# Patient Record
Sex: Female | Born: 1970 | Race: White | Hispanic: No | Marital: Single | Smoking: Current every day smoker
Health system: Southern US, Community
[De-identification: ages and names within clinical notes are randomized; demographics above are authoritative.]

## PROBLEM LIST (undated history)

## (undated) DIAGNOSIS — D739 Disease of spleen, unspecified: Secondary | ICD-10-CM

## (undated) DIAGNOSIS — F419 Anxiety disorder, unspecified: Secondary | ICD-10-CM

## (undated) DIAGNOSIS — R519 Headache, unspecified: Secondary | ICD-10-CM

## (undated) DIAGNOSIS — Z8719 Personal history of other diseases of the digestive system: Secondary | ICD-10-CM

## (undated) DIAGNOSIS — I10 Essential (primary) hypertension: Secondary | ICD-10-CM

## (undated) DIAGNOSIS — G629 Polyneuropathy, unspecified: Secondary | ICD-10-CM

## (undated) DIAGNOSIS — E119 Type 2 diabetes mellitus without complications: Secondary | ICD-10-CM

## (undated) DIAGNOSIS — K529 Noninfective gastroenteritis and colitis, unspecified: Secondary | ICD-10-CM

## (undated) DIAGNOSIS — G5603 Carpal tunnel syndrome, bilateral upper limbs: Secondary | ICD-10-CM

## (undated) DIAGNOSIS — Z8489 Family history of other specified conditions: Secondary | ICD-10-CM

## (undated) DIAGNOSIS — N6091 Unspecified benign mammary dysplasia of right breast: Secondary | ICD-10-CM

## (undated) DIAGNOSIS — E785 Hyperlipidemia, unspecified: Secondary | ICD-10-CM

## (undated) DIAGNOSIS — K611 Rectal abscess: Secondary | ICD-10-CM

## (undated) HISTORY — DX: Hyperlipidemia, unspecified: E78.5

## (undated) HISTORY — PX: INCISION AND DRAINAGE ABSCESS ANAL: SUR669

## (undated) HISTORY — DX: Type 2 diabetes mellitus without complications: E11.9

## (undated) HISTORY — DX: Essential (primary) hypertension: I10

## (undated) HISTORY — PX: WISDOM TOOTH EXTRACTION: SHX21

---

## 2009-02-18 DIAGNOSIS — Z8719 Personal history of other diseases of the digestive system: Secondary | ICD-10-CM

## 2009-02-18 HISTORY — DX: Personal history of other diseases of the digestive system: Z87.19

## 2009-11-10 ENCOUNTER — Ambulatory Visit: Payer: Self-pay | Admitting: Vascular Surgery

## 2010-07-31 ENCOUNTER — Ambulatory Visit
Admission: RE | Admit: 2010-07-31 | Discharge: 2010-07-31 | Disposition: A | Discharge status: Home / Self Care | Payer: BC Managed Care – PPO | Source: Ambulatory Visit | Attending: Family Medicine | Admitting: Family Medicine

## 2010-07-31 ENCOUNTER — Other Ambulatory Visit: Payer: Self-pay | Admitting: Family Medicine

## 2010-07-31 DIAGNOSIS — K921 Melena: Secondary | ICD-10-CM

## 2010-07-31 MED ORDER — IOHEXOL 300 MG/ML  SOLN
125.0000 mL | Freq: Once | INTRAMUSCULAR | Status: AC | PRN
  Administered 2010-07-31: 125 mL via INTRAVENOUS

## 2011-02-07 LAB — HM MAMMOGRAPHY

## 2012-06-17 ENCOUNTER — Encounter: Payer: Self-pay | Admitting: Family Medicine

## 2012-06-17 DIAGNOSIS — K529 Noninfective gastroenteritis and colitis, unspecified: Secondary | ICD-10-CM | POA: Insufficient documentation

## 2012-06-17 DIAGNOSIS — E669 Obesity, unspecified: Secondary | ICD-10-CM | POA: Insufficient documentation

## 2012-06-18 ENCOUNTER — Ambulatory Visit (INDEPENDENT_AMBULATORY_CARE_PROVIDER_SITE_OTHER): Payer: BC Managed Care – PPO | Admitting: Physician Assistant

## 2012-06-18 ENCOUNTER — Encounter: Payer: Self-pay | Admitting: Physician Assistant

## 2012-06-18 VITALS — BP 146/94 | HR 76 | Temp 97.8°F | Resp 18 | Ht 65.0 in | Wt 246.0 lb

## 2012-06-18 DIAGNOSIS — R209 Unspecified disturbances of skin sensation: Secondary | ICD-10-CM

## 2012-06-18 DIAGNOSIS — R202 Paresthesia of skin: Secondary | ICD-10-CM

## 2012-06-18 DIAGNOSIS — G5601 Carpal tunnel syndrome, right upper limb: Secondary | ICD-10-CM

## 2012-06-18 DIAGNOSIS — G5600 Carpal tunnel syndrome, unspecified upper limb: Secondary | ICD-10-CM

## 2012-06-18 DIAGNOSIS — M542 Cervicalgia: Secondary | ICD-10-CM

## 2012-06-18 DIAGNOSIS — B372 Candidiasis of skin and nail: Secondary | ICD-10-CM

## 2012-06-18 LAB — COMPLETE METABOLIC PANEL WITH GFR
ALT: 18 U/L (ref 0–35)
AST: 15 U/L (ref 0–37)
Alkaline Phosphatase: 78 U/L (ref 39–117)
BUN: 9 mg/dL (ref 6–23)
Creat: 0.94 mg/dL (ref 0.50–1.10)
Total Bilirubin: 0.4 mg/dL (ref 0.3–1.2)

## 2012-06-18 LAB — HEMOGLOBIN A1C: Mean Plasma Glucose: 111 mg/dL (ref <117)

## 2012-06-18 LAB — VITAMIN B12: Vitamin B-12: 369 pg/mL (ref 211–911)

## 2012-06-18 MED ORDER — METAXALONE 800 MG PO TABS: 800.0000 mg | ORAL_TABLET | Freq: Four times a day (QID) | ORAL | Status: DC

## 2012-06-18 MED ORDER — FLUCONAZOLE 150 MG PO TABS: 150.0000 mg | ORAL_TABLET | Freq: Once | ORAL | Status: DC

## 2012-06-19 MED ORDER — NYSTATIN 100000 UNIT/GM EX CREA: TOPICAL_CREAM | Freq: Two times a day (BID) | CUTANEOUS | Status: DC

## 2012-06-20 NOTE — Progress Notes (Signed)
Patient ID: Alexandra Christensen MRN: [id number], DOB: [birth year], 42 y.o. Date of Encounter: @DATE @  Chief Complaint:  Chief Complaint  Patient presents with  . c/o shoulder and neck pain  . Rash    under breasts    HPI: 42 y.o. year old female  presents with:  1- Rash under both breasts.   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 High stress-lots of changes, etc. Working lots more.   3-Wonders if she should get breast reduction. Wears 42 DD bra. Has indentions in tops of shoulders. Constant neck and back pain. Cannot jog secondary to pain in breasts with that movement.  4- Weight gain secondary to stress.  5- Numb/tingling in fingers of right hand. Wakes her at night. Feels it when driving, gripping stering wheel. Early morning, has difficulty gripping hair brush or coffee cup.   6-Burning, tingling in toes-Family h/o diabetes. Wants to check for diabetes. Not fasting    History reviewed. No pertinent past medical history.   Home Meds: See attached medication section for current medication list. Any medications entered into computer today will not appear on this note's list. The medications listed below were entered prior to today. No current outpatient prescriptions on file prior to visit.   No current facility-administered medications on file prior to visit.    Allergies: No Known Allergies  History   Social History  . Marital Status: Single    Spouse Name: N/A    Number of Children: N/A  . Years of Education: N/A   Occupational History  . Not on file.   Social History Main Topics  . Smoking status: Current Some Day Smoker  . Smokeless tobacco: Never Used  . Alcohol Use: Yes  . Drug Use: No  . Sexually Active: Not on file   Other Topics Concern  . Not on file   Social History Narrative  . No narrative on file    Family History  Problem Relation Age of Onset  . Diabetes Mother   . Heart disease  Mother   . Kidney disease Father   . Heart disease Father   . Cancer Neg Hx      Review of Systems:  See HPI for pertinent ROS. All other ROS negative.    Physical Exam: Blood pressure 146/94, pulse 76, temperature 97.8 F (36.6 C), temperature source Oral, resp. rate 18, height 5\' 5"  (1.651 m), weight 246 lb (111.585 kg), last menstrual period 06/13/2012., Body mass index is 40.94 kg/(m^2). General: Obese WF.   Appears in no acute distress. Neck: Supple. No thyromegaly. No lymphadenopathy. Tender with palpation of left neck at trapezius. When turns head to right, causes pain, tightness in left neck. Lungs: Clear bilaterally to auscultation without wheezes, rales, or rhonchi. Breathing is unlabored. Heart: RRR with S1 S2. No murmurs, rubs, or gallops. Musculoskeletal:  Strength and tone normal for age.  Extremities/Skin: Bethann Humble der both breasts: diffuse erythema, moist. Breasts are very large. She is wearing support bra but breasts still come down covering about 3 inches of her abdomen. Talens: Negative. Phalens: causes increased tingling in fingers of right hand. Neuro: Alert and oriented X 3. Moves all extremities spontaneously. Gait is normal. CNII-XII grossly in tact. Psych:  Responds to questions appropriately with a normal affect.     ASSESSMENT AND PLAN:  42 y.o. year old female with  1. Skin yeast infection - fluconazole (DIFLUCAN) 150 MG tablet; Take  1 tablet (150 mg total) by mouth once.  Dispense: 1 tablet; Refill: 0 Nystatin cream. Discussed cause. Needs to dry completely fter shower and avoid perspiration to stay t area. Keep area dry.  2. Neck pain Do Rom stretches of neck and shoulders periodically throughout the day. Adjust posture while at computer. Heat.  - metaxalone (SKELAXIN) 800 MG tablet; Take 1 tablet (800 mg total) by mouth 4 (four) times daily.  Dispense: 60 tablet; Refill: 1  3. Paresthesias - Hemoglobin A1c - Vitamin B12 - COMPLETE METABOLIC PANEL WITH  GFR  4. Carpal tunnel syndrome, right Wear splint at night and as much as possible. Nsaids prn.   5. Large Breasts: Told her to contact her insurance company regarding this. I do encourage her to proceed with this. She would benefit from breast reduction surgery.    [address] [place], [place], [facility] 06/20/2012 7:57 AM

## 2012-11-03 ENCOUNTER — Encounter: Payer: Self-pay | Admitting: Family Medicine

## 2013-06-14 ENCOUNTER — Encounter: Payer: Self-pay | Admitting: Family Medicine

## 2013-06-14 ENCOUNTER — Ambulatory Visit (INDEPENDENT_AMBULATORY_CARE_PROVIDER_SITE_OTHER): Payer: BC Managed Care – PPO | Admitting: Family Medicine

## 2013-06-14 VITALS — BP 130/76 | HR 82 | Temp 99.0°F | Resp 16 | Ht 65.0 in | Wt 238.0 lb

## 2013-06-14 DIAGNOSIS — K529 Noninfective gastroenteritis and colitis, unspecified: Secondary | ICD-10-CM

## 2013-06-14 DIAGNOSIS — R109 Unspecified abdominal pain: Secondary | ICD-10-CM

## 2013-06-14 DIAGNOSIS — K5289 Other specified noninfective gastroenteritis and colitis: Secondary | ICD-10-CM

## 2013-06-14 LAB — URINALYSIS, ROUTINE W REFLEX MICROSCOPIC
BILIRUBIN URINE: NEGATIVE
Glucose, UA: NEGATIVE mg/dL
Hgb urine dipstick: NEGATIVE
KETONES UR: NEGATIVE mg/dL
Leukocytes, UA: NEGATIVE
Nitrite: NEGATIVE
PH: 6 (ref 5.0–8.0)
Protein, ur: NEGATIVE mg/dL
Urobilinogen, UA: 0.2 mg/dL (ref 0.0–1.0)

## 2013-06-14 MED ORDER — HYDROCODONE-ACETAMINOPHEN 5-325 MG PO TABS: 1.0000 | ORAL_TABLET | Freq: Four times a day (QID) | ORAL | Status: DC | PRN

## 2013-06-14 NOTE — Progress Notes (Signed)
Patient ID: Alexandra Christensen, female   DOB: [birth year], 43 y.o.   MRN: [id number]   Subjective:    Patient ID: Alexandra Christensen, female    DOB: [birth year], 43 y.o.   MRN: [id number]  Patient presents for Abdominal pain  patient here with abdominal pain for the past week. She does have a history of inflammatory bowel disease/colitis which she last had a flareup of 2012. She's not had any change in her stools but has had left-sided abdominal pain associated with nausea and fever. She's not had any URI symptoms no sinusitis symptoms. Denies any dysuria. She has tried some over-the-counter pain relievers with no improvement.    Review Of Systems:  GEN- denies fatigue, fever, weight loss,weakness, recent illness HEENT- denies eye drainage, change in vision, nasal discharge, CVS- denies chest pain, palpitations RESP- denies SOB, cough, wheeze ABD-+ N/ denies V, change in stools, +abd pain GU- denies dysuria, hematuria, dribbling, incontinence MSK- denies joint pain, muscle aches, injury Neuro- denies headache, dizziness, syncope, seizure activity       Objective:    BP 130/76  Pulse 82  Temp(Src) 99 F (37.2 C) (Oral)  Resp 16  Ht 5\' 5"  (1.651 m)  Wt 238 lb (107.956 kg)  BMI 39.61 kg/m2  LMP 06/03/2013 GEN- NAD, alert and oriented x3 HEENT- PERRL, EOMI, non injected sclera, pink conjunctiva, MMM, oropharynx clear Neck- Supple, no LAD CVS- RRR, no murmur RESP-CTAB ABD-NABS,soft,TTP LUQ/upper LLQ, no rebound, no gaurding, no mass, no CVA tenderness EXT- No edema Pulses- Radial 2+        Assessment & Plan:      Problem List Items Addressed This Visit   Abdominal pain, unspecified site - Primary   Relevant Orders      CBC with Differential      Comprehensive metabolic panel      Urinalysis, Routine w reflex microscopic (Completed)      CT Abdomen Pelvis W Contrast    Other Visit Diagnoses   IBD (inflammatory bowel disease)        Relevant Orders       CT Abdomen Pelvis  W Contrast       Note: This dictation was prepared with Dragon dictation along with smaller phrase technology. Any transcriptional errors that result from this process are unintentional.

## 2013-06-14 NOTE — Assessment & Plan Note (Signed)
Her abdominal pain and fever very concerning and she has a history of colitis and inflammatory bowel disease. She's not had any other signs or sources of infection. I did check a urinalysis which was negative CBC metabolic panel to be done and CT scan of abdomen and pelvis. Have given her prescription of hydrocodone to use for pain.

## 2013-06-14 NOTE — Patient Instructions (Signed)
Take pain medication as needed We will call with results F/U as needed

## 2013-06-14 NOTE — Assessment & Plan Note (Signed)
History of IBD, no recent diarrhea or blood stools

## 2013-06-15 ENCOUNTER — Other Ambulatory Visit: Payer: Self-pay | Admitting: Family Medicine

## 2013-06-15 ENCOUNTER — Ambulatory Visit
Admission: RE | Admit: 2013-06-15 | Discharge: 2013-06-15 | Disposition: A | Discharge status: Home / Self Care | Payer: BC Managed Care – PPO | Source: Ambulatory Visit | Attending: Family Medicine | Admitting: Family Medicine

## 2013-06-15 ENCOUNTER — Telehealth: Payer: Self-pay | Admitting: *Deleted

## 2013-06-15 ENCOUNTER — Other Ambulatory Visit: Payer: BC Managed Care – PPO

## 2013-06-15 DIAGNOSIS — K589 Irritable bowel syndrome without diarrhea: Secondary | ICD-10-CM

## 2013-06-15 DIAGNOSIS — R109 Unspecified abdominal pain: Secondary | ICD-10-CM

## 2013-06-15 DIAGNOSIS — D735 Infarction of spleen: Secondary | ICD-10-CM

## 2013-06-15 DIAGNOSIS — K529 Noninfective gastroenteritis and colitis, unspecified: Secondary | ICD-10-CM

## 2013-06-15 LAB — COMPREHENSIVE METABOLIC PANEL
ALBUMIN: 3.7 g/dL (ref 3.5–5.2)
ALK PHOS: 81 U/L (ref 39–117)
ALT: 9 U/L (ref 0–35)
AST: 9 U/L (ref 0–37)
BUN: 8 mg/dL (ref 6–23)
CO2: 25 mEq/L (ref 19–32)
Calcium: 9.5 mg/dL (ref 8.4–10.5)
Chloride: 103 mEq/L (ref 96–112)
Creat: 0.95 mg/dL (ref 0.50–1.10)
Glucose, Bld: 121 mg/dL — ABNORMAL HIGH (ref 70–99)
POTASSIUM: 4 meq/L (ref 3.5–5.3)
Sodium: 139 mEq/L (ref 135–145)
Total Bilirubin: 0.4 mg/dL (ref 0.2–1.2)
Total Protein: 6.5 g/dL (ref 6.0–8.3)

## 2013-06-15 LAB — CBC WITH DIFFERENTIAL/PLATELET
BASOS ABS: 0 10*3/uL (ref 0.0–0.1)
BASOS PCT: 0 % (ref 0–1)
Eosinophils Absolute: 0.3 10*3/uL (ref 0.0–0.7)
Eosinophils Relative: 2 % (ref 0–5)
HEMATOCRIT: 39.7 % (ref 36.0–46.0)
Hemoglobin: 13.8 g/dL (ref 12.0–15.0)
Lymphocytes Relative: 18 % (ref 12–46)
Lymphs Abs: 2.5 10*3/uL (ref 0.7–4.0)
MCH: 30.1 pg (ref 26.0–34.0)
MCHC: 34.8 g/dL (ref 30.0–36.0)
MCV: 86.5 fL (ref 78.0–100.0)
MONO ABS: 0.7 10*3/uL (ref 0.1–1.0)
Monocytes Relative: 5 % (ref 3–12)
NEUTROS ABS: 10.3 10*3/uL — AB (ref 1.7–7.7)
NEUTROS PCT: 75 % (ref 43–77)
PLATELETS: 375 10*3/uL (ref 150–400)
RBC: 4.59 MIL/uL (ref 3.87–5.11)
RDW: 14.5 % (ref 11.5–15.5)
WBC: 13.7 10*3/uL — ABNORMAL HIGH (ref 4.0–10.5)

## 2013-06-15 MED ORDER — IOHEXOL 300 MG/ML  SOLN
125.0000 mL | Freq: Once | INTRAMUSCULAR | Status: AC | PRN
  Administered 2013-06-15: 125 mL via INTRAVENOUS

## 2013-06-15 NOTE — Telephone Encounter (Signed)
[facility] [place] [facility]. [phone])  Dr. 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 Dr. Rennis GoldenHilty is scheduled to do on 4/29 at 10:00 am.  Pt notified and given instructions. [facility] with date and time of procedure.

## 2013-06-16 ENCOUNTER — Encounter (HOSPITAL_COMMUNITY)
Admission: RE | Disposition: A | Discharge status: Home / Self Care | Payer: Self-pay | Source: Ambulatory Visit | Attending: Internal Medicine

## 2013-06-16 ENCOUNTER — Other Ambulatory Visit: Payer: Self-pay | Admitting: *Deleted

## 2013-06-16 ENCOUNTER — Encounter (HOSPITAL_COMMUNITY): Payer: Self-pay

## 2013-06-16 ENCOUNTER — Ambulatory Visit (HOSPITAL_COMMUNITY)
Admission: RE | Admit: 2013-06-16 | Discharge: 2013-06-16 | Disposition: A | Discharge status: Home / Self Care | Payer: BC Managed Care – PPO | Source: Ambulatory Visit | Attending: Internal Medicine | Admitting: Internal Medicine

## 2013-06-16 DIAGNOSIS — D7389 Other diseases of spleen: Secondary | ICD-10-CM | POA: Insufficient documentation

## 2013-06-16 DIAGNOSIS — Z01818 Encounter for other preprocedural examination: Secondary | ICD-10-CM

## 2013-06-16 DIAGNOSIS — D735 Infarction of spleen: Secondary | ICD-10-CM | POA: Diagnosis present

## 2013-06-16 DIAGNOSIS — I059 Rheumatic mitral valve disease, unspecified: Secondary | ICD-10-CM

## 2013-06-16 HISTORY — PX: TEE WITHOUT CARDIOVERSION: SHX5443

## 2013-06-16 LAB — PATHOLOGIST SMEAR REVIEW

## 2013-06-16 LAB — PROTIME-INR
INR: 1.02 (ref <1.50)
PROTHROMBIN TIME: 13.3 s (ref 11.6–15.2)

## 2013-06-16 SURGERY — ECHOCARDIOGRAM, TRANSESOPHAGEAL
Anesthesia: Moderate Sedation

## 2013-06-16 MED ORDER — FENTANYL CITRATE 0.05 MG/ML IJ SOLN
INTRAMUSCULAR | Status: DC | PRN
  Administered 2013-06-16 (×2): 25 ug via INTRAVENOUS

## 2013-06-16 MED ORDER — LIDOCAINE VISCOUS 2 % MT SOLN
OROMUCOSAL | Status: AC
  Filled 2013-06-16: qty 15

## 2013-06-16 MED ORDER — SODIUM CHLORIDE 0.9 % IV SOLN: INTRAVENOUS | Status: DC

## 2013-06-16 MED ORDER — LIDOCAINE VISCOUS 2 % MT SOLN
OROMUCOSAL | Status: DC | PRN
  Administered 2013-06-16: 10 mL via OROMUCOSAL

## 2013-06-16 MED ORDER — MIDAZOLAM HCL 10 MG/2ML IJ SOLN
INTRAMUSCULAR | Status: DC | PRN
  Administered 2013-06-16: 1 mg via INTRAVENOUS
  Administered 2013-06-16: 2 mg via INTRAVENOUS
  Administered 2013-06-16 (×2): 1 mg via INTRAVENOUS

## 2013-06-16 MED ORDER — MIDAZOLAM HCL 5 MG/ML IJ SOLN
INTRAMUSCULAR | Status: AC
  Filled 2013-06-16: qty 2

## 2013-06-16 MED ORDER — FENTANYL CITRATE 0.05 MG/ML IJ SOLN
INTRAMUSCULAR | Status: AC
  Filled 2013-06-16: qty 2

## 2013-06-16 NOTE — Discharge Instructions (Signed)
Transesophageal Echocardiography °Transesophageal echocardiography (TEE) is a special type of test that produces images of the heart by using sound waves (echocardiogram). This type of echocardiography can obtain better images of the heart than standard echocardiography. TEE is done by passing a flexible tube down the esophagus. The heart is located in front of the esophagus. Because the heart and esophagus are close to one another, your health care provider can take very clear, detailed pictures of the heart via ultrasound waves. °TEE may be done: °· If your health care provider needs more information based on standard echocardiography findings. °· If you had a stroke. This might have happened because a clot formed in your heart. TEE can visualize different areas of the heart and check for clots. °· To check valve anatomy and function. °· To check for infection on the inside of your heart (endocarditis). °· To evaluate the dividing wall (septum) of the heart and presence of a hole that did not close after birth (patent foramen ovale or atrial septal defect). °· To help diagnose a tear in the wall of the aorta (aortic dissection). °· During cardiac valve surgery. This allows the surgeon to assess the valve repair before closing the chest. °· During a variety of other cardiac procedures to guide positioning of catheters. °· Sometimes before a cardioversion, which is a shock to convert heart rhythm back to normal. °LET YOUR HEALTH CARE PROVIDER KNOW ABOUT:  °· Any allergies you have. °· All medicines you are taking, including vitamins, herbs, eye drops, creams, and over-the-counter medicines. °· Previous problems you or members of your family have had with the use of anesthetics. °· Any blood disorders you have. °· Previous surgeries you have had. °· Medical conditions you have. °· Swallowing difficulties. °· An esophageal obstruction. °RISKS AND COMPLICATIONS  °Generally, TEE is a safe procedure. However, as with any  procedure, complications can occur. Possible complications include an esophageal tear (rupture). °BEFORE THE PROCEDURE  °· Do not eat or drink for 6 hours before the procedure or as directed by your health care provider. °· Arrange for someone to drive you home after the procedure. Do not drive yourself home. During the procedure, you will be given medicines that can continue to make you feel drowsy and can impair your reflexes. °· An IV access tube will be started in the arm. °PROCEDURE  °· A medicine to help you relax (sedative) will be given through the IV access tube. °· A medicine that numbs the area (local anesthetic) may be sprayed in the back of the throat. °· Your blood pressure, heart rate, and breathing (vital signs) will be monitored during the procedure. °· The TEE probe is a long, flexible tube. The tip of the probe is placed into the back of the mouth, and you will be asked to swallow. This helps to pass the tip of the probe into the esophagus. Once the tip of the probe is in the correct area, your health care provider can take pictures of the heart. °· TEE is usually not a painful procedure. You may feel the probe press against the back of the throat. The probe does not enter the trachea and does not affect your breathing. °· Your time spent at the hospital is usually less than 2 hours. °AFTER THE PROCEDURE  °· You will be in bed, resting, until you have fully returned to consciousness. °· When you first awaken, your throat may feel slightly sore and will probably still feel numb. This will   improve slowly over time.  You will not be allowed to eat or drink until it is clear that the numbness has improved.  Once you have been able to drink, urinate, and sit on the edge of the bed without feeling sick to your stomach (nausea) or dizzy, you may be cleared to go home.  You should have a friend or family member with you for the next 24 hours after your procedure. Document Released: 04/27/2002  Document Revised: 11/25/2012 Document Reviewed: 08/06/2012 [facility] Patient Information 2014 [place], [place]. Conscious Sedation, Adult, Care After Refer to this sheet in the next few weeks. These instructions provide you with information on caring for yourself after your procedure. Your health care provider may also give you more specific instructions. Your treatment has been planned according to current medical practices, but problems sometimes occur. Call your health care provider if you have any problems or questions after your procedure. WHAT TO EXPECT AFTER THE PROCEDURE  After your procedure:  You may feel sleepy, clumsy, and have poor balance for several hours.  Vomiting may occur if you eat too soon after the procedure. HOME CARE INSTRUCTIONS  Do not participate in any activities where you could become injured for at least 24 hours. Do not:  Drive.  Swim.  Ride a bicycle.  Operate heavy machinery.  Cook.  Use power tools.  Climb ladders.  Work from a high place.  Do not make important decisions or sign legal documents until you are improved.  If you vomit, drink water, juice, or soup when you can drink without vomiting. Make sure you have little or no nausea before eating solid foods.  Only take over-the-counter or prescription medicines for pain, discomfort, or fever as directed by your health care provider.  Make sure you and your family fully understand everything about the medicines given to you, including what side effects may occur.  You should not drink alcohol, take sleeping pills, or take medicines that cause drowsiness for at least 24 hours.  If you smoke, do not smoke without supervision.  If you are feeling better, you may resume normal activities 24 hours after you were sedated.  Keep all appointments with your health care provider. SEEK MEDICAL CARE IF:  Your skin is pale or bluish in color.  You continue to feel nauseous or vomit.  Your pain  is getting worse and is not helped by medicine.  You have bleeding or swelling.  You are still sleepy or feeling clumsy after 24 hours. SEEK IMMEDIATE MEDICAL CARE IF:  You develop a rash.  You have difficulty breathing.  You develop any type of allergic problem.  You have a fever. MAKE SURE YOU:  Understand these instructions.  Will watch your condition.  Will get help right away if you are not doing well or get worse. Document Released: 11/25/2012 Document Reviewed: 09/11/2012 [facility] Patient Information 2014 [place], [place].

## 2013-06-16 NOTE — H&P (Signed)
     INTERVAL PROCEDURE H&P  History and Physical Interval Note:  06/16/2013 10:04 AM  Alexandra Christensen has presented today for their planned procedure. The various methods of treatment have been discussed with the patient and family. After consideration of risks, benefits and other options for treatment, the patient has consented to the procedure.  The patients' outpatient history has been reviewed, patient examined, and no change in status from most recent office note within the past 30 days. I have reviewed the patients' chart and labs and will proceed as planned. Questions were answered to the patient's satisfaction.   Alexandra NoseKenneth C. Taiten Brawn, MD, [facility] Attending Cardiologist [facility]  Alexandra NoseKenneth C. Alexandra Christensen 06/16/2013, 10:04 AM

## 2013-06-16 NOTE — Progress Notes (Signed)
  Echocardiogram Echocardiogram Transesophageal has been performed.  [name] 06/16/2013, 11:01 AM

## 2013-06-16 NOTE — CV Procedure (Signed)
    TRANSESOPHAGEAL ECHOCARDIOGRAM (TEE) NOTE  INDICATIONS: splenic infarct, evaluate for source of embolism  PROCEDURE:   Informed consent was obtained prior to the procedure. The risks, benefits and alternatives for the procedure were discussed and the patient comprehended these risks.  Risks include, but are not limited to, cough, sore throat, vomiting, nausea, somnolence, esophageal and stomach trauma or perforation, bleeding, low blood pressure, aspiration, pneumonia, infection, trauma to the teeth and death.    After a procedural time-out, the patient was given 6 mg versed and 50 mcg fentanyl for moderate sedation.  The oropharynx was anesthetized 10 cc of topical 1% viscous lidocaine and 1 cetacaine spray.  The transesophageal probe was inserted in the esophagus and stomach without difficulty and multiple views were obtained.  The patient was kept under observation until the patient left the procedure room.  The patient left the procedure room in stable condition.   Agitated microbubble saline contrast was administered.  COMPLICATIONS:    There were no immediate complications.  Findings:  1. LEFT VENTRICLE: The left ventricular wall thickness is normal.  The left ventricular cavity is normal in size. Wall motion is normal.  LVEF is 55-60%.  2. RIGHT VENTRICLE:  The right ventricle is normal in structure and function without any thrombus or masses.    3. LEFT ATRIUM:  The left atrium is normal in size without any thrombus or masses.  There is not spontaneous echo contrast ("smoke") in the left atrium consistent with a low flow state.  4. LEFT ATRIAL APPENDAGE:  The left atrial appendage is free of any thrombus or masses. The appendage has single lobes. Pulse doppler indicates moderate flow in the appendage.  5. ATRIAL SEPTUM:  The atrial septum appears intact and is free of thrombus and/or masses.  There is no evidence for interatrial shunting by color doppler and saline  microbubble.  6. RIGHT ATRIUM:  The right atrium is normal in size and function without any thrombus or masses.  7. MITRAL VALVE:  The mitral valve is normal in structure and function with Mild regurgitation.  There were no vegetations or stenosis.  8. AORTIC VALVE:  The aortic valve is normal in structure and function with no regurgitation.  There were no vegetations or stenosis  9. TRICUSPID VALVE:  The tricuspid valve is normal in structure and function with trivial regurgitation.  There were no vegetations or stenosis  10.  PULMONIC VALVE:  The pulmonic valve is normal in structure and function with no regurgitation.  There were no vegetations or stenosis.   11. AORTIC ARCH, ASCENDING AND DESCENDING AORTA:  There was no atherosclerosis of the ascending aorta, aortic arch, or proximal descending aorta.  12.  PULMONARY VEINS: No anomalous pulmonary vein connections were noted.  IMPRESSION:   1.  Normal TEE - no cardiac source of embolus identified.  RECOMMENDATIONS:    1.  Further work-up of splenic infarcts per her PCP.  Time Spent Directly with the Patient:  60 minutes   [name] Liona Wengert, MD, [facility]ACC Attending Cardiologist [facility]  06/16/2013, 10:50 AM

## 2013-06-17 ENCOUNTER — Other Ambulatory Visit: Payer: Self-pay | Admitting: *Deleted

## 2013-06-17 ENCOUNTER — Encounter (HOSPITAL_COMMUNITY): Payer: Self-pay | Admitting: Internal Medicine

## 2013-06-17 ENCOUNTER — Telehealth: Payer: Self-pay | Admitting: *Deleted

## 2013-06-17 DIAGNOSIS — D735 Infarction of spleen: Secondary | ICD-10-CM

## 2013-06-17 DIAGNOSIS — R509 Fever, unspecified: Secondary | ICD-10-CM

## 2013-06-17 LAB — PROTEIN ELECTROPHORESIS, SERUM
ALPHA-2-GLOBULIN: 17.4 % — AB (ref 7.1–11.8)
Albumin ELP: 50 % — ABNORMAL LOW (ref 55.8–66.1)
Alpha-1-Globulin: 6.9 % — ABNORMAL HIGH (ref 2.9–4.9)
Beta 2: 6.3 % (ref 3.2–6.5)
Beta Globulin: 6.1 % (ref 4.7–7.2)
Gamma Globulin: 13.3 % (ref 11.1–18.8)
Total Protein, Serum Electrophoresis: 6.9 g/dL (ref 6.0–8.3)

## 2013-06-17 NOTE — Telephone Encounter (Signed)
Received call from [facility].   Reports that Peer to Peer review of services required for TEE.   Requested to have MD call back with review before case closed on 06/18/2013 @5pm .   Contact number: [phone]

## 2013-06-17 NOTE — Telephone Encounter (Signed)
Call placed to patient and patient made aware.   Orders placed.  

## 2013-06-17 NOTE — Telephone Encounter (Signed)
I have done this from home, Alexandra LeeSabrina has the authorization number  Please call pt, her TEE looked good, no sign of infection around her heart, her blood work is still pending, but I Left off a blood test--- Have her come in and draw Blood Culture x2   Dx- splenic infarct, fever

## 2013-06-18 ENCOUNTER — Other Ambulatory Visit: Payer: BC Managed Care – PPO

## 2013-06-22 ENCOUNTER — Other Ambulatory Visit: Payer: Self-pay | Admitting: Family Medicine

## 2013-06-22 DIAGNOSIS — D735 Infarction of spleen: Secondary | ICD-10-CM

## 2013-06-22 NOTE — Progress Notes (Signed)
Patient ID: Alexandra Christensen, female   DOB: 1970/11/28, 43 y.o.   MRN: [id number] error

## 2013-06-23 ENCOUNTER — Other Ambulatory Visit: Payer: Self-pay | Admitting: *Deleted

## 2013-06-23 ENCOUNTER — Telehealth: Payer: Self-pay | Admitting: Family Medicine

## 2013-06-23 MED ORDER — ALPRAZOLAM 0.5 MG PO TABS: 0.5000 mg | ORAL_TABLET | Freq: Every evening | ORAL | Status: DC | PRN

## 2013-06-23 NOTE — Telephone Encounter (Signed)
Discussed results with pt.  She has 1 fever 99.5 F last week, no LUQ pain now Feels much better Request xanax which she has been on to help her sleep  Referral to hematology to see if she needs anti-agulation She was out of town therefore blood cultures not collected, since she did have another fever will collect when she brings in UPEP sample

## 2013-06-23 NOTE — Telephone Encounter (Signed)
Per orders from MD, xanax prescription sent to pharmacy.

## 2013-06-23 NOTE — Telephone Encounter (Signed)
Please verify Xanax dosage.   [facility] contacted and states that patient has not had medication filled with them.

## 2013-06-25 ENCOUNTER — Ambulatory Visit: Payer: Self-pay | Admitting: Internal Medicine

## 2013-06-30 LAB — CBC CANCER CENTER
BANDS NEUTROPHIL: 1 %
Basophil: 1 %
Eosinophil: 5 %
HCT: 42.8 % (ref 35.0–47.0)
HGB: 14.1 g/dL (ref 12.0–16.0)
Lymphocytes: 20 %
MCH: 29.5 pg (ref 26.0–34.0)
MCHC: 33 g/dL (ref 32.0–36.0)
MCV: 89 fL (ref 80–100)
MONOS PCT: 2 %
PLATELETS: 284 x10 3/mm (ref 150–440)
RBC: 4.79 10*6/uL (ref 3.80–5.20)
RDW: 13.9 % (ref 11.5–14.5)
Segmented Neutrophils: 67 %
VARIANT LYMPHOCYTE - H4-RLYMPH: 4 %
WBC: 11.3 x10 3/mm — AB (ref 3.6–11.0)

## 2013-06-30 LAB — LACTATE DEHYDROGENASE: LDH: 151 U/L (ref 81–246)

## 2013-07-01 LAB — CEA: CEA: 3.1 ng/mL (ref 0.0–4.7)

## 2013-07-01 LAB — CA 125: CA 125: 10.1 U/mL (ref 0.0–34.0)

## 2013-07-01 LAB — CANCER ANTIGEN 19-9: CA 19-9: 4 U/mL (ref 0–35)

## 2013-07-01 LAB — KAPPA/LAMBDA FREE LIGHT CHAINS (ARMC)

## 2013-07-13 ENCOUNTER — Encounter: Payer: Self-pay | Admitting: Family Medicine

## 2013-07-19 ENCOUNTER — Ambulatory Visit: Payer: Self-pay | Admitting: Internal Medicine

## 2013-07-23 ENCOUNTER — Telehealth: Payer: Self-pay | Admitting: Family Medicine

## 2013-07-23 MED ORDER — APIXABAN 2.5 MG PO TABS: 2.5000 mg | ORAL_TABLET | Freq: Two times a day (BID) | ORAL | Status: DC

## 2013-07-23 NOTE — Telephone Encounter (Signed)
Patient returned call and made aware.

## 2013-07-23 NOTE — Telephone Encounter (Signed)
Call pt, I reviewed hematology notes Like we discussed I am going to put her on a low dose blood thinner, called Eliquis, she does not need any lab monitoring with this. Risk include easy bruising and bleeding especially if she injurs her self  I have a co-pay card she can activate to bring cost down to $10/month We will treat for 6 months She will also need repeat CT scan in October to see if splenic infarcts have improved or if anything changes  We have samples of the eliquis to get her started, she can take 1/2 tablet twice a day

## 2013-07-23 NOTE — Telephone Encounter (Signed)
LMTRC

## 2013-10-20 ENCOUNTER — Telehealth: Payer: Self-pay | Admitting: Family Medicine

## 2013-10-20 NOTE — Telephone Encounter (Signed)
[phone]  Pt is suppose to have another CT done in October and she is wanting to know if she needs to schedule that or do we?  IF she needs to schedule this can you please give her the number and who she needs to schedule with.

## 2013-10-22 NOTE — Telephone Encounter (Signed)
LMTRC

## 2013-10-26 NOTE — Telephone Encounter (Signed)
LMTRC

## 2013-10-29 NOTE — Telephone Encounter (Signed)
Pt called back but left message to return call, returned call but no answer left message to return call

## 2013-12-09 ENCOUNTER — Ambulatory Visit: Payer: BC Managed Care – PPO | Admitting: Family Medicine

## 2014-01-06 ENCOUNTER — Telehealth: Payer: Self-pay | Admitting: *Deleted

## 2014-01-06 ENCOUNTER — Other Ambulatory Visit: Payer: Self-pay | Admitting: Family Medicine

## 2014-01-06 DIAGNOSIS — Z09 Encounter for follow-up examination after completed treatment for conditions other than malignant neoplasm: Secondary | ICD-10-CM

## 2014-01-06 DIAGNOSIS — Z8719 Personal history of other diseases of the digestive system: Secondary | ICD-10-CM

## 2014-01-06 NOTE — Telephone Encounter (Signed)
Pt called stating she is needing a follow up CT scan abd/pelvis, states she is due for it and would like for it to be done soon. I placed order for CT scan in referral workqueue

## 2014-01-06 NOTE — Telephone Encounter (Signed)
[facility] needing [facility] to peer review to get authorization to have follow up CT scan abd/pelvis done. Please call the number [phone] and follow the prompts.

## 2014-01-06 NOTE — Telephone Encounter (Signed)
Her last office visit here was with Dr. Jeanice Limurham on 06/14/2013. At that visit, Dr. Jeanice Limurham ordered a CT abdomen pelvis. I reviewed the findings of that CT abdomen pelvis.  Tell patient that she needs to have a follow-up office visit with Dr. Jeanice Limurham. CT scan cna not be ordered until she has a follow-up appointment with Dr. Jeanice Limurham.

## 2014-01-07 NOTE — Telephone Encounter (Signed)
Schedule appt for pt, needs OV first before CT can be ordered

## 2014-01-10 NOTE — Telephone Encounter (Signed)
Tried calling pt no answer, pt voice mal not set up

## 2014-01-10 NOTE — Telephone Encounter (Signed)
Pt coming in for follow up in order to have CT scan done

## 2014-01-11 ENCOUNTER — Ambulatory Visit: Payer: BC Managed Care – PPO

## 2014-01-12 ENCOUNTER — Encounter: Payer: Self-pay | Admitting: Family Medicine

## 2014-01-12 ENCOUNTER — Ambulatory Visit (INDEPENDENT_AMBULATORY_CARE_PROVIDER_SITE_OTHER): Payer: BC Managed Care – PPO | Admitting: Family Medicine

## 2014-01-12 VITALS — BP 128/64 | HR 76 | Temp 98.3°F | Resp 14 | Ht 65.0 in | Wt 257.0 lb

## 2014-01-12 DIAGNOSIS — F5102 Adjustment insomnia: Secondary | ICD-10-CM

## 2014-01-12 DIAGNOSIS — R51 Headache: Secondary | ICD-10-CM

## 2014-01-12 DIAGNOSIS — R519 Headache, unspecified: Secondary | ICD-10-CM

## 2014-01-12 DIAGNOSIS — Z23 Encounter for immunization: Secondary | ICD-10-CM

## 2014-01-12 DIAGNOSIS — D735 Infarction of spleen: Secondary | ICD-10-CM

## 2014-01-12 DIAGNOSIS — E669 Obesity, unspecified: Secondary | ICD-10-CM

## 2014-01-12 MED ORDER — NYSTATIN 100000 UNIT/GM EX CREA: TOPICAL_CREAM | Freq: Two times a day (BID) | CUTANEOUS | Status: DC

## 2014-01-12 MED ORDER — ALPRAZOLAM 1 MG PO TABS: 1.0000 mg | ORAL_TABLET | Freq: Every evening | ORAL | Status: DC | PRN

## 2014-01-12 MED ORDER — IBUPROFEN 800 MG PO TABS: 800.0000 mg | ORAL_TABLET | Freq: Three times a day (TID) | ORAL | Status: DC | PRN

## 2014-01-12 NOTE — Assessment & Plan Note (Signed)
Stress and lack of sleep, more tension related, per above for sleep, ibuprofen 600mg 

## 2014-01-12 NOTE — Patient Instructions (Addendum)
Xanax increased to 1mg  at bedtime Flu shot CT for spleen Fasting for labs next week Take ibuprofen 800mg  F/U 3 months

## 2014-01-12 NOTE — Assessment & Plan Note (Signed)
Discussed weight gain, check TSH and glucose

## 2014-01-12 NOTE — Assessment & Plan Note (Signed)
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

## 2014-01-12 NOTE — Assessment & Plan Note (Signed)
Xanax increased to 1 mg

## 2014-01-12 NOTE — Progress Notes (Signed)
Patient ID: Alexandra Christensen, female   DOB: [birth year], 43 y.o.   MRN: [id number]   Subjective:    Patient ID: Alexandra Stainsandace L Christensen, female    DOB: [birth year], 43 y.o.   MRN: [id number]  Patient presents for F/U  patient here for follow-up. She is due for repeat CT scan of her spleen as she had a splenic infarct of unknown origin. She was seen by hematology with negative workup. She's not had any left upper quadrant pain.  She does complain of fatigue headaches daily she is not relaxing very well she has gained 20 pounds in the past 7 months. She is taking her Xanax 0.5 mg which helps some but wears off she does not sleep very well which contributes to her headaches. She typically takes Tylenol or ibuprofen. She is not exercising like she previously did 4-5 times a week and she has not been watching her diet.    Review Of Systems:  GEN- +fatigue, fever, weight loss,weakness, recent illness HEENT- denies eye drainage, change in vision, nasal discharge, CVS- denies chest pain, palpitations RESP- denies SOB, cough, wheeze ABD- denies N/V, change in stools, abd pain GU- denies dysuria, hematuria, dribbling, incontinence MSK- denies joint pain, muscle aches, injury Neuro- +headache, dizziness, syncope, seizure activity       Objective:    BP 128/64 mmHg  Pulse 76  Temp(Src) 98.3 F (36.8 C) (Oral)  Resp 14  Ht 5\' 5"  (1.651 m)  Wt 257 lb (116.574 kg)  BMI 42.77 kg/m2 GEN- NAD, alert and oriented x3, weight gain  HEENT- PERRL, EOMI, non injected sclera, pink conjunctiva, MMM, oropharynx clear Neck- Supple, no thyromegaly CVS- RRR, no murmur RESP-CTAB ABD-NABS,soft,NT,ND EXT- No edema Psych- normal affect and mood Pulses- Radial 2+        Assessment & Plan:      Problem List Items Addressed This Visit    Splenic infarct - Primary   Obesity    Other Visit Diagnoses    Need for prophylactic vaccination and inoculation against influenza        Relevant Orders       Flu  Vaccine QUAD 36+ mos IM (Completed)       Note: This dictation was prepared with Dragon dictation along with smaller phrase technology. Any transcriptional errors that result from this process are unintentional.

## 2014-01-17 ENCOUNTER — Other Ambulatory Visit: Payer: BC Managed Care – PPO

## 2014-01-17 ENCOUNTER — Other Ambulatory Visit: Payer: Self-pay | Admitting: Family Medicine

## 2014-01-17 LAB — LIPID PANEL
CHOL/HDL RATIO: 5 ratio
CHOLESTEROL: 195 mg/dL (ref 0–200)
HDL: 39 mg/dL — AB (ref >39)
LDL Cholesterol: 104 mg/dL — ABNORMAL HIGH (ref 0–99)
Triglycerides: 262 mg/dL — ABNORMAL HIGH (ref <150)
VLDL: 52 mg/dL — ABNORMAL HIGH (ref 0–40)

## 2014-01-17 LAB — COMPREHENSIVE METABOLIC PANEL
ALBUMIN: 3.7 g/dL (ref 3.5–5.2)
ALT: 15 U/L (ref 0–35)
AST: 12 U/L (ref 0–37)
Alkaline Phosphatase: 71 U/L (ref 39–117)
BUN: 13 mg/dL (ref 6–23)
CALCIUM: 9.3 mg/dL (ref 8.4–10.5)
CO2: 25 meq/L (ref 19–32)
CREATININE: 0.95 mg/dL (ref 0.50–1.10)
Chloride: 104 mEq/L (ref 96–112)
GLUCOSE: 112 mg/dL — AB (ref 70–99)
Potassium: 4.4 mEq/L (ref 3.5–5.3)
Sodium: 140 mEq/L (ref 135–145)
Total Bilirubin: 0.3 mg/dL (ref 0.2–1.2)
Total Protein: 6.3 g/dL (ref 6.0–8.3)

## 2014-01-17 LAB — CBC WITH DIFFERENTIAL/PLATELET
Basophils Absolute: 0 10*3/uL (ref 0.0–0.1)
Basophils Relative: 0 % (ref 0–1)
EOS ABS: 0.5 10*3/uL (ref 0.0–0.7)
Eosinophils Relative: 4 % (ref 0–5)
HEMATOCRIT: 38.8 % (ref 36.0–46.0)
HEMOGLOBIN: 13.5 g/dL (ref 12.0–15.0)
LYMPHS PCT: 21 % (ref 12–46)
Lymphs Abs: 2.4 10*3/uL (ref 0.7–4.0)
MCH: 30.4 pg (ref 26.0–34.0)
MCHC: 34.8 g/dL (ref 30.0–36.0)
MCV: 87.4 fL (ref 78.0–100.0)
MONO ABS: 0.6 10*3/uL (ref 0.1–1.0)
MONOS PCT: 5 % (ref 3–12)
MPV: 10.5 fL (ref 9.4–12.4)
Neutro Abs: 8.1 10*3/uL — ABNORMAL HIGH (ref 1.7–7.7)
Neutrophils Relative %: 70 % (ref 43–77)
PLATELETS: 332 10*3/uL (ref 150–400)
RBC: 4.44 MIL/uL (ref 3.87–5.11)
RDW: 13.9 % (ref 11.5–15.5)
WBC: 11.5 10*3/uL — AB (ref 4.0–10.5)

## 2014-01-17 LAB — TSH: TSH: 2.745 u[IU]/mL (ref 0.350–4.500)

## 2014-01-18 LAB — HEMOGLOBIN A1C
Hgb A1c MFr Bld: 6.2 % — ABNORMAL HIGH (ref <5.7)
Mean Plasma Glucose: 131 mg/dL — ABNORMAL HIGH (ref <117)

## 2014-01-19 ENCOUNTER — Telehealth: Payer: Self-pay | Admitting: Family Medicine

## 2014-01-19 DIAGNOSIS — D735 Infarction of spleen: Secondary | ICD-10-CM

## 2014-01-19 NOTE — Telephone Encounter (Signed)
[facility] called CT order needs to be changed to CT abdomen only with contrast.  That is what insurance is approving.  Approval number [id number] good thru 02/04/14.  Order changed and [facility] aware

## 2014-01-21 ENCOUNTER — Other Ambulatory Visit: Payer: BC Managed Care – PPO

## 2014-01-21 ENCOUNTER — Ambulatory Visit
Admission: RE | Admit: 2014-01-21 | Discharge: 2014-01-21 | Disposition: A | Discharge status: Home / Self Care | Payer: BC Managed Care – PPO | Source: Ambulatory Visit | Attending: Family Medicine | Admitting: Family Medicine

## 2014-01-21 DIAGNOSIS — D735 Infarction of spleen: Secondary | ICD-10-CM

## 2014-01-21 MED ORDER — IOHEXOL 300 MG/ML  SOLN
100.0000 mL | Freq: Once | INTRAMUSCULAR | Status: AC | PRN
  Administered 2014-01-21: 100 mL via INTRAVENOUS

## 2014-01-25 ENCOUNTER — Telehealth: Payer: Self-pay | Admitting: Family Medicine

## 2014-01-25 NOTE — Telephone Encounter (Signed)
Please see Imaging notes.

## 2014-01-25 NOTE — Telephone Encounter (Signed)
Patient is calling to get results of her ct scan done  Please call her at [phone]

## 2014-02-09 ENCOUNTER — Ambulatory Visit: Payer: BC Managed Care – PPO | Admitting: Family Medicine

## 2014-04-15 ENCOUNTER — Encounter: Payer: Self-pay | Admitting: Family Medicine

## 2014-04-15 ENCOUNTER — Ambulatory Visit (INDEPENDENT_AMBULATORY_CARE_PROVIDER_SITE_OTHER): Payer: BC Managed Care – PPO | Admitting: Family Medicine

## 2014-04-15 VITALS — BP 132/80 | HR 72 | Temp 98.1°F | Resp 14 | Ht 65.0 in | Wt 261.0 lb

## 2014-04-15 DIAGNOSIS — E781 Pure hyperglyceridemia: Secondary | ICD-10-CM | POA: Insufficient documentation

## 2014-04-15 DIAGNOSIS — E669 Obesity, unspecified: Secondary | ICD-10-CM

## 2014-04-15 DIAGNOSIS — R7302 Impaired glucose tolerance (oral): Secondary | ICD-10-CM

## 2014-04-15 DIAGNOSIS — F5102 Adjustment insomnia: Secondary | ICD-10-CM

## 2014-04-15 DIAGNOSIS — E1149 Type 2 diabetes mellitus with other diabetic neurological complication: Secondary | ICD-10-CM | POA: Insufficient documentation

## 2014-04-15 NOTE — Progress Notes (Signed)
Patient ID: Alexandra StainsCandace L Christensen, female   DOB: 11-22-1970, 44 y.o.   MRN: [id number]   Subjective:    Patient ID: Alexandra Stainsandace L Christensen, female    DOB: 11-22-1970, 44 y.o.   MRN: [id number]  Patient presents for 3 month F/U  patient here to follow-up medications. She was here for fasting labs but she is one week early. Her last set of lab showed hypertriglyceridemia as well as borderline diabetes. Unfortunately she has not lost any significant weight. She states that she is now dedicated to changes her diet as she does not want to go on medication she also has a family history of diabetes. She's not been exercising on a regular basis to place starting at home workout routine.  Regarding her insomnia she does use her Xanax at bedtime typically 2-3 times a week    Review Of Systems:  GEN- denies fatigue, fever, weight loss,weakness, recent illness HEENT- denies eye drainage, change in vision, nasal discharge, CVS- denies chest pain, palpitations RESP- denies SOB, cough, wheeze ABD- denies N/V, change in stools, abd pain GU- denies dysuria, hematuria, dribbling, incontinence MSK- denies joint pain, muscle aches, injury Neuro- denies headache, dizziness, syncope, seizure activity       Objective:    BP 132/80 mmHg  Pulse 72  Temp(Src) 98.1 F (36.7 C) (Oral)  Resp 14  Ht 5\' 5"  (1.651 m)  Wt 261 lb (118.389 kg)  BMI 43.43 kg/m2  LMP 03/10/2014 (Approximate) GEN- NAD, alert and oriented x3 HEENT- PERRL, EOMI, non injected sclera, pink conjunctiva, MMM, oropharynx clear CVS- RRR, no murmur RESP-CTAB ABD-NABS,soft,NT,ND, No HSM EXT- No edema Pulses- Radial  2+        Assessment & Plan:      Problem List Items Addressed This Visit      Unprioritized   Obesity   Insomnia due to stress   Hypertriglyceridemia - Primary   Glucose intolerance (impaired glucose tolerance)      Note: This dictation was prepared with Dragon dictation along with smaller phrase technology. Any  transcriptional errors that result from this process are unintentional.

## 2014-04-15 NOTE — Assessment & Plan Note (Signed)
Weight is up concerned. She is very high risk for coronary artery disease diabetes mellitus hypertension. She was to change her diet we discussed 1800-calorie diet she was provided with handouts for this as well as for low-cholesterol eating. She will follow-up in 3 months time for her weight. She will have fasting labs done in 1 week

## 2014-04-15 NOTE — Patient Instructions (Addendum)
Return for fasting labs in  1 week F/U 3 months Calorie Counting for Weight Loss Calories are energy you get from the things you eat and drink. Your body uses this energy to keep you going throughout the day. The number of calories you eat affects your weight. When you eat more calories than your body needs, your body stores the extra calories as fat. When you eat fewer calories than your body needs, your body burns fat to get the energy it needs. Calorie counting means keeping track of how many calories you eat and drink each day. If you make sure to eat fewer calories than your body needs, you should lose weight. In order for calorie counting to work, you will need to eat the number of calories that are right for you in a day to lose a healthy amount of weight per week. A healthy amount of weight to lose per week is usually 1-2 lb (0.5-0.9 kg). A dietitian can determine how many calories you need in a day and give you suggestions on how to reach your calorie goal.  WHAT IS MY MY PLAN? My goal is to have __________ calories per day.  If I have this many calories per day, I should lose around __________ pounds per week. WHAT DO I NEED TO KNOW ABOUT CALORIE COUNTING? In order to meet your daily calorie goal, you will need to:  Find out how many calories are in each food you would like to eat. Try to do this before you eat.  Decide how much of the food you can eat.  Write down what you ate and how many calories it had. Doing this is called keeping a food log. WHERE DO I FIND CALORIE INFORMATION? The number of calories in a food can be found on a Nutrition Facts label. Note that all the information on a label is based on a specific serving of the food. If a food does not have a Nutrition Facts label, try to look up the calories online or ask your dietitian for help. HOW DO I DECIDE HOW MUCH TO EAT? To decide how much of the food you can eat, you will need to consider both the number of calories in  one serving and the size of one serving. This information can be found on the Nutrition Facts label. If a food does not have a Nutrition Facts label, look up the information online or ask your dietitian for help. Remember that calories are listed per serving. If you choose to have more than one serving of a food, you will have to multiply the calories per serving by the amount of servings you plan to eat. For example, the label on a package of bread might say that a serving size is 1 slice and that there are 90 calories in a serving. If you eat 1 slice, you will have eaten 90 calories. If you eat 2 slices, you will have eaten 180 calories. HOW DO I KEEP A FOOD LOG? After each meal, record the following information in your food log:  What you ate.  How much of it you ate.  How many calories it had.  Then, add up your calories. Keep your food log near you, such as in a small notebook in your pocket. Another option is to use a mobile app or website. Some programs will calculate calories for you and show you how many calories you have left each time you add an item to the log. WHAT  ARE SOME CALORIE COUNTING TIPS?  Use your calories on foods and drinks that will fill you up and not leave you hungry. Some examples of this include foods like nuts and nut butters, vegetables, lean proteins, and high-fiber foods (more than 5 g fiber per serving).  Eat nutritious foods and avoid empty calories. Empty calories are calories you get from foods or beverages that do not have many nutrients, such as candy and soda. It is better to have a nutritious high-calorie food (such as an avocado) than a food with few nutrients (such as a bag of chips).  Know how many calories are in the foods you eat most often. This way, you do not have to look up how many calories they have each time you eat them.  Look out for foods that may seem like low-calorie foods but are really high-calorie foods, such as baked goods, soda, and  fat-free candy.  Pay attention to calories in drinks. Drinks such as sodas, specialty coffee drinks, alcohol, and juices have a lot of calories yet do not fill you up. Choose low-calorie drinks like water and diet drinks.  Focus your calorie counting efforts on higher calorie items. Logging the calories in a garden salad that contains only vegetables is less important than calculating the calories in a milk shake.  Find a way of tracking calories that works for you. Get creative. Most people who are successful find ways to keep track of how much they eat in a day, even if they do not count every calorie. WHAT ARE SOME PORTION CONTROL TIPS?  Know how many calories are in a serving. This will help you know how many servings of a certain food you can have.  Use a measuring cup to measure serving sizes. This is helpful when you start out. With time, you will be able to estimate serving sizes for some foods.  Take some time to put servings of different foods on your favorite plates, bowls, and cups so you know what a serving looks like.  Try not to eat straight from a bag or box. Doing this can lead to overeating. Put the amount you would like to eat in a cup or on a plate to make sure you are eating the right portion.  Use smaller plates, glasses, and bowls to prevent overeating. This is a quick and easy way to practice portion control. If your plate is smaller, less food can fit on it.  Try not to multitask while eating, such as watching TV or using your computer. If it is time to eat, sit down at a table and enjoy your food. Doing this will help you to start recognizing when you are full. It will also make you more aware of what and how much you are eating. HOW CAN I CALORIE COUNT WHEN EATING OUT?  Ask for smaller portion sizes or child-sized portions.  Consider sharing an entree and sides instead of getting your own entree.  If you get your own entree, eat only half. Ask for a box at the  beginning of your meal and put the rest of your entree in it so you are not tempted to eat it.  Look for the calories on the menu. If calories are listed, choose the lower calorie options.  Choose dishes that include vegetables, fruits, whole grains, low-fat dairy products, and lean protein. Focusing on smart food choices from each of the 5 food groups can help you stay on track at restaurants.  Choose items that are boiled, broiled, grilled, or steamed.  Choose water, milk, unsweetened iced tea, or other drinks without added sugars. If you want an alcoholic beverage, choose a lower calorie option. For example, a regular margarita can have up to 700 calories and a glass of wine has around 150.  Stay away from items that are buttered, battered, fried, or served with cream sauce. Items labeled "crispy" are usually fried, unless stated otherwise.  Ask for dressings, sauces, and syrups on the side. These are usually very high in calories, so do not eat much of them.  Watch out for salads. Many people think salads are a healthy option, but this is often not the case. Many salads come with bacon, fried chicken, lots of cheese, fried chips, and dressing. All of these items have a lot of calories. If you want a salad, choose a garden salad and ask for grilled meats or steak. Ask for the dressing on the side, or ask for olive oil and vinegar or lemon to use as dressing.  Estimate how many servings of a food you are given. For example, a serving of cooked rice is  cup or about the size of half a tennis ball or one cupcake wrapper. Knowing serving sizes will help you be aware of how much food you are eating at restaurants. The list below tells you how big or small some common portion sizes are based on everyday objects.  1 oz--4 stacked dice.  3 oz--1 deck of cards.  1 tsp--1 dice.  1 Tbsp-- a Ping-Pong ball.  2 Tbsp--1 Ping-Pong ball.   cup--1 tennis ball or 1 cupcake wrapper.  1 cup--1  baseball. Document Released: 02/04/2005 Document Revised: 06/21/2013 Document Reviewed: 12/10/2012 [facility] Patient Information 2015 [place], [place]. This information is not intended to replace advice given to you by your health care provider. Make sure you discuss any questions you have with your health care provider.

## 2014-04-15 NOTE — Assessment & Plan Note (Signed)
Per above, discussed diet, labs in 1 week

## 2014-04-22 ENCOUNTER — Other Ambulatory Visit: Payer: BC Managed Care – PPO

## 2014-04-22 DIAGNOSIS — E781 Pure hyperglyceridemia: Secondary | ICD-10-CM

## 2014-04-22 DIAGNOSIS — R7302 Impaired glucose tolerance (oral): Secondary | ICD-10-CM

## 2014-04-22 LAB — CBC WITH DIFFERENTIAL/PLATELET
Basophils Absolute: 0 10*3/uL (ref 0.0–0.1)
Basophils Relative: 0 % (ref 0–1)
EOS ABS: 0.2 10*3/uL (ref 0.0–0.7)
Eosinophils Relative: 3 % (ref 0–5)
HCT: 44 % (ref 36.0–46.0)
Hemoglobin: 14.9 g/dL (ref 12.0–15.0)
Lymphocytes Relative: 30 % (ref 12–46)
Lymphs Abs: 2.3 10*3/uL (ref 0.7–4.0)
MCH: 30.5 pg (ref 26.0–34.0)
MCHC: 33.9 g/dL (ref 30.0–36.0)
MCV: 90 fL (ref 78.0–100.0)
MPV: 11.4 fL (ref 8.6–12.4)
Monocytes Absolute: 0.4 10*3/uL (ref 0.1–1.0)
Monocytes Relative: 5 % (ref 3–12)
NEUTROS ABS: 4.7 10*3/uL (ref 1.7–7.7)
Neutrophils Relative %: 62 % (ref 43–77)
Platelets: 316 10*3/uL (ref 150–400)
RBC: 4.89 MIL/uL (ref 3.87–5.11)
RDW: 14.2 % (ref 11.5–15.5)
WBC: 7.6 10*3/uL (ref 4.0–10.5)

## 2014-04-22 LAB — COMPLETE METABOLIC PANEL WITH GFR
ALBUMIN: 3.7 g/dL (ref 3.5–5.2)
ALK PHOS: 73 U/L (ref 39–117)
ALT: 19 U/L (ref 0–35)
AST: 19 U/L (ref 0–37)
BUN: 10 mg/dL (ref 6–23)
CO2: 23 meq/L (ref 19–32)
Calcium: 9 mg/dL (ref 8.4–10.5)
Chloride: 107 mEq/L (ref 96–112)
Creat: 0.97 mg/dL (ref 0.50–1.10)
GFR, Est African American: 82 mL/min
GFR, Est Non African American: 71 mL/min
GLUCOSE: 123 mg/dL — AB (ref 70–99)
POTASSIUM: 4.7 meq/L (ref 3.5–5.3)
SODIUM: 140 meq/L (ref 135–145)
TOTAL PROTEIN: 6.3 g/dL (ref 6.0–8.3)
Total Bilirubin: 0.4 mg/dL (ref 0.2–1.2)

## 2014-04-22 LAB — LIPID PANEL
CHOL/HDL RATIO: 4.6 ratio
Cholesterol: 179 mg/dL (ref 0–200)
HDL: 39 mg/dL — AB (ref >=46)
LDL CALC: 104 mg/dL — AB (ref 0–99)
Triglycerides: 181 mg/dL — ABNORMAL HIGH (ref <150)
VLDL: 36 mg/dL (ref 0–40)

## 2014-04-23 LAB — HEMOGLOBIN A1C
Hgb A1c MFr Bld: 5.9 % — ABNORMAL HIGH (ref <5.7)
Mean Plasma Glucose: 123 mg/dL — ABNORMAL HIGH (ref <117)

## 2014-04-25 ENCOUNTER — Encounter: Payer: Self-pay | Admitting: *Deleted

## 2014-04-28 ENCOUNTER — Telehealth: Payer: Self-pay | Admitting: Family Medicine

## 2014-04-28 NOTE — Telephone Encounter (Signed)
Patient returned call and made aware.

## 2014-04-28 NOTE — Telephone Encounter (Signed)
Patient calling about lab results  [phone]

## 2014-07-15 ENCOUNTER — Encounter: Payer: Self-pay | Admitting: Family Medicine

## 2014-07-15 ENCOUNTER — Ambulatory Visit (INDEPENDENT_AMBULATORY_CARE_PROVIDER_SITE_OTHER): Payer: BC Managed Care – PPO | Admitting: Family Medicine

## 2014-07-15 VITALS — BP 126/74 | HR 78 | Temp 98.5°F | Resp 16 | Ht 65.0 in | Wt 252.0 lb

## 2014-07-15 DIAGNOSIS — E669 Obesity, unspecified: Secondary | ICD-10-CM | POA: Diagnosis not present

## 2014-07-15 DIAGNOSIS — R7302 Impaired glucose tolerance (oral): Secondary | ICD-10-CM

## 2014-07-15 NOTE — Progress Notes (Signed)
Patient ID: Alexandra StainsCandace L Christensen, female   DOB: Apr 13, 1970, 44 y.o.   MRN: [id number]   Subjective:    Patient ID: Alexandra Christensen, female    DOB: Apr 13, 1970, 44 y.o.   MRN: [id number]  Patient presents for 3 month F/U  Pt here to f/u weight. She is down 10 lbs since our last visit 3 months, she has hypertriglyceridemia and glucose intolerance. No concerns today, feeling well. She has cut out soda, trying to cut out fried foods, still snacks a lot, will have peantuts, crackers, peanut butter, pimento cheese between meals. She is walking 3 days a weeks. [place] [employer] She and husband are trying to loose weight together She is taking DIpalex- Standard Process supplement about 60% of the time    Review Of Systems:  GEN- denies fatigue, fever, weight loss,weakness, recent illness HEENT- denies eye drainage, change in vision, nasal discharge, CVS- denies chest pain, palpitations RESP- denies SOB, cough, wheeze ABD- denies N/V, change in stools, abd pain GU- denies dysuria, hematuria, dribbling, incontinence MSK- denies joint pain, muscle aches, injury Neuro- denies headache, dizziness, syncope, seizure activity       Objective:    BP 126/74 mmHg  Pulse 78  Temp(Src) 98.5 F (36.9 C) (Oral)  Resp 16  Ht 5\' 5"  (1.651 m)  Wt 252 lb (114.306 kg)  BMI 41.93 kg/m2  LMP 07/03/2014 (Approximate) GEN- NAD, alert and oriented x3        Assessment & Plan:      Problem List Items Addressed This Visit    Obesity - Primary   Glucose intolerance (impaired glucose tolerance)    Continue to work on diet and weight loss Will recheck labs in 3 months Cholesterol had improved at last check Discussed low calorie diet- approx 1600 calories, Myfitness PAL APP downloaded during visit Increase exercise to 5 days a week Goal 15lbs off by next visit         Note: This dictation was prepared with Dragon dictation along with smaller phrase technology. Any transcriptional errors that result  from this process are unintentional.

## 2014-07-15 NOTE — Assessment & Plan Note (Addendum)
Discussed weight loss supplements as a last resort I also reviewed the supplement, okay for her to take

## 2014-07-15 NOTE — Patient Instructions (Signed)
Get work up to 5 days  MyfitnessPAL --  Track your calories  Cut out bread 9 unless whole wheat or multivimtain , Ritz crackers,  Eat apples/celery with peanut or almond butter Try Cashews and Almons or healthy nut mix- no salt added  F/U 3 months

## 2014-07-15 NOTE — Assessment & Plan Note (Signed)
Continue to work on diet and weight loss Will recheck labs in 3 months Cholesterol had improved at last check Discussed low calorie diet- approx 1600 calories, Myfitness PAL APP downloaded during visit Increase exercise to 5 days a week Goal 15lbs off by next visit

## 2014-08-30 ENCOUNTER — Telehealth: Payer: Self-pay | Admitting: Family Medicine

## 2014-08-30 NOTE — Telephone Encounter (Signed)
Left knee bothering her for several weeks. Has been treating with ibuprofen and ice.  Not getting better.  Made appt for Thursday

## 2014-08-31 ENCOUNTER — Telehealth: Payer: Self-pay | Admitting: Family Medicine

## 2014-08-31 ENCOUNTER — Other Ambulatory Visit: Payer: Self-pay | Admitting: Family Medicine

## 2014-08-31 MED ORDER — IBUPROFEN 800 MG PO TABS: 800.0000 mg | ORAL_TABLET | Freq: Three times a day (TID) | ORAL | Status: DC | PRN

## 2014-08-31 NOTE — Telephone Encounter (Signed)
Medication called/sent to requested pharmacy  

## 2014-08-31 NOTE — Telephone Encounter (Signed)
Refill changed to pt pharmacy of choice

## 2014-08-31 NOTE — Telephone Encounter (Signed)
[facility] [address] [place]  Patient calling for refill on her 800mg  ibuprofin  She is changing to pharmacy above  [phone]

## 2014-09-01 ENCOUNTER — Ambulatory Visit (INDEPENDENT_AMBULATORY_CARE_PROVIDER_SITE_OTHER): Payer: BC Managed Care – PPO | Admitting: Physician Assistant

## 2014-09-01 ENCOUNTER — Encounter: Payer: Self-pay | Admitting: Physician Assistant

## 2014-09-01 VITALS — BP 132/78 | HR 78 | Temp 97.9°F | Resp 14 | Ht 65.0 in | Wt 257.0 lb

## 2014-09-01 DIAGNOSIS — M25562 Pain in left knee: Secondary | ICD-10-CM

## 2014-09-01 MED ORDER — TRAMADOL HCL 50 MG PO TABS: ORAL_TABLET | ORAL | Status: DC

## 2014-09-01 NOTE — Progress Notes (Signed)
Patient ID: Alexandra Christensen MRN: [id number], DOB: 24-Jan-1971, 44 y.o. Date of Encounter: 09/01/2014, 10:02 AM    Chief Complaint:  Chief Complaint  Patient presents with  . L Knee Pain    x3 weeks- states that she has increased pain that shoots down leg and into shin     HPI: 44 y.o. year old white female presents with above complaint.  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 Says that she is able to work from home. Works at desk all day doing computer work. However was going to the gym and walking on treadmill Monday through Thursday. However has not been able to do this for the past 3 weeks because of this knee pain. Says it was on a weekend that she developed pain in her left knee--- Says it was not while she was exercising or doing any specific activity. Says that she has had no trauma or injury to the knee. Has done no unusual activity--- sits at a desk, walks on treadmill, says that she does mowe her yard with a push mower but no other type of unusual activity.  Says that her left knee has started feeling like it is about to give way so she has been scared to walk on the treadmill anymore. Feels like it could give way any time.  Also she has a throbbing achy pain in the knee at all times for the past 3 weeks--says the only time she does not feel the throbbing pain is when she has had ice on it and it is numb. Says that since July 2 it has been affecting her sleep and she has not been getting good sleep because of this achy discomfort in her knee. She has been taking 2 or 3 ibuprofen per day. Has tried applying heat has tried ice and also elevation.  Has no prior history of problems with this knee or surgery to this knee. Has seen no particular orthopedic.     Home Meds:   Outpatient Prescriptions Prior to Visit  Medication Sig Dispense Refill  . ALPRAZolam (XANAX) 1 MG tablet Take 1 tablet (1 mg total) by mouth at bedtime as needed for anxiety.  30 tablet 3  . ibuprofen (ADVIL,MOTRIN) 800 MG tablet Take 1 tablet (800 mg total) by mouth every 8 (eight) hours as needed. 30 tablet 2  . nystatin cream (MYCOSTATIN) Apply topically 2 (two) times daily. Apply to effected areas twice daily. 30 g 3   No facility-administered medications prior to visit.    Allergies: No Known Allergies    Review of Systems: See HPI for pertinent ROS. All other ROS negative.    Physical Exam: Blood pressure 132/78, pulse 78, temperature 97.9 F (36.6 C), temperature source Oral, resp. rate 14, height  (1.651 m), weight 257 lb (116.574 kg)., Body mass index is 42.77 kg/(m^2). General:  Obese WF. Appears in no acute distress. Neck: Supple. No thyromegaly. No lymphadenopathy. Lungs: Clear bilaterally to auscultation without wheezes, rales, or rhonchi. Breathing is unlabored. Heart: Regular rhythm. No murmurs, rubs, or gallops. Msk:  Strength and tone normal for 44. Left Knee: Very minimal swelling medial aspect of knee. Remainder of inspection is normal. No ecchymosis or erythema warmth etc. There is pain with palpation of the anterior medial aspect of the knee along the knee joint line. This is the area of the most significant pain with palpation. Positive Grind Test. No laxity with valgus or  varus maneuvers. Negative anterior drawer. Extremities/Skin: Warm and dry.  Neuro: Alert and oriented X 3. Moves all extremities spontaneously. Gait is normal. CNII-XII grossly in tact. Psych:  Responds to questions appropriately with a normal affect.     ASSESSMENT AND PLAN:  44 y.o. year old female with  1. Left knee pain Given her history suspect meniscal tear. Will refer to orthopedics for further evaluation. She is agreeable with this approach. In the interim will try tramadol for pain relief. She is to continue to stay off of the leg as much as possible and apply ice. - Ambulatory referral to Orthopedic Surgery - traMADol (ULTRAM) 50 MG tablet; Take 1  - 2 every 8 hours as needed for pain  Dispense: 60 tablet; Refill: 0   Signed, [address] Beth [place], [place], [facility] 09/01/2014 10:02 AM

## 2014-10-17 ENCOUNTER — Ambulatory Visit: Payer: BC Managed Care – PPO | Admitting: Family Medicine

## 2014-11-01 ENCOUNTER — Encounter: Payer: Self-pay | Admitting: Family Medicine

## 2014-11-01 ENCOUNTER — Ambulatory Visit (INDEPENDENT_AMBULATORY_CARE_PROVIDER_SITE_OTHER): Payer: BC Managed Care – PPO | Admitting: Family Medicine

## 2014-11-01 VITALS — BP 134/70 | HR 68 | Temp 98.6°F | Resp 16 | Ht 65.0 in | Wt 253.0 lb

## 2014-11-01 DIAGNOSIS — Z23 Encounter for immunization: Secondary | ICD-10-CM

## 2014-11-01 DIAGNOSIS — F17200 Nicotine dependence, unspecified, uncomplicated: Secondary | ICD-10-CM

## 2014-11-01 DIAGNOSIS — F5102 Adjustment insomnia: Secondary | ICD-10-CM

## 2014-11-01 DIAGNOSIS — Z720 Tobacco use: Secondary | ICD-10-CM | POA: Diagnosis not present

## 2014-11-01 DIAGNOSIS — E669 Obesity, unspecified: Secondary | ICD-10-CM | POA: Diagnosis not present

## 2014-11-01 DIAGNOSIS — E781 Pure hyperglyceridemia: Secondary | ICD-10-CM | POA: Diagnosis not present

## 2014-11-01 DIAGNOSIS — R7302 Impaired glucose tolerance (oral): Secondary | ICD-10-CM

## 2014-11-01 LAB — LIPID PANEL
Cholesterol: 193 mg/dL (ref 125–200)
HDL: 41 mg/dL — ABNORMAL LOW (ref >=46)
LDL Cholesterol: 110 mg/dL (ref <130)
TRIGLYCERIDES: 210 mg/dL — AB (ref <150)
Total CHOL/HDL Ratio: 4.7 Ratio (ref <=5.0)
VLDL: 42 mg/dL — ABNORMAL HIGH (ref <30)

## 2014-11-01 LAB — CBC WITH DIFFERENTIAL/PLATELET
BASOS ABS: 0 10*3/uL (ref 0.0–0.1)
Basophils Relative: 0 % (ref 0–1)
Eosinophils Absolute: 0.3 10*3/uL (ref 0.0–0.7)
Eosinophils Relative: 3 % (ref 0–5)
HEMATOCRIT: 43.7 % (ref 36.0–46.0)
HEMOGLOBIN: 14.9 g/dL (ref 12.0–15.0)
LYMPHS PCT: 26 % (ref 12–46)
Lymphs Abs: 2.7 10*3/uL (ref 0.7–4.0)
MCH: 31 pg (ref 26.0–34.0)
MCHC: 34.1 g/dL (ref 30.0–36.0)
MCV: 90.9 fL (ref 78.0–100.0)
MPV: 11.4 fL (ref 8.6–12.4)
Monocytes Absolute: 0.4 10*3/uL (ref 0.1–1.0)
Monocytes Relative: 4 % (ref 3–12)
NEUTROS ABS: 7 10*3/uL (ref 1.7–7.7)
Neutrophils Relative %: 67 % (ref 43–77)
Platelets: 335 10*3/uL (ref 150–400)
RBC: 4.81 MIL/uL (ref 3.87–5.11)
RDW: 14.1 % (ref 11.5–15.5)
WBC: 10.4 10*3/uL (ref 4.0–10.5)

## 2014-11-01 LAB — COMPREHENSIVE METABOLIC PANEL
ALBUMIN: 3.9 g/dL (ref 3.6–5.1)
ALK PHOS: 84 U/L (ref 33–115)
ALT: 18 U/L (ref 6–29)
AST: 14 U/L (ref 10–35)
BILIRUBIN TOTAL: 0.5 mg/dL (ref 0.2–1.2)
BUN: 12 mg/dL (ref 7–25)
CALCIUM: 9 mg/dL (ref 8.6–10.2)
CO2: 22 mmol/L (ref 20–31)
Chloride: 103 mmol/L (ref 98–110)
Creat: 0.81 mg/dL (ref 0.50–1.10)
GLUCOSE: 105 mg/dL — AB (ref 70–99)
Potassium: 4.4 mmol/L (ref 3.5–5.3)
Sodium: 139 mmol/L (ref 135–146)
Total Protein: 6.5 g/dL (ref 6.1–8.1)

## 2014-11-01 LAB — HEMOGLOBIN A1C
HEMOGLOBIN A1C: 6 % — AB (ref <5.7)
Mean Plasma Glucose: 126 mg/dL — ABNORMAL HIGH (ref <117)

## 2014-11-01 MED ORDER — ALPRAZOLAM 1 MG PO TABS: 1.0000 mg | ORAL_TABLET | Freq: Every evening | ORAL | Status: DC | PRN

## 2014-11-01 MED ORDER — BUPROPION HCL ER (SMOKING DET) 150 MG PO TB12: 150.0000 mg | ORAL_TABLET | Freq: Two times a day (BID) | ORAL | Status: DC

## 2014-11-01 MED ORDER — IBUPROFEN 800 MG PO TABS: 800.0000 mg | ORAL_TABLET | Freq: Three times a day (TID) | ORAL | Status: DC | PRN

## 2014-11-01 NOTE — Assessment & Plan Note (Signed)
Continue xanax 

## 2014-11-01 NOTE — Assessment & Plan Note (Signed)
Her risk factors for heart disease and stroke are her obesity and her smoking. Continue to encourage low-carb diet healthcare eating. He is also getting back to exercising.

## 2014-11-01 NOTE — Assessment & Plan Note (Signed)
Recheck A1c along with fasting labs.

## 2014-11-01 NOTE — Patient Instructions (Signed)
Flu shot given Try the wellbutrin for smoking- start with 1 in the morning 3 days then twice a day We will call with lab results Pneumonia vaccine given F/U 4 months

## 2014-11-01 NOTE — Assessment & Plan Note (Signed)
Trial of wellbutrin, could not afford Chantix

## 2014-11-01 NOTE — Progress Notes (Signed)
Patient ID: Alexandra Christensen, female   DOB: [birth year], 44 y.o.   MRN: [id number]   Subjective:    Patient ID: Alexandra Christensen, female    DOB: [birth year], 44 y.o.   MRN: [id number]  Patient presents for 3 month F/U  here to follow-up chronic medical problems. She has no particular concerns today. She actually gained about 5 pounds after her last visit and is down drop down for of those pounds. She states that she had some stressors recently where she was overeating. Her sister who is 88 and very healthy had a recent stroke and they cannot find a cause of this. She is now looking at her own health it is serious about getting things on track including her smoking in her weight. Medications were reviewed.  she will like to try something to help her quit smoking.   Review Of Systems:  GEN- denies fatigue, fever, weight loss,weakness, recent illness HEENT- denies eye drainage, change in vision, nasal discharge, CVS- denies chest pain, palpitations RESP- denies SOB, cough, wheeze ABD- denies N/V, change in stools, abd pain GU- denies dysuria, hematuria, dribbling, incontinence MSK- denies joint pain, muscle aches, injury Neuro- denies headache, dizziness, syncope, seizure activity       Objective:    BP 134/70 mmHg  Pulse 68  Temp(Src) 98.6 F (37 C) (Oral)  Resp 16  Ht  (1.651 m)  Wt 253 lb (114.76 kg)  BMI 42.10 kg/m2  LMP 10/16/2014 (Approximate) GEN- NAD, alert and oriented x3, obese  HEENT- PERRL, EOMI, non injected sclera, pink conjunctiva, MMM, oropharynx clear Neck- Supple, no thyromegaly CVS- RRR, no murmur RESP-CTAB Psych- normal affect and mood EXT- No edema Pulses- Radial, DP- 2+        Assessment & Plan:      Problem List Items Addressed This Visit    Obesity   Hypertriglyceridemia   Relevant Orders   Lipid panel   Glucose intolerance (impaired glucose tolerance) - Primary   Relevant Orders   CBC with Differential/Platelet   Comprehensive  metabolic panel   Hemoglobin A1c    Other Visit Diagnoses    Need for prophylactic vaccination and inoculation against influenza        Relevant Orders    Flu Vaccine QUAD 36+ mos PF IM (Fluarix & Fluzone Quad PF) (Completed)    Need for prophylactic vaccination against Streptococcus pneumoniae (pneumococcus)        Relevant Orders    Pneumococcal polysaccharide vaccine 23-valent greater than or equal to 2yo subcutaneous/IM (Completed)       Note: This dictation was prepared with Dragon dictation along with smaller phrase technology. Any transcriptional errors that result from this process are unintentional.

## 2014-11-03 ENCOUNTER — Telehealth: Payer: Self-pay | Admitting: *Deleted

## 2014-11-03 ENCOUNTER — Other Ambulatory Visit: Payer: Self-pay | Admitting: *Deleted

## 2014-11-03 MED ORDER — SIMVASTATIN 20 MG PO TABS: 20.0000 mg | ORAL_TABLET | Freq: Every day | ORAL | Status: DC

## 2014-11-03 NOTE — Telephone Encounter (Signed)
Received call from patient.   States that she had discussed weight loss medication with MD during visit.   States that she would like to try medication to assist with diet.   MD please advise.

## 2014-11-04 NOTE — Telephone Encounter (Signed)
She can try phentermine but she cant start at the same time as the wellbutrin for her smoking. If she started wellbutrin she needs to wait at least 1 month due to side effects.

## 2014-11-04 NOTE — Telephone Encounter (Signed)
Call placed to patient and patient made aware.   Reminder set to call in Phentermine in (1) month.

## 2015-03-06 ENCOUNTER — Ambulatory Visit: Payer: Self-pay | Admitting: Family Medicine

## 2015-03-15 ENCOUNTER — Ambulatory Visit (INDEPENDENT_AMBULATORY_CARE_PROVIDER_SITE_OTHER): Payer: BC Managed Care – PPO | Admitting: Family Medicine

## 2015-03-15 ENCOUNTER — Encounter: Payer: Self-pay | Admitting: Family Medicine

## 2015-03-15 VITALS — BP 124/78 | HR 82 | Temp 98.0°F | Resp 14 | Ht 63.0 in | Wt 260.0 lb

## 2015-03-15 DIAGNOSIS — F17200 Nicotine dependence, unspecified, uncomplicated: Secondary | ICD-10-CM

## 2015-03-15 DIAGNOSIS — M25562 Pain in left knee: Secondary | ICD-10-CM | POA: Diagnosis not present

## 2015-03-15 DIAGNOSIS — R7302 Impaired glucose tolerance (oral): Secondary | ICD-10-CM | POA: Diagnosis not present

## 2015-03-15 DIAGNOSIS — E669 Obesity, unspecified: Secondary | ICD-10-CM | POA: Diagnosis not present

## 2015-03-15 DIAGNOSIS — G629 Polyneuropathy, unspecified: Secondary | ICD-10-CM | POA: Diagnosis not present

## 2015-03-15 DIAGNOSIS — E781 Pure hyperglyceridemia: Secondary | ICD-10-CM

## 2015-03-15 DIAGNOSIS — Z720 Tobacco use: Secondary | ICD-10-CM

## 2015-03-15 LAB — COMPREHENSIVE METABOLIC PANEL
ALT: 19 U/L (ref 6–29)
AST: 16 U/L (ref 10–35)
Albumin: 3.7 g/dL (ref 3.6–5.1)
Alkaline Phosphatase: 79 U/L (ref 33–115)
BILIRUBIN TOTAL: 0.4 mg/dL (ref 0.2–1.2)
BUN: 12 mg/dL (ref 7–25)
CALCIUM: 8.9 mg/dL (ref 8.6–10.2)
CO2: 24 mmol/L (ref 20–31)
CREATININE: 0.74 mg/dL (ref 0.50–1.10)
Chloride: 104 mmol/L (ref 98–110)
GLUCOSE: 118 mg/dL — AB (ref 70–99)
Potassium: 4.2 mmol/L (ref 3.5–5.3)
SODIUM: 136 mmol/L (ref 135–146)
Total Protein: 6.6 g/dL (ref 6.1–8.1)

## 2015-03-15 LAB — CBC WITH DIFFERENTIAL/PLATELET
BASOS PCT: 1 % (ref 0–1)
Basophils Absolute: 0.1 10*3/uL (ref 0.0–0.1)
EOS ABS: 0.4 10*3/uL (ref 0.0–0.7)
EOS PCT: 4 % (ref 0–5)
HCT: 43.3 % (ref 36.0–46.0)
Hemoglobin: 14.6 g/dL (ref 12.0–15.0)
Lymphocytes Relative: 28 % (ref 12–46)
Lymphs Abs: 2.8 10*3/uL (ref 0.7–4.0)
MCH: 30.7 pg (ref 26.0–34.0)
MCHC: 33.7 g/dL (ref 30.0–36.0)
MCV: 91 fL (ref 78.0–100.0)
MPV: 11.1 fL (ref 8.6–12.4)
Monocytes Absolute: 0.5 10*3/uL (ref 0.1–1.0)
Monocytes Relative: 5 % (ref 3–12)
Neutro Abs: 6.3 10*3/uL (ref 1.7–7.7)
Neutrophils Relative %: 62 % (ref 43–77)
Platelets: 336 10*3/uL (ref 150–400)
RBC: 4.76 MIL/uL (ref 3.87–5.11)
RDW: 14.4 % (ref 11.5–15.5)
WBC: 10.1 10*3/uL (ref 4.0–10.5)

## 2015-03-15 LAB — LIPID PANEL
Cholesterol: 198 mg/dL (ref 125–200)
HDL: 36 mg/dL — AB (ref >=46)
LDL CALC: 90 mg/dL (ref <130)
Total CHOL/HDL Ratio: 5.5 Ratio — ABNORMAL HIGH (ref <=5.0)
Triglycerides: 358 mg/dL — ABNORMAL HIGH (ref <150)
VLDL: 72 mg/dL — ABNORMAL HIGH (ref <30)

## 2015-03-15 LAB — HEMOGLOBIN A1C
HEMOGLOBIN A1C: 6.4 % — AB (ref <5.7)
Mean Plasma Glucose: 137 mg/dL — ABNORMAL HIGH (ref <117)

## 2015-03-15 MED ORDER — BUPROPION HCL ER (SMOKING DET) 150 MG PO TB12: 150.0000 mg | ORAL_TABLET | Freq: Two times a day (BID) | ORAL | Status: DC

## 2015-03-15 MED ORDER — TRAMADOL HCL 50 MG PO TABS: ORAL_TABLET | ORAL | Status: DC

## 2015-03-15 MED ORDER — PHENTERMINE HCL 37.5 MG PO TABS: 37.5000 mg | ORAL_TABLET | Freq: Every day | ORAL | Status: DC

## 2015-03-15 MED ORDER — IBUPROFEN 800 MG PO TABS: 800.0000 mg | ORAL_TABLET | Freq: Three times a day (TID) | ORAL | Status: DC | PRN

## 2015-03-15 MED ORDER — ALPRAZOLAM 1 MG PO TABS: 1.0000 mg | ORAL_TABLET | Freq: Every evening | ORAL | Status: DC | PRN

## 2015-03-15 NOTE — Patient Instructions (Signed)
Restart the wellbutrin Work on diet and exercise We will call with lab results F/U 3 Months

## 2015-03-15 NOTE — Assessment & Plan Note (Signed)
Is been on simvastatin for the past 2 months. Recheck labs

## 2015-03-15 NOTE — Assessment & Plan Note (Signed)
Restart Wellbutrin advise her to take for the full 12 weeks to keep her tobacco free and to help with the mood changes during this period of coming off of the tobacco.

## 2015-03-15 NOTE — Progress Notes (Signed)
Patient ID: Alexandra Christensen, female   DOB: Jun 03, 1970, 45 y.o.   MRN: [id number]   Subjective:    Patient ID: Alexandra Christensen, female    DOB: [birth year], 45 y.o.   MRN: [id number]  Patient presents for 6 month F/U  patient here for follow-up she's history of glucose intolerance morbid obesity hypertriglyceridemia. Her weight is up from her last visit now at 260 pounds. Last set of labs showed an A1c is 6% and her triglycerides were elevated. She is currently on simvastatin which we started approximate 4 months ago. Also states that she gets numbness and tingling in her feet and that her legs ache. She's had vascular studies done in the past which were normal. She is more concerned that she has developed diabetes. She's taking vitamin B 12 and also uses tramadol because of knee pain which also helps her legs and feet.  He was given Wellbutrin to help with tobacco cessation she took it for about 4 weeks states that she quit smoking but felt funny on the medication some day so she stopped and then became more moody and started smoking and eating more. She would like to try the medication again. She will also like something to help with her weight loss.    Review Of Systems:  GEN- denies fatigue, fever, weight loss,weakness, recent illness HEENT- denies eye drainage, change in vision, nasal discharge, CVS- denies chest pain, palpitations RESP- denies SOB, cough, wheeze ABD- denies N/V, change in stools, abd pain GU- denies dysuria, hematuria, dribbling, incontinence MSK- + joint pain, muscle aches, injury Neuro- denies headache, dizziness, syncope, seizure activity       Objective:    BP 124/78 mmHg  Pulse 82  Temp(Src) 98 F (36.7 C) (Oral)  Resp 14  Ht  (1.6 m)  Wt 260 lb (117.935 kg)  BMI 46.07 kg/m2  LMP 03/12/2015 (Approximate) GEN- NAD, alert and oriented x3 HEENT- PERRL, EOMI, non injected sclera, pink conjunctiva, MMM, oropharynx clear Neck- Supple, no  thyromegaly CVS- RRR, no murmur RESP-CTAB ABD-NABS,soft,NT,ND EXT- No edema Pulses- Radial, DP- 2+ Neuro- decresaed monofilament, callus bilat        Assessment & Plan:      Problem List Items Addressed This Visit    Smoker    Restart Wellbutrin advise her to take for the full 12 weeks to keep her tobacco free and to help with the mood changes during this period of coming off of the tobacco.      Obesity    And to try phentermine. We will start with a half a tablet for week and then increase to one full tablet. She will not start this until I see the results of her blood work.      Relevant Medications   phentermine (ADIPEX-P) 37.5 MG tablet   Neuropathy (HCC)   Hypertriglyceridemia - Primary    Is been on simvastatin for the past 2 months. Recheck labs      Relevant Orders   Lipid panel   Glucose intolerance (impaired glucose tolerance)    As her increasing weight I am concerned that she has developed diabetes. She also has some neuropathic signs. Recheck labs today. Continue with the B complex      Relevant Orders   CBC with Differential/Platelet   Comprehensive metabolic panel   Hemoglobin A1c    Other Visit Diagnoses    Left knee pain        Relevant Medications    traMADol (ULTRAM) 50  MG tablet       Note: This dictation was prepared with Dragon dictation along with smaller phrase technology. Any transcriptional errors that result from this process are unintentional.

## 2015-03-15 NOTE — Assessment & Plan Note (Signed)
As her increasing weight I am concerned that she has developed diabetes. She also has some neuropathic signs. Recheck labs today. Continue with the B complex

## 2015-03-15 NOTE — Assessment & Plan Note (Signed)
And to try phentermine. We will start with a half a tablet for week and then increase to one full tablet. She will not start this until I see the results of her blood work.

## 2015-03-20 ENCOUNTER — Other Ambulatory Visit: Payer: Self-pay | Admitting: *Deleted

## 2015-03-20 MED ORDER — SIMVASTATIN 40 MG PO TABS: 40.0000 mg | ORAL_TABLET | Freq: Every day | ORAL | Status: DC

## 2015-03-24 ENCOUNTER — Other Ambulatory Visit: Payer: Self-pay | Admitting: *Deleted

## 2015-03-24 MED ORDER — BUTENAFINE HCL 1 % EX CREA: 1.0000 "application " | TOPICAL_CREAM | Freq: Two times a day (BID) | CUTANEOUS | Status: DC

## 2015-03-24 NOTE — Telephone Encounter (Signed)
Received fax requesting refill on Mentax.   Refill appropriate and filled per protocol.

## 2015-05-29 ENCOUNTER — Telehealth: Payer: Self-pay | Admitting: *Deleted

## 2015-05-29 NOTE — Telephone Encounter (Signed)
Received request from pharmacy for PA on Phentermine.   PA submitted.   Dx: E66.9- Obesity.   BMI: 46.2.

## 2015-05-30 ENCOUNTER — Encounter: Payer: Self-pay | Admitting: *Deleted

## 2015-05-30 NOTE — Telephone Encounter (Signed)
This encounter was created in error - please disregard.

## 2015-05-30 NOTE — Telephone Encounter (Signed)
Received PA determination.  ° °PA approved.  ° °Pharmacy made aware.  °

## 2015-06-21 ENCOUNTER — Ambulatory Visit: Payer: BC Managed Care – PPO | Admitting: Family Medicine

## 2015-07-28 LAB — HM PAP SMEAR

## 2015-07-28 LAB — HM MAMMOGRAPHY

## 2015-08-14 ENCOUNTER — Encounter: Payer: Self-pay | Admitting: Family Medicine

## 2015-08-14 ENCOUNTER — Ambulatory Visit (INDEPENDENT_AMBULATORY_CARE_PROVIDER_SITE_OTHER): Payer: BC Managed Care – PPO | Admitting: Family Medicine

## 2015-08-14 VITALS — BP 132/74 | HR 78 | Temp 98.7°F | Resp 14 | Ht 63.0 in | Wt 258.0 lb

## 2015-08-14 DIAGNOSIS — G629 Polyneuropathy, unspecified: Secondary | ICD-10-CM | POA: Diagnosis not present

## 2015-08-14 DIAGNOSIS — E669 Obesity, unspecified: Secondary | ICD-10-CM

## 2015-08-14 DIAGNOSIS — R0789 Other chest pain: Secondary | ICD-10-CM | POA: Diagnosis not present

## 2015-08-14 DIAGNOSIS — R7302 Impaired glucose tolerance (oral): Secondary | ICD-10-CM | POA: Diagnosis not present

## 2015-08-14 DIAGNOSIS — E781 Pure hyperglyceridemia: Secondary | ICD-10-CM

## 2015-08-14 LAB — CBC WITH DIFFERENTIAL/PLATELET
Basophils Absolute: 0 cells/uL (ref 0–200)
Basophils Relative: 0 %
EOS PCT: 3 %
Eosinophils Absolute: 300 cells/uL (ref 15–500)
HCT: 44.7 % (ref 35.0–45.0)
HEMOGLOBIN: 15.1 g/dL — AB (ref 12.0–15.0)
LYMPHS ABS: 2900 {cells}/uL (ref 850–3900)
LYMPHS PCT: 29 %
MCH: 29.9 pg (ref 27.0–33.0)
MCHC: 33.8 g/dL (ref 32.0–36.0)
MCV: 88.5 fL (ref 80.0–100.0)
MONOS PCT: 4 %
MPV: 11.7 fL (ref 7.5–12.5)
Monocytes Absolute: 400 cells/uL (ref 200–950)
NEUTROS PCT: 64 %
Neutro Abs: 6400 cells/uL (ref 1500–7800)
PLATELETS: 347 10*3/uL (ref 140–400)
RBC: 5.05 MIL/uL (ref 3.80–5.10)
RDW: 13.7 % (ref 11.0–15.0)
WBC: 10 10*3/uL (ref 3.8–10.8)

## 2015-08-14 LAB — COMPREHENSIVE METABOLIC PANEL
ALT: 18 U/L (ref 6–29)
AST: 14 U/L (ref 10–35)
Albumin: 4 g/dL (ref 3.6–5.1)
Alkaline Phosphatase: 93 U/L (ref 33–115)
BILIRUBIN TOTAL: 0.3 mg/dL (ref 0.2–1.2)
BUN: 10 mg/dL (ref 7–25)
CHLORIDE: 103 mmol/L (ref 98–110)
CO2: 26 mmol/L (ref 20–31)
CREATININE: 0.88 mg/dL (ref 0.50–1.10)
Calcium: 9.5 mg/dL (ref 8.6–10.2)
GLUCOSE: 130 mg/dL — AB (ref 70–99)
Potassium: 4.4 mmol/L (ref 3.5–5.3)
SODIUM: 138 mmol/L (ref 135–146)
Total Protein: 6.8 g/dL (ref 6.1–8.1)

## 2015-08-14 LAB — LIPID PANEL
CHOL/HDL RATIO: 4.8 ratio (ref <=5.0)
CHOLESTEROL: 214 mg/dL — AB (ref 125–200)
HDL: 45 mg/dL — AB (ref >=46)
LDL Cholesterol: 114 mg/dL (ref <130)
TRIGLYCERIDES: 277 mg/dL — AB (ref <150)
VLDL: 55 mg/dL — AB (ref <30)

## 2015-08-14 LAB — HEMOGLOBIN A1C
Hgb A1c MFr Bld: 6.5 % — ABNORMAL HIGH (ref <5.7)
MEAN PLASMA GLUCOSE: 140 mg/dL

## 2015-08-14 LAB — TSH: TSH: 3.07 mIU/L

## 2015-08-14 LAB — VITAMIN B12: Vitamin B-12: 372 pg/mL (ref 200–1100)

## 2015-08-14 MED ORDER — GABAPENTIN 100 MG PO CAPS: ORAL_CAPSULE | ORAL | Status: DC

## 2015-08-14 NOTE — Progress Notes (Signed)
Patient ID: Alexandra StainsCandace L Christensen, female   DOB: 05-Nov-1970, 45 y.o.   MRN: [id number]    Subjective:    Patient ID: Alexandra Stainsandace L Christensen, female    DOB: 05-Nov-1970, 45 y.o.   MRN: [id number]  Patient presents for Foot Pain Pt here with with burning tingling to her extremeitesShe states it is getting worse in her feet. They seemed him all the time other times very overly sensitive. It is occurring at nighttime as well as during the day.  She had same symptoms at last visit my diagnosis was neuropathy, she is also glucose intolerant in setting in morbid obesity. Given B complex to take this did not help. Last visit A1C 6.4% amd TG were very high at 358 so zocor was increased  She was prescribed phentermine for obesity-  Weight down only 2lbs since Jan she did not see much benefit from the phentermine and she was not following her diet so she stopped the medication. She also had a couple episodes where she just could not sleep because the medication.  About 2 weeks ago she was actually the car practically getting some adjustments that she had tenderness in all 4 muscles in her neck a lot of stress going home with her husband recently having orthopedic surgery. She did have a couple days where she had some tingling numbness into her left arm as well as a nausea feeling. She did not have any chest pain or shortness of breath so did not go to the emergency room. She's not had any episodes since then.  Review Of Systems:  GEN- denies fatigue, fever, weight loss,weakness, recent illness HEENT- denies eye drainage, change in vision, nasal discharge, CVS- denies chest pain, palpitations RESP- denies SOB, cough, wheeze ABD- denies N/V, change in stools, abd pain GU- denies dysuria, hematuria, dribbling, incontinence MSK-+joint pain, muscle aches, injury Neuro- denies headache, dizziness, syncope, seizure activity       Objective:    BP 132/74 mmHg  Pulse 78  Temp(Src) 98.7 F (37.1 C) (Oral)  Resp 14   Ht 5\' 3"  (1.6 m)  Wt 258 lb (117.028 kg)  BMI 45.71 kg/m2 GEN- NAD, alert and oriented x3 HEENT- PERRL, EOMI, non injected sclera, pink conjunctiva, MMM, oropharynx clear Neck- Supple, no thyromegaly CVS- RRR, no murmur RESP-CTAB ABD-NABS,soft,NT,ND EXT- No edema Neuro- decreased monofilament bilat LE, mild callus in feet, normal propioception  Pulses- Radial, DP- 2+  EKG- NSR, poor r wave in lateral leads      Assessment & Plan:      Problem List Items Addressed This Visit    Obesity   Relevant Orders   TSH   Neuropathy (HCC) - Primary    Will start gabapentin at bedtime start with 100 mg and titrate up to 300 mg      Relevant Orders   TSH   Vitamin B12   Hypertriglyceridemia   Relevant Orders   CBC with Differential/Platelet   Lipid panel   Glucose intolerance (impaired glucose tolerance)    Recheck A1C Plan to start Metformin to help with glucose intolerance and weight  concern she has devloped peripheral neuropathy as well which may be associated with diabetes. Stress proper dietary changes. She strength he mostly sweet tea coffee and soda she is also eating a lot of junk food.      Relevant Orders   CBC with Differential/Platelet   Comprehensive metabolic panel   Hemoglobin A1c    Other Visit Diagnoses    Atypical chest pain  atypical CP, though female with obesity. EKG a little abnormal, but no further episodes, if she gets chest pain or similar symptoms to go to ER. Work on risk fa    Relevant Orders    EKG 12-Lead (Completed)       Note: This dictation was prepared with Nurse, children'sDragon dictation along with smaller Lobbyistphrase technology. Any transcriptional errors that result from this process are unintentional.

## 2015-08-14 NOTE — Patient Instructions (Addendum)
No more than 2 cups of tea a day  No Soda  2 cups of coffee No Fried foods  We will call with lab results  Start with 1 capsule at bedtime, may increase every week, until you get to 3 capsules  F/U 2 months

## 2015-08-14 NOTE — Assessment & Plan Note (Signed)
Recheck A1C Plan to start Metformin to help with glucose intolerance and weight  concern she has devloped peripheral neuropathy as well which may be associated with diabetes. Stress proper dietary changes. She strength he mostly sweet tea coffee and soda she is also eating a lot of junk food.

## 2015-08-14 NOTE — Assessment & Plan Note (Signed)
Will start gabapentin at bedtime start with 100 mg and titrate up to 300 mg

## 2015-08-16 ENCOUNTER — Other Ambulatory Visit: Payer: Self-pay | Admitting: *Deleted

## 2015-08-16 DIAGNOSIS — R7302 Impaired glucose tolerance (oral): Secondary | ICD-10-CM

## 2015-08-16 MED ORDER — METFORMIN HCL 500 MG PO TABS: 500.0000 mg | ORAL_TABLET | Freq: Two times a day (BID) | ORAL | Status: DC

## 2015-08-16 MED ORDER — LANCETS MISC: Status: AC

## 2015-08-16 MED ORDER — BLOOD GLUCOSE TEST VI STRP
ORAL_STRIP | Status: DC
Start: 2015-08-16 — End: 2016-04-19

## 2015-08-16 MED ORDER — BLOOD GLUCOSE SYSTEM PAK KIT: PACK | Status: AC

## 2015-08-16 MED ORDER — LANCET DEVICES MISC: Status: AC

## 2015-09-14 ENCOUNTER — Encounter: Payer: Self-pay | Admitting: Family Medicine

## 2015-09-14 ENCOUNTER — Ambulatory Visit (INDEPENDENT_AMBULATORY_CARE_PROVIDER_SITE_OTHER): Payer: BC Managed Care – PPO | Admitting: Family Medicine

## 2015-09-14 ENCOUNTER — Ambulatory Visit (HOSPITAL_COMMUNITY)
Admission: RE | Admit: 2015-09-14 | Discharge: 2015-09-14 | Disposition: A | Discharge status: Home / Self Care | Payer: BC Managed Care – PPO | Source: Ambulatory Visit | Attending: Family Medicine | Admitting: Family Medicine

## 2015-09-14 VITALS — BP 160/100 | HR 86 | Temp 98.1°F | Resp 18 | Wt 257.0 lb

## 2015-09-14 DIAGNOSIS — M5412 Radiculopathy, cervical region: Secondary | ICD-10-CM | POA: Insufficient documentation

## 2015-09-14 MED ORDER — PREDNISONE 20 MG PO TABS: ORAL_TABLET | ORAL | 0 refills | Status: DC

## 2015-09-14 NOTE — Progress Notes (Signed)
Subjective:    Patient ID: Alexandra Christensen, female    DOB: 05-03-1970, 45 y.o.   MRN: [id number]  HPI  Patient presents with 5 weeks of pain radiating from the right side of her neck into her right shoulder and down into her right hand. She is also having numbness in her right thumb. Today on exam she has a negative Tinel sign. She has a negative Phalen sign. Empty can sign is negative. Hawkins test is negative. She has full internal and external rotation of the shoulder. She has full abduction. However she does have pain reproducible with Spurling's Test. Past medical history is significant for glucose intolerance. Current Outpatient Prescriptions on File Prior to Visit  Medication Sig Dispense Refill  . ALPRAZolam (XANAX) 1 MG tablet Take 1 tablet (1 mg total) by mouth at bedtime as needed for anxiety. 30 tablet 3  . Blood Glucose Monitoring Suppl (BLOOD GLUCOSE SYSTEM PAK) KIT Dispense based on patient and insurance preference. Use to monitor FSBS 1x daily. Dx: E11.9 1 each 1  . Butenafine HCl 1 % cream Apply 1 application topically 2 (two) times daily. 24 g 0  . gabapentin (NEURONTIN) 100 MG capsule Take 1-3 capsules at bedtime 90 capsule 3  . Glucose Blood (BLOOD GLUCOSE TEST STRIPS) STRP Dispense based on patient and insurance preference. Use to monitor FSBS 1x daily. Dx: E11.9 100 each 1  . ibuprofen (ADVIL,MOTRIN) 800 MG tablet Take 1 tablet (800 mg total) by mouth every 8 (eight) hours as needed. 30 tablet 2  . Lancet Devices MISC Dispense based on patient and insurance preference. Use to monitor FSBS 1x daily. Dx: E11.9 1 each 1  . Lancets MISC Dispense based on patient and insurance preference. Use to monitor FSBS 1x daily. Dx: E11.9 100 each 1  . metFORMIN (GLUCOPHAGE) 500 MG tablet Take 1 tablet (500 mg total) by mouth 2 (two) times daily with a meal. 60 tablet 3  . simvastatin (ZOCOR) 40 MG tablet Take 1 tablet (40 mg total) by mouth at bedtime. 30 tablet 3  . traMADol (ULTRAM)  50 MG tablet Take 1 - 2 every 8 hours as needed for pain 60 tablet 0   No current facility-administered medications on file prior to visit.    No Known Allergies Social History   Social History  . Marital status: Single    Spouse name: N/A  . Number of children: N/A  . Years of education: N/A   Occupational History  . Not on file.   Social History Main Topics  . Smoking status: Current Some Day Smoker  . Smokeless tobacco: Never Used  . Alcohol use 0.6 - 1.2 oz/week    1 - 2 Glasses of wine per week     Comment: occasionally  . Drug use: No  . Sexual activity: Yes   Other Topics Concern  . Not on file   Social History Narrative  . No narrative on file     Review of Systems  All other systems reviewed and are negative.      Objective:   Physical Exam  Cardiovascular: Normal rate, regular rhythm and normal heart sounds.   Pulmonary/Chest: Effort normal and breath sounds normal. No respiratory distress. She has no wheezes. She has no rales.  Musculoskeletal:       Right shoulder: She exhibits normal range of motion, no tenderness, no spasm, normal pulse and normal strength.  Vitals reviewed.         Assessment &  Plan:  Cervical radiculopathy, acute - Plan: DG Cervical Spine Complete, predniSONE (DELTASONE) 20 MG tablet  Based on the patient's history and her symptoms as well as her exam, I suspect that she has cervical radiculopathy. I would like the patient to get an x-ray of her cervical spine to evaluate further. Meanwhile start the patient on a prednisone taper pack due to the radicular pain she is having. I expect her sugars be elevated over the next 6-7 days and I cautioned the patient against this. Recheck in one week if no better or sooner if worse

## 2015-09-18 ENCOUNTER — Telehealth: Payer: Self-pay | Admitting: Family Medicine

## 2015-09-18 DIAGNOSIS — M542 Cervicalgia: Secondary | ICD-10-CM

## 2015-09-18 NOTE — Telephone Encounter (Signed)
Order MRI c-spine.

## 2015-09-18 NOTE — Telephone Encounter (Signed)
[phone] Patient calling to say she is still having the same issues as last week even taking the steroids , please call her and advise what she should do

## 2015-09-20 NOTE — Telephone Encounter (Signed)
MRI ordered

## 2015-09-24 ENCOUNTER — Other Ambulatory Visit: Payer: Self-pay | Admitting: Family Medicine

## 2015-09-24 DIAGNOSIS — M25562 Pain in left knee: Secondary | ICD-10-CM

## 2015-09-25 ENCOUNTER — Encounter: Payer: Self-pay | Admitting: Family Medicine

## 2015-09-25 ENCOUNTER — Ambulatory Visit (INDEPENDENT_AMBULATORY_CARE_PROVIDER_SITE_OTHER): Payer: BC Managed Care – PPO | Admitting: Family Medicine

## 2015-09-25 VITALS — BP 138/78 | HR 82 | Temp 98.4°F | Resp 16 | Ht 63.0 in | Wt 251.0 lb

## 2015-09-25 DIAGNOSIS — G629 Polyneuropathy, unspecified: Secondary | ICD-10-CM | POA: Diagnosis not present

## 2015-09-25 DIAGNOSIS — E669 Obesity, unspecified: Secondary | ICD-10-CM | POA: Diagnosis not present

## 2015-09-25 DIAGNOSIS — R7302 Impaired glucose tolerance (oral): Secondary | ICD-10-CM | POA: Diagnosis not present

## 2015-09-25 MED ORDER — GABAPENTIN 300 MG PO CAPS: ORAL_CAPSULE | ORAL | 3 refills | Status: DC

## 2015-09-25 MED ORDER — HYDROCODONE-ACETAMINOPHEN 5-325 MG PO TABS: 1.0000 | ORAL_TABLET | Freq: Four times a day (QID) | ORAL | 0 refills | Status: DC | PRN

## 2015-09-25 NOTE — Assessment & Plan Note (Signed)
Continue to work on weight loss 

## 2015-09-25 NOTE — Patient Instructions (Addendum)
Continue gabapentin  Continue metformin Use norco as prescribed F/U 3 months

## 2015-09-25 NOTE — Assessment & Plan Note (Signed)
Continue  Metformin blood sugars look good, weight also improved

## 2015-09-25 NOTE — Assessment & Plan Note (Signed)
Continue with gabapentin  at bedtime Which will also give some relief to her neck  I also gave her Norco for the cervical radiculopathy has MRI on Wed  D/C reorder for Ultram

## 2015-09-25 NOTE — Progress Notes (Signed)
   Subjective:    Patient ID: Alexandra Christensen, female    DOB: 06/06/1970, 45 y.o.   MRN: [id number]  Patient presents for 6 week F/U (is not fasting) Patient here for interim follow-up on her port on diabetes mellitus. We started her metformin to help with weight loss and keep her blood sugars down her blood sugars range on average 109-120's she did have some elevated blood sugars while she was on prednisone secondary to cervical radiculopathy which she has an MRI scheduled for this week. Her weight is down 7 pounds since her last visit. Her neuropathy did improve with gabapentin 300 mg at bedtime. Now she has had a more difficulty sleeping because of significant neck and shoulder pain. She did request tramadol and had taken some the rest of her old refill but this did not help her neck pain. She's also been using ibuprofen and Tylenol    Review Of Systems:  GEN- denies fatigue, fever, weight loss,weakness, recent illness HEENT- denies eye drainage, change in vision, nasal discharge, CVS- denies chest pain, palpitations RESP- denies SOB, cough, wheeze ABD- denies N/V, change in stools, abd pain GU- denies dysuria, hematuria, dribbling, incontinence MSK- +joint pain, muscle aches, injury Neuro- denies headache, dizziness, syncope, seizure activity       Objective:    BP 138/78 (BP Location: Right Arm, Patient Position: Sitting, Cuff Size: Large)   Pulse 82   Temp 98.4 F (36.9 C) (Oral)   Resp 16   Ht 5\' 3"  (1.6 m)   Wt 251 lb (113.9 kg)   BMI 44.46 kg/m  GEN- NAD, alert and oriented x3 HEENT- PERRL, EOMI, non injected sclera, pink conjunctiva, MMM, oropharynx clear CVS- RRR, no murmur RESP-CTAB Pulses- Radial 2+        Assessment & Plan:      Problem List Items Addressed This Visit    Obesity - Primary    Continue to work on weight loss      Neuropathy (HCC)    Continue with gabapentin 300mg  at bedtime Which will also give some relief to her neck  I also gave  her Norco for the cervical radiculopathy has MRI on Wed  D/C reorder for Ultram      Glucose intolerance (impaired glucose tolerance)    Continue  Metformin blood sugars look good, weight also improved        Other Visit Diagnoses   None.     Note: This dictation was prepared with Dragon dictation along with smaller phrase technology. Any transcriptional errors that result from this process are unintentional.

## 2015-09-27 ENCOUNTER — Ambulatory Visit
Admission: RE | Admit: 2015-09-27 | Discharge: 2015-09-27 | Disposition: A | Discharge status: Home / Self Care | Payer: BC Managed Care – PPO | Source: Ambulatory Visit | Attending: Family Medicine | Admitting: Family Medicine

## 2015-09-27 DIAGNOSIS — M542 Cervicalgia: Secondary | ICD-10-CM

## 2015-09-29 ENCOUNTER — Telehealth: Payer: Self-pay | Admitting: Family Medicine

## 2015-09-29 DIAGNOSIS — M542 Cervicalgia: Secondary | ICD-10-CM

## 2015-09-29 DIAGNOSIS — R937 Abnormal findings on diagnostic imaging of other parts of musculoskeletal system: Secondary | ICD-10-CM

## 2015-09-29 NOTE — Telephone Encounter (Signed)
Pt made aware of MRI results and provider recommendations.  Referral to Dr Ethelene Halamos placed.

## 2015-09-29 NOTE — Telephone Encounter (Signed)
-----   Message from Donita BrooksWarren T Pickard, MD sent at 09/28/2015  6:49 AM EDT ----- 1. 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 Either level could be symptomatic. The potential for right-sided nerve root encroachment seems greatest at C5-6. Consult Dr. Ethelene Halamos for possible epidural steroid injection.

## 2015-10-12 ENCOUNTER — Other Ambulatory Visit: Payer: Self-pay | Admitting: Family Medicine

## 2015-10-12 NOTE — Telephone Encounter (Signed)
Ok to refill??  Last office visit 09/25/2015.  Last refill 03/15/2015, #1 refills.

## 2015-10-13 NOTE — Telephone Encounter (Signed)
ok 

## 2015-10-13 NOTE — Telephone Encounter (Signed)
Medication called to pharmacy. 

## 2015-10-31 ENCOUNTER — Ambulatory Visit: Payer: BC Managed Care – PPO | Admitting: Family Medicine

## 2015-11-09 ENCOUNTER — Other Ambulatory Visit: Payer: Self-pay | Admitting: Family Medicine

## 2015-12-20 ENCOUNTER — Other Ambulatory Visit: Payer: Self-pay | Admitting: Family Medicine

## 2015-12-26 ENCOUNTER — Ambulatory Visit: Payer: BC Managed Care – PPO | Admitting: Family Medicine

## 2015-12-26 ENCOUNTER — Encounter: Payer: Self-pay | Admitting: Family Medicine

## 2015-12-26 ENCOUNTER — Ambulatory Visit (INDEPENDENT_AMBULATORY_CARE_PROVIDER_SITE_OTHER): Payer: BC Managed Care – PPO | Admitting: Family Medicine

## 2015-12-26 VITALS — BP 140/88 | HR 65 | Temp 97.8°F | Resp 18 | Wt 252.0 lb

## 2015-12-26 DIAGNOSIS — I10 Essential (primary) hypertension: Secondary | ICD-10-CM

## 2015-12-26 DIAGNOSIS — IMO0001 Reserved for inherently not codable concepts without codable children: Secondary | ICD-10-CM

## 2015-12-26 DIAGNOSIS — E781 Pure hyperglyceridemia: Secondary | ICD-10-CM | POA: Diagnosis not present

## 2015-12-26 DIAGNOSIS — R7302 Impaired glucose tolerance (oral): Secondary | ICD-10-CM

## 2015-12-26 DIAGNOSIS — Z23 Encounter for immunization: Secondary | ICD-10-CM | POA: Diagnosis not present

## 2015-12-26 DIAGNOSIS — G629 Polyneuropathy, unspecified: Secondary | ICD-10-CM | POA: Diagnosis not present

## 2015-12-26 DIAGNOSIS — F17200 Nicotine dependence, unspecified, uncomplicated: Secondary | ICD-10-CM | POA: Diagnosis not present

## 2015-12-26 DIAGNOSIS — Z6841 Body Mass Index (BMI) 40.0 and over, adult: Secondary | ICD-10-CM | POA: Diagnosis not present

## 2015-12-26 DIAGNOSIS — E6609 Other obesity due to excess calories: Secondary | ICD-10-CM | POA: Diagnosis not present

## 2015-12-26 LAB — COMPREHENSIVE METABOLIC PANEL
ALBUMIN: 4 g/dL (ref 3.6–5.1)
ALK PHOS: 73 U/L (ref 33–115)
ALT: 14 U/L (ref 6–29)
AST: 13 U/L (ref 10–35)
BILIRUBIN TOTAL: 0.4 mg/dL (ref 0.2–1.2)
BUN: 10 mg/dL (ref 7–25)
CALCIUM: 9.3 mg/dL (ref 8.6–10.2)
CO2: 23 mmol/L (ref 20–31)
Chloride: 104 mmol/L (ref 98–110)
Creat: 0.76 mg/dL (ref 0.50–1.10)
Glucose, Bld: 107 mg/dL — ABNORMAL HIGH (ref 70–99)
POTASSIUM: 4.3 mmol/L (ref 3.5–5.3)
Sodium: 138 mmol/L (ref 135–146)
TOTAL PROTEIN: 6.5 g/dL (ref 6.1–8.1)

## 2015-12-26 LAB — CBC WITH DIFFERENTIAL/PLATELET
BASOS PCT: 1 %
Basophils Absolute: 108 cells/uL (ref 0–200)
EOS ABS: 324 {cells}/uL (ref 15–500)
Eosinophils Relative: 3 %
HEMATOCRIT: 43.7 % (ref 35.0–45.0)
HEMOGLOBIN: 14.6 g/dL (ref 12.0–15.0)
LYMPHS ABS: 2916 {cells}/uL (ref 850–3900)
LYMPHS PCT: 27 %
MCH: 30 pg (ref 27.0–33.0)
MCHC: 33.4 g/dL (ref 32.0–36.0)
MCV: 89.9 fL (ref 80.0–100.0)
MONO ABS: 540 {cells}/uL (ref 200–950)
MPV: 11.6 fL (ref 7.5–12.5)
Monocytes Relative: 5 %
Neutro Abs: 6912 cells/uL (ref 1500–7800)
Neutrophils Relative %: 64 %
Platelets: 324 10*3/uL (ref 140–400)
RBC: 4.86 MIL/uL (ref 3.80–5.10)
RDW: 14.1 % (ref 11.0–15.0)
WBC: 10.8 10*3/uL (ref 3.8–10.8)

## 2015-12-26 LAB — LIPID PANEL
CHOL/HDL RATIO: 3.8 ratio (ref <5.0)
CHOLESTEROL: 153 mg/dL (ref <200)
HDL: 40 mg/dL — AB (ref >50)
LDL Cholesterol: 59 mg/dL
TRIGLYCERIDES: 270 mg/dL — AB (ref <150)
VLDL: 54 mg/dL — ABNORMAL HIGH (ref <30)

## 2015-12-26 MED ORDER — LISINOPRIL 5 MG PO TABS: 5.0000 mg | ORAL_TABLET | Freq: Every day | ORAL | 3 refills | Status: DC

## 2015-12-26 MED ORDER — GABAPENTIN 300 MG PO CAPS: ORAL_CAPSULE | ORAL | 6 refills | Status: DC

## 2015-12-26 NOTE — Patient Instructions (Addendum)
F/U 3 months  Lisinopril 5mg  once a day  Gabapentin increased to twice a day Check your blood pressure at home  Call me blood pressure readings after 2 weeks GIVE WORK NOTE

## 2015-12-26 NOTE — Progress Notes (Signed)
Subjective:    Patient ID: Alexandra Christensen, female    DOB: 04/11/1970, 45 y.o.   MRN: [id number]  Patient presents for Diabetes and Hyperlipidemia  Pt here to f/u borderline DM - last A1C in June 6.5%, started on Metformin 500mg  BID, also on Lipitor 20mg  due to hyperlipidemia , LDL 114, TG 277 ( which was improved from 358)   Obesity- previous weight 251lbs , today 252lbs weight unchanged She admits that she is still overeating she has been cooking a lot for different functions therefore tasting all of her food. She's actually been drinking soda and she has been to the beach 3 times over the past couple months and does not like to [employer] [place]. She is not exercising. She is taking her medicine as prescribed.  She does continue to get tingling and numbness in her feet the gabapentin works well but she has symptoms on the day therefore has been taking her gabapentin earlier was not sure she could take it more than once a day to help with symptoms.    Flu shot done  Review Of Systems:  GEN- denies fatigue, fever, weight loss,weakness, recent illness HEENT- denies eye drainage, change in vision, nasal discharge, CVS- denies chest pain, palpitations RESP- denies SOB, cough, wheeze ABD- denies N/V, change in stools, abd pain GU- denies dysuria, hematuria, dribbling, incontinence MSK- denies joint pain, muscle aches, injury Neuro- denies headache, dizziness, syncope, seizure activity       Objective:    BP 140/88   Pulse 65   Temp 97.8 F (36.6 C) (Oral)   Resp 18   Wt 252 lb (114.3 kg)   LMP 12/25/2015 (Exact Date)   SpO2 98%   BMI 44.64 kg/m  GEN- NAD, alert and oriented x3,obese  HEENT- PERRL, EOMI, non injected sclera, pink conjunctiva, MMM, oropharynx clear CVS- RRR, no murmur RESP-CTAB EXT- No edema Pulses- Radial, DP- 2+        Assessment & Plan:      Problem List Items Addressed This Visit    Smoker   Obesity   Neuropathy (HCC)    Increase gabapentin to  300 mg twice a day      Hypertriglyceridemia    She is currently on cholesterol medication and we'll recheck labs today dietary changes need to be made      Relevant Medications   lisinopril (PRINIVIL,ZESTRIL) 5 MG tablet   Other Relevant Orders   Lipid panel   Glucose intolerance (impaired glucose tolerance) - Primary    Recheck A1c her last one was at 6.5% she is currently on metformin which I was trying to start Nordette to help with some weight loss and change in diet but this has not been the case.      Relevant Orders   CBC with Differential/Platelet   Comprehensive metabolic panel   Hemoglobin A1c   Essential hypertension    She's had multiple blood pressure checks which were elevated. At this point with her obesity and her glucose intolerance cholesterol think when he to treat the blood pressure will start on lisinopril 5 mg once a day She's going to check her blood pressure at home and send me some readings over the next 2 weeks      Relevant Medications   lisinopril (PRINIVIL,ZESTRIL) 5 MG tablet    Other Visit Diagnoses    Flu vaccine need       Relevant Orders   Flu Vaccine QUAD 36+ mos IM (Completed)  Note: This dictation was prepared with Dragon dictation along with smaller phrase technology. Any transcriptional errors that result from this process are unintentional.

## 2015-12-26 NOTE — Assessment & Plan Note (Signed)
Increase gabapentin to 300 mg twice a day.

## 2015-12-26 NOTE — Assessment & Plan Note (Signed)
She is currently on cholesterol medication and we'll recheck labs today dietary changes need to be made

## 2015-12-26 NOTE — Assessment & Plan Note (Signed)
She's had multiple blood pressure checks which were elevated. At this point with her obesity and her glucose intolerance cholesterol think when he to treat the blood pressure will start on lisinopril 5 mg once a day She's going to check her blood pressure at home and send me some readings over the next 2 weeks

## 2015-12-26 NOTE — Assessment & Plan Note (Signed)
Recheck A1c her last one was at 6.5% she is currently on metformin which I was trying to start Nordette to help with some weight loss and change in diet but this has not been the case.

## 2015-12-27 LAB — HEMOGLOBIN A1C
HEMOGLOBIN A1C: 6 % — AB (ref <5.7)
Mean Plasma Glucose: 126 mg/dL

## 2015-12-29 ENCOUNTER — Encounter: Payer: Self-pay | Admitting: *Deleted

## 2016-01-04 ENCOUNTER — Ambulatory Visit: Payer: Self-pay | Admitting: Physician Assistant

## 2016-01-15 ENCOUNTER — Telehealth: Payer: Self-pay | Admitting: *Deleted

## 2016-01-15 NOTE — Telephone Encounter (Signed)
Received call from patient.   Reports that she stopped taking Lisinopril d/t dry hacky cough. Reports that she has been off Liisnopril x1 week. States that once she stopped Lisinopril, cough resolved.   Requested new prescription for BP. Patient has not been monitoring BP.   Advised to check BP 2x daily and call back with readings on Wed. Advised to call immediately if BP >150/90.

## 2016-01-18 NOTE — Telephone Encounter (Signed)
Call placed to patient.   States that she has been checking her BP, but she feels the machine is not correct. (averaging 140/90).  Advised to have pharmacist check BP for the next few days and call with readings.

## 2016-01-25 NOTE — Telephone Encounter (Signed)
Call placed to patient.   Reports that BP readings range between 130-140 systolic and 80-90 diastolic.   MD to be made aware.

## 2016-01-26 MED ORDER — LOSARTAN POTASSIUM 25 MG PO TABS: 25.0000 mg | ORAL_TABLET | Freq: Every day | ORAL | 3 refills | Status: DC

## 2016-01-26 NOTE — Telephone Encounter (Signed)
Prescription sent to pharmacy.   Call placed to patient and patient made aware per VM. 

## 2016-01-26 NOTE — Telephone Encounter (Signed)
Instead give Losartan 25mg  once a day

## 2016-03-19 ENCOUNTER — Other Ambulatory Visit: Payer: Self-pay | Admitting: Family Medicine

## 2016-03-29 ENCOUNTER — Ambulatory Visit: Payer: BC Managed Care – PPO | Admitting: Family Medicine

## 2016-04-18 ENCOUNTER — Other Ambulatory Visit: Payer: Self-pay | Admitting: Family Medicine

## 2016-04-19 ENCOUNTER — Other Ambulatory Visit: Payer: Self-pay | Admitting: *Deleted

## 2016-04-19 MED ORDER — BLOOD GLUCOSE TEST VI STRP: ORAL_STRIP | 1 refills | Status: DC

## 2016-05-21 ENCOUNTER — Other Ambulatory Visit: Payer: Self-pay | Admitting: Family Medicine

## 2016-05-22 ENCOUNTER — Other Ambulatory Visit: Payer: Self-pay | Admitting: Family Medicine

## 2016-05-22 NOTE — Telephone Encounter (Signed)
Medication refill for one time only.  Patient needs to be seen.  Letter sent for patient to call and schedule 

## 2016-09-10 ENCOUNTER — Other Ambulatory Visit: Payer: Self-pay | Admitting: Family Medicine

## 2016-09-10 NOTE — Telephone Encounter (Signed)
Ok to refill 

## 2016-09-12 NOTE — Telephone Encounter (Signed)
Medication called to pharmacy. 

## 2016-09-12 NOTE — Telephone Encounter (Signed)
ok 

## 2016-12-13 ENCOUNTER — Other Ambulatory Visit: Payer: Self-pay | Admitting: Family Medicine

## 2016-12-26 ENCOUNTER — Other Ambulatory Visit: Payer: Self-pay

## 2016-12-26 ENCOUNTER — Ambulatory Visit: Payer: BC Managed Care – PPO | Admitting: Family Medicine

## 2016-12-26 ENCOUNTER — Encounter: Payer: Self-pay | Admitting: Family Medicine

## 2016-12-26 VITALS — BP 170/104 | HR 74 | Temp 98.0°F | Resp 16 | Wt 256.0 lb

## 2016-12-26 DIAGNOSIS — L732 Hidradenitis suppurativa: Secondary | ICD-10-CM

## 2016-12-26 MED ORDER — SULFAMETHOXAZOLE-TRIMETHOPRIM 800-160 MG PO TABS
1.0000 | ORAL_TABLET | Freq: Two times a day (BID) | ORAL | 0 refills | Status: DC
Start: 2016-12-26 — End: 2017-06-18

## 2016-12-26 NOTE — Progress Notes (Signed)
Subjective:    Patient ID: Alexandra Christensen, female    DOB: 06-17-1970, 46 y.o.   MRN: [id number]  HPI  Patient presents with several weeks of pain in both axilla left worse than right.  There are numerous erythematous nodules in her left axilla.  There is one erythematous nodule in her right axilla.  All are less than 1 cm in size.  They are all warm and tender.  There is no fluctuance or drainable abscess today.  Blood pressure significantly elevated but patient attributes this to white coat syndrome.  She states that is better at home.  She also attributes it to pain. Past medical history is significant for glucose intolerance. Current Outpatient Medications on File Prior to Visit  Medication Sig Dispense Refill  . Glucose Blood (BLOOD GLUCOSE TEST STRIPS) STRP Use to monitor FSBS 1x daily. Dx: E11.9 100 each 1  . ALPRAZolam (XANAX) 1 MG tablet TAKE ONE TABLET BY MOUTH AT BEDTIME (Patient not taking: Reported on 12/26/2016) 30 tablet 3  . Blood Glucose Monitoring Suppl (BLOOD GLUCOSE SYSTEM PAK) KIT Dispense based on patient and insurance preference. Use to monitor FSBS 1x daily. Dx: E11.9 (Patient not taking: Reported on 12/26/2016) 1 each 1  . Butenafine HCl 1 % cream Apply 1 application topically 2 (two) times daily. (Patient not taking: Reported on 12/26/2016) 24 g 0  . gabapentin (NEURONTIN) 300 MG capsule Take 1  Capsules twice a day (Patient not taking: Reported on 12/26/2016) 60 capsule 6  . HYDROcodone-acetaminophen (NORCO) 5-325 MG tablet Take 1-2 tablets by mouth every 6 (six) hours as needed for moderate pain. (Patient not taking: Reported on 12/26/2015) 45 tablet 0  . Lancet Devices MISC Dispense based on patient and insurance preference. Use to monitor FSBS 1x daily. Dx: E11.9 (Patient not taking: Reported on 12/26/2016) 1 each 1  . Lancets MISC Dispense based on patient and insurance preference. Use to monitor FSBS 1x daily. Dx: E11.9 (Patient not taking: Reported on 12/26/2016) 100  each 1  . losartan (COZAAR) 25 MG tablet Take 1 tablet (25 mg total) by mouth daily. (Patient not taking: Reported on 12/26/2016) 90 tablet 3  . metFORMIN (GLUCOPHAGE) 500 MG tablet TAKE 1 TABLET BY MOUTH TWICE DAILY WITH A MEAL *PATIENT NEEDS OFFICE VISIT AND LABS FOR FURTHER REFILLS* (Patient not taking: Reported on 12/26/2016) 60 tablet 0  . simvastatin (ZOCOR) 40 MG tablet TAKE 1 TABLET BY MOUTH AT BEDTIME (Patient not taking: Reported on 12/26/2016) 30 tablet 0  . traMADol (ULTRAM) 50 MG tablet TAKE ONE TO TWO TABLETS BY MOUTH EVERY 8 HOURS AS NEEDED FOR PAIN (Patient not taking: Reported on 12/26/2016) 60 tablet 0   No current facility-administered medications on file prior to visit.    No Known Allergies Social History   Socioeconomic History  . Marital status: Single    Spouse name: Not on file  . Number of children: Not on file  . Years of education: Not on file  . Highest education level: Not on file  Social Needs  . Financial resource strain: Not on file  . Food insecurity - worry: Not on file  . Food insecurity - inability: Not on file  . Transportation needs - medical: Not on file  . Transportation needs - non-medical: Not on file  Occupational History  . Not on file  Tobacco Use  . Smoking status: Current Some Day Smoker  . Smokeless tobacco: Never Used  Substance and Sexual Activity  . Alcohol use:  Yes    Alcohol/week: 0.6 - 1.2 oz    Types: 1 - 2 Glasses of wine per week    Comment: occasionally  . Drug use: No  . Sexual activity: Yes  Other Topics Concern  . Not on file  Social History Narrative  . Not on file     Review of Systems  All other systems reviewed and are negative.      Objective:   Physical Exam  Cardiovascular: Normal rate, regular rhythm and normal heart sounds.  Pulmonary/Chest: Effort normal and breath sounds normal. No respiratory distress. She has no wheezes. She has no rales.  Musculoskeletal:       Right shoulder: She exhibits  normal range of motion, no tenderness, no spasm, normal pulse and normal strength.  Vitals reviewed.  Left axilla has 10, subcentimeter erythematous nodules which are extremely sore and painful similar to small abscesses, right axilla has one subcentimeter erythematous nodule.  There is no obvious fluctuance or drainable abscess today       Assessment & Plan:  Axillary hidradenitis suppurativa Begin Bactrim double strength tablet 1 p.o. twice daily for 10 days.  Recheck next week.  Consider general surgery consult if worsening.  Patient attributes blood pressure to anxiety and pain.  Recheck blood pressure next week.  If elevated, patient does not have white coat syndrome and I would treat her blood pressure more aggressively.

## 2017-01-02 ENCOUNTER — Encounter: Payer: Self-pay | Admitting: Family Medicine

## 2017-01-02 ENCOUNTER — Ambulatory Visit: Payer: BC Managed Care – PPO | Admitting: Family Medicine

## 2017-01-02 VITALS — BP 132/78 | HR 88 | Temp 98.2°F | Resp 18 | Ht 67.0 in | Wt 259.0 lb

## 2017-01-02 DIAGNOSIS — L732 Hidradenitis suppurativa: Secondary | ICD-10-CM

## 2017-01-02 MED ORDER — DOXYCYCLINE HYCLATE 100 MG PO TABS: 100.0000 mg | ORAL_TABLET | Freq: Two times a day (BID) | ORAL | 0 refills | Status: DC

## 2017-01-02 NOTE — Progress Notes (Signed)
Subjective:    Patient ID: Alexandra Christensen, female    DOB: 1970/05/25, 46 y.o.   MRN: [id number]  HPI 12/26/16 Patient presents with several weeks of pain in both axilla left worse than right.  There are numerous erythematous nodules in her left axilla.  There is one erythematous nodule in her right axilla.  All are less than 1 cm in size.  They are all warm and tender.  There is no fluctuance or drainable abscess today.  Blood pressure significantly elevated but patient attributes this to white coat syndrome.  She states that is better at home.  She also attributes it to pain. Past medical history is significant for glucose intolerance.  At that time, my plan was: Begin Bactrim double strength tablet 1 p.o. twice daily for 10 days.  Recheck next week.  Consider general surgery consult if worsening.  Patient attributes blood pressure to anxiety and pain.  Recheck blood pressure next week.  If elevated, patient does not have white coat syndrome and I would treat her blood pressure more aggressively.  01/02/17 The lesion in the patient's armpits look dramatically better.  There is only one residual nodule in the upper left axilla there is approximately 1 cm in diameter.  There is hyper pigmented changes to the skin where the other nodules were present but the nodules have completely resolved.  In the right axilla there is one nodule approximately 1 cm in diameter that is erythematous but the other nodules has resolved.  I have offered the patient incision and drainage but she would like to try an additional round of doxycycline.   Current Outpatient Medications on File Prior to Visit  Medication Sig Dispense Refill  . ALPRAZolam (XANAX) 1 MG tablet TAKE ONE TABLET BY MOUTH AT BEDTIME 30 tablet 3  . Blood Glucose Monitoring Suppl (BLOOD GLUCOSE SYSTEM PAK) KIT Dispense based on patient and insurance preference. Use to monitor FSBS 1x daily. Dx: E11.9 1 each 1  . Butenafine HCl 1 % cream Apply 1  application topically 2 (two) times daily. 24 g 0  . gabapentin (NEURONTIN) 300 MG capsule Take 1  Capsules twice a day 60 capsule 6  . Glucose Blood (BLOOD GLUCOSE TEST STRIPS) STRP Use to monitor FSBS 1x daily. Dx: E11.9 100 each 1  . HYDROcodone-acetaminophen (NORCO) 5-325 MG tablet Take 1-2 tablets by mouth every 6 (six) hours as needed for moderate pain. 45 tablet 0  . Lancet Devices MISC Dispense based on patient and insurance preference. Use to monitor FSBS 1x daily. Dx: E11.9 1 each 1  . Lancets MISC Dispense based on patient and insurance preference. Use to monitor FSBS 1x daily. Dx: E11.9 100 each 1  . losartan (COZAAR) 25 MG tablet Take 1 tablet (25 mg total) by mouth daily. 90 tablet 3  . metFORMIN (GLUCOPHAGE) 500 MG tablet TAKE 1 TABLET BY MOUTH TWICE DAILY WITH A MEAL *PATIENT NEEDS OFFICE VISIT AND LABS FOR FURTHER REFILLS* 60 tablet 0  . simvastatin (ZOCOR) 40 MG tablet TAKE 1 TABLET BY MOUTH AT BEDTIME 30 tablet 0  . sulfamethoxazole-trimethoprim (BACTRIM DS,SEPTRA DS) 800-160 MG tablet Take 1 tablet 2 (two) times daily by mouth. 20 tablet 0  . traMADol (ULTRAM) 50 MG tablet TAKE ONE TO TWO TABLETS BY MOUTH EVERY 8 HOURS AS NEEDED FOR PAIN 60 tablet 0   No current facility-administered medications on file prior to visit.    No Known Allergies Social History   Socioeconomic History  . Marital  status: Single    Spouse name: Not on file  . Number of children: Not on file  . Years of education: Not on file  . Highest education level: Not on file  Social Needs  . Financial resource strain: Not on file  . Food insecurity - worry: Not on file  . Food insecurity - inability: Not on file  . Transportation needs - medical: Not on file  . Transportation needs - non-medical: Not on file  Occupational History  . Not on file  Tobacco Use  . Smoking status: Current Some Day Smoker  . Smokeless tobacco: Never Used  Substance and Sexual Activity  . Alcohol use: Yes     Alcohol/week: 0.6 - 1.2 oz    Types: 1 - 2 Glasses of wine per week    Comment: occasionally  . Drug use: No  . Sexual activity: Yes  Other Topics Concern  . Not on file  Social History Narrative  . Not on file     Review of Systems  All other systems reviewed and are negative.      Objective:   Physical Exam  Cardiovascular: Normal rate, regular rhythm and normal heart sounds.  Pulmonary/Chest: Effort normal and breath sounds normal. No respiratory distress. She has no wheezes. She has no rales.  Musculoskeletal:       Right shoulder: She exhibits normal range of motion, no tenderness, no spasm, normal pulse and normal strength.  Vitals reviewed.  See HPI    Assessment & Plan:   Hidradenitis Suppurtiva Much better.  Take doxycycline 100 mg p.o. twice daily for 14 days.  If lesions persist, I would recommend incision and drainage.  If outbreaks become recurrent, consider antiandrogen therapy with spironolactone, or once or twice weekly washes with chlorhexidine as a preventative.  Avoid skin trauma and consider instead of shaving her axilla switching to [name]

## 2017-02-05 ENCOUNTER — Other Ambulatory Visit: Payer: Self-pay | Admitting: Family Medicine

## 2017-02-05 NOTE — Telephone Encounter (Signed)
Ok to refill 

## 2017-02-06 NOTE — Telephone Encounter (Signed)
HOW is her rash?  She was using this for what I thought might be rosacea.  Never heard back from her.

## 2017-02-13 ENCOUNTER — Other Ambulatory Visit: Payer: Self-pay | Admitting: Family Medicine

## 2017-02-17 ENCOUNTER — Other Ambulatory Visit: Payer: Self-pay | Admitting: Family Medicine

## 2017-02-17 ENCOUNTER — Telehealth: Payer: Self-pay | Admitting: Family Medicine

## 2017-02-17 MED ORDER — DOXYCYCLINE HYCLATE 100 MG PO TABS: 100.0000 mg | ORAL_TABLET | Freq: Two times a day (BID) | ORAL | 0 refills | Status: DC

## 2017-02-17 NOTE — Telephone Encounter (Signed)
I escribed doxy.

## 2017-02-17 NOTE — Telephone Encounter (Signed)
Pt is calling for refill of Doxy that her arms are breaking out again. OK to refill?

## 2017-03-04 ENCOUNTER — Other Ambulatory Visit: Payer: Self-pay | Admitting: Family Medicine

## 2017-04-02 ENCOUNTER — Other Ambulatory Visit: Payer: Self-pay | Admitting: Family Medicine

## 2017-05-28 ENCOUNTER — Other Ambulatory Visit: Payer: Self-pay | Admitting: Family Medicine

## 2017-05-28 NOTE — Telephone Encounter (Signed)
Requesting refill Doxycycline - looks like she takes it for Hidradenitis Suppurativa but wanted to get an ok before refilling.

## 2017-06-09 ENCOUNTER — Other Ambulatory Visit: Payer: Self-pay | Admitting: Family Medicine

## 2017-06-12 ENCOUNTER — Other Ambulatory Visit: Payer: Self-pay | Admitting: Family Medicine

## 2017-06-18 ENCOUNTER — Ambulatory Visit: Payer: BC Managed Care – PPO | Admitting: Family Medicine

## 2017-06-18 ENCOUNTER — Other Ambulatory Visit: Payer: Self-pay

## 2017-06-18 ENCOUNTER — Encounter: Payer: Self-pay | Admitting: Family Medicine

## 2017-06-18 VITALS — BP 122/64 | HR 76 | Temp 98.0°F | Resp 14 | Ht 67.0 in | Wt 271.0 lb

## 2017-06-18 DIAGNOSIS — Z114 Encounter for screening for human immunodeficiency virus [HIV]: Secondary | ICD-10-CM | POA: Diagnosis not present

## 2017-06-18 DIAGNOSIS — Z6841 Body Mass Index (BMI) 40.0 and over, adult: Secondary | ICD-10-CM

## 2017-06-18 DIAGNOSIS — Z87828 Personal history of other (healed) physical injury and trauma: Secondary | ICD-10-CM

## 2017-06-18 DIAGNOSIS — I10 Essential (primary) hypertension: Secondary | ICD-10-CM | POA: Diagnosis not present

## 2017-06-18 DIAGNOSIS — G629 Polyneuropathy, unspecified: Secondary | ICD-10-CM

## 2017-06-18 DIAGNOSIS — E781 Pure hyperglyceridemia: Secondary | ICD-10-CM | POA: Diagnosis not present

## 2017-06-18 DIAGNOSIS — R5383 Other fatigue: Secondary | ICD-10-CM | POA: Diagnosis not present

## 2017-06-18 DIAGNOSIS — R7302 Impaired glucose tolerance (oral): Secondary | ICD-10-CM | POA: Diagnosis not present

## 2017-06-18 DIAGNOSIS — Z806 Family history of leukemia: Secondary | ICD-10-CM

## 2017-06-18 LAB — COMPREHENSIVE METABOLIC PANEL
AG Ratio: 1.5 (calc) (ref 1.0–2.5)
ALT: 17 U/L (ref 6–29)
AST: 15 U/L (ref 10–35)
Albumin: 4.1 g/dL (ref 3.6–5.1)
Alkaline phosphatase (APISO): 92 U/L (ref 33–115)
BUN: 13 mg/dL (ref 7–25)
CO2: 26 mmol/L (ref 20–32)
CREATININE: 0.84 mg/dL (ref 0.50–1.10)
Calcium: 9.4 mg/dL (ref 8.6–10.2)
Chloride: 103 mmol/L (ref 98–110)
GLUCOSE: 135 mg/dL — AB (ref 65–99)
Globulin: 2.7 g/dL (calc) (ref 1.9–3.7)
Potassium: 4.4 mmol/L (ref 3.5–5.3)
Sodium: 136 mmol/L (ref 135–146)
Total Bilirubin: 0.4 mg/dL (ref 0.2–1.2)
Total Protein: 6.8 g/dL (ref 6.1–8.1)

## 2017-06-18 LAB — LIPID PANEL
CHOL/HDL RATIO: 4 (calc) (ref <5.0)
Cholesterol: 181 mg/dL (ref <200)
HDL: 45 mg/dL — AB (ref >50)
LDL Cholesterol (Calc): 99 mg/dL (calc)
Non-HDL Cholesterol (Calc): 136 mg/dL (calc) — ABNORMAL HIGH (ref <130)
TRIGLYCERIDES: 258 mg/dL — AB (ref <150)

## 2017-06-18 LAB — CBC WITH DIFFERENTIAL/PLATELET
BASOS ABS: 58 {cells}/uL (ref 0–200)
BASOS PCT: 0.5 %
EOS ABS: 244 {cells}/uL (ref 15–500)
Eosinophils Relative: 2.1 %
HCT: 42.7 % (ref 35.0–45.0)
Hemoglobin: 14.7 g/dL (ref 11.7–15.5)
Lymphs Abs: 2564 cells/uL (ref 850–3900)
MCH: 30.5 pg (ref 27.0–33.0)
MCHC: 34.4 g/dL (ref 32.0–36.0)
MCV: 88.6 fL (ref 80.0–100.0)
MONOS PCT: 4.5 %
MPV: 11.7 fL (ref 7.5–12.5)
Neutro Abs: 8213 cells/uL — ABNORMAL HIGH (ref 1500–7800)
Neutrophils Relative %: 70.8 %
PLATELETS: 307 10*3/uL (ref 140–400)
RBC: 4.82 10*6/uL (ref 3.80–5.10)
RDW: 13.2 % (ref 11.0–15.0)
TOTAL LYMPHOCYTE: 22.1 %
WBC mixed population: 522 cells/uL (ref 200–950)
WBC: 11.6 10*3/uL — ABNORMAL HIGH (ref 3.8–10.8)

## 2017-06-18 LAB — TSH: TSH: 3.33 mIU/L

## 2017-06-18 NOTE — Assessment & Plan Note (Signed)
No change to losartan.  We will check her fasting labs today for her cholesterol.

## 2017-06-18 NOTE — Assessment & Plan Note (Signed)
Continue gabapentin.

## 2017-06-18 NOTE — Assessment & Plan Note (Signed)
Time that she is now full-blown diabetic especially with the extra 20 pound weight gain in the setting of her morbid obesity.  She still on the metformin will adjust this to get her A1c'sbelow 7%.

## 2017-06-18 NOTE — Progress Notes (Signed)
Subjective:    Patient ID: Alexandra Christensen, female    DOB: [birth year], 47 y.o.   MRN: [id number]  Patient presents for Follow-up (is fasting)  She here for follow-up.  She was last seen by me for her general medical problems back in November 2017.  She has glucose intolerance to the start of her morbid obesity also with hypertension.  She is overdue for fasting labs when she is prepared for that for today.  She is actually gained 20 pounds in the past year and a half.  She admits to not controlling her portions she does eat out some but drinks a lot of her calories.  She states that she has not been in because her sister was diagnosed with AML she has been flying back and forth to [place] to help her get treatments.  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  She would also like to try something to help with her weight loss again she had done phentermine in the past.  Neuropathy she continues to take gabapentin as well as to [place] and this helps.  Anxiety she rarely uses her alprazolam  With regards to her medications he has been using metformin that was left over from a couple family members she is also been taking losartan   Review Of Systems:  GEN- +fatigue, fever, weight loss,weakness, recent illness HEENT- denies eye drainage, change in vision, nasal discharge, CVS- denies chest pain, palpitations RESP- denies SOB, cough, wheeze ABD- denies N/V, change in stools, abd pain GU- denies dysuria, hematuria, dribbling, incontinence MSK- denies joint pain, muscle aches, injury Neuro- denies headache, dizziness, syncope, seizure activity       Objective:    BP 122/64   Pulse 76   Temp 98 F (36.7 C) (Oral)   Resp 14   Ht  (1.702 m)   Wt 271 lb (122.9 kg)   SpO2 97%   BMI 42.44 kg/m  GEN- NAD, alert and oriented  x3,obese  HEENT- PERRL, EOMI, non injected sclera, pink conjunctiva, MMM, oropharynx clear Neck- Supple, no thyromegaly CVS- RRR, no murmur RESP-CTAB ABD-NABS,soft,NT,ND EXT- No edema Pulses- Radial, DP- 2+        Assessment & Plan:      Problem List Items Addressed This Visit      Unprioritized   Obesity - Primary   Hypertriglyceridemia   Relevant Orders   Lipid panel   Neuropathy    Continue gabapentin      Glucose intolerance (impaired glucose tolerance)    Time that she is now full-blown diabetic especially with the extra 20 pound weight gain in the setting of her morbid obesity.  She still on the metformin will adjust this to get her A1c'sbelow 7%.      Relevant Orders   Hemoglobin A1c   Microalbumin / creatinine urine ratio   Essential hypertension    No change to losartan.  We will check her fasting labs today for her cholesterol.      Relevant Orders   CBC with Differential/Platelet   Comprehensive metabolic panel    Other Visit Diagnoses    Other fatigue       Relevant Orders   TSH   History of spleen injury       Relevant Orders   CEA   Cancer  Antigen 19-9   CA 125   Family history of leukemia       History of leukemia recheck her cancer levels markers.  HIV screening also done   Relevant Orders   CEA   Cancer Antigen 19-9   CA 125   Encounter for screening for HIV       Relevant Orders   HIV antibody      Note: This dictation was prepared with Dragon dictation along with smaller phrase technology. Any transcriptional errors that result from this process are unintentional.

## 2017-06-18 NOTE — Patient Instructions (Signed)
F/U  3 months 

## 2017-06-19 LAB — HEMOGLOBIN A1C
EAG (MMOL/L): 8.7 (calc)
Hgb A1c MFr Bld: 7.1 % of total Hgb — ABNORMAL HIGH (ref <5.7)
MEAN PLASMA GLUCOSE: 157 (calc)

## 2017-06-19 LAB — MICROALBUMIN / CREATININE URINE RATIO
Creatinine, Urine: 116 mg/dL (ref 20–275)
MICROALB UR: 0.6 mg/dL
MICROALB/CREAT RATIO: 5 ug/mg{creat} (ref <30)

## 2017-06-19 LAB — HIV ANTIBODY (ROUTINE TESTING W REFLEX): HIV 1&2 Ab, 4th Generation: NONREACTIVE

## 2017-06-20 LAB — CEA: CEA: 2.7 ng/mL — ABNORMAL HIGH

## 2017-06-20 LAB — CA 125: CA 125: 7 U/mL (ref <35)

## 2017-06-20 LAB — CANCER ANTIGEN 19-9: CA 19 9: 14 U/mL (ref <34)

## 2017-06-23 ENCOUNTER — Telehealth: Payer: Self-pay | Admitting: *Deleted

## 2017-06-23 ENCOUNTER — Other Ambulatory Visit: Payer: Self-pay | Admitting: *Deleted

## 2017-06-23 MED ORDER — METFORMIN HCL 1000 MG PO TABS: 1000.0000 mg | ORAL_TABLET | Freq: Two times a day (BID) | ORAL | 3 refills | Status: DC

## 2017-06-23 MED ORDER — SIMVASTATIN 40 MG PO TABS: 40.0000 mg | ORAL_TABLET | Freq: Every day | ORAL | 0 refills | Status: DC

## 2017-06-23 NOTE — Telephone Encounter (Signed)
Received call from patient.   Requested MD to advise on Phentermine. Reports that MD was awaiting lab results before restarting medication.   MD please advise.

## 2017-06-24 MED ORDER — PHENTERMINE HCL 37.5 MG PO CAPS: ORAL_CAPSULE | ORAL | 3 refills | Status: DC

## 2017-06-24 NOTE — Telephone Encounter (Signed)
Okay to start phentermine 37.5mg  once a day  She needs to take 1/2 tablet for 1 week then move up to full tablet

## 2017-06-24 NOTE — Telephone Encounter (Signed)
Medication pended for MD signature.   Call placed to patient and patient made aware.

## 2017-06-25 ENCOUNTER — Other Ambulatory Visit: Payer: Self-pay | Admitting: Family Medicine

## 2017-07-25 ENCOUNTER — Telehealth: Payer: Self-pay | Admitting: *Deleted

## 2017-07-25 NOTE — Telephone Encounter (Signed)
Call placed to patient and patient made aware.   States that she will stop Zocor, but she will resume fish oil.

## 2017-07-25 NOTE — Telephone Encounter (Signed)
Received call from patient.   States that she resumed the statin medication again per MD recommendations, but she continues to have increased leg pain and muscle aches.   Patient has stopped Zocor and is requesting MD to advise.

## 2017-07-25 NOTE — Telephone Encounter (Signed)
If legs improved after zocor, then stop for now If no improvement, then she needs a f/u appointment to be re- evaluated

## 2017-07-26 ENCOUNTER — Other Ambulatory Visit: Payer: Self-pay | Admitting: Family Medicine

## 2017-07-28 ENCOUNTER — Other Ambulatory Visit: Payer: Self-pay | Admitting: Family Medicine

## 2017-07-28 NOTE — Telephone Encounter (Signed)
Ok to refill??  Last office visit 06/18/2017.  Last refill 06/24/2017.

## 2017-07-30 ENCOUNTER — Encounter: Payer: Self-pay | Admitting: *Deleted

## 2017-08-25 ENCOUNTER — Encounter: Payer: Self-pay | Admitting: Family Medicine

## 2017-08-25 ENCOUNTER — Ambulatory Visit: Payer: BC Managed Care – PPO | Admitting: Family Medicine

## 2017-08-25 VITALS — BP 130/80 | HR 96 | Temp 98.1°F | Resp 16 | Ht 67.0 in | Wt 258.0 lb

## 2017-08-25 DIAGNOSIS — K529 Noninfective gastroenteritis and colitis, unspecified: Secondary | ICD-10-CM | POA: Diagnosis not present

## 2017-08-25 DIAGNOSIS — K921 Melena: Secondary | ICD-10-CM | POA: Diagnosis not present

## 2017-08-25 LAB — CBC WITH DIFFERENTIAL/PLATELET
BASOS ABS: 46 {cells}/uL (ref 0–200)
Basophils Relative: 0.5 %
Eosinophils Absolute: 175 cells/uL (ref 15–500)
Eosinophils Relative: 1.9 %
HCT: 43.4 % (ref 35.0–45.0)
Hemoglobin: 14.8 g/dL (ref 11.7–15.5)
Lymphs Abs: 2815 cells/uL (ref 850–3900)
MCH: 29.8 pg (ref 27.0–33.0)
MCHC: 34.1 g/dL (ref 32.0–36.0)
MCV: 87.5 fL (ref 80.0–100.0)
MPV: 11.6 fL (ref 7.5–12.5)
Monocytes Relative: 5.2 %
Neutro Abs: 5686 cells/uL (ref 1500–7800)
Neutrophils Relative %: 61.8 %
PLATELETS: 333 10*3/uL (ref 140–400)
RBC: 4.96 10*6/uL (ref 3.80–5.10)
RDW: 13.2 % (ref 11.0–15.0)
TOTAL LYMPHOCYTE: 30.6 %
WBC: 9.2 10*3/uL (ref 3.8–10.8)
WBCMIX: 478 {cells}/uL (ref 200–950)

## 2017-08-25 LAB — COMPLETE METABOLIC PANEL WITH GFR
AG RATIO: 1.5 (calc) (ref 1.0–2.5)
ALT: 69 U/L — AB (ref 6–29)
AST: 44 U/L — AB (ref 10–35)
Albumin: 4.1 g/dL (ref 3.6–5.1)
Alkaline phosphatase (APISO): 89 U/L (ref 33–115)
BILIRUBIN TOTAL: 0.5 mg/dL (ref 0.2–1.2)
BUN: 11 mg/dL (ref 7–25)
CHLORIDE: 103 mmol/L (ref 98–110)
CO2: 23 mmol/L (ref 20–32)
Calcium: 9.5 mg/dL (ref 8.6–10.2)
Creat: 0.95 mg/dL (ref 0.50–1.10)
GFR, Est African American: 82 mL/min/{1.73_m2} (ref >=60)
GFR, Est Non African American: 70 mL/min/{1.73_m2} (ref >=60)
Globulin: 2.7 g/dL (calc) (ref 1.9–3.7)
Glucose, Bld: 141 mg/dL — ABNORMAL HIGH (ref 65–99)
Potassium: 4.1 mmol/L (ref 3.5–5.3)
Sodium: 139 mmol/L (ref 135–146)
Total Protein: 6.8 g/dL (ref 6.1–8.1)

## 2017-08-25 LAB — SEDIMENTATION RATE: SED RATE: 25 mm/h — AB (ref 0–20)

## 2017-08-25 MED ORDER — METRONIDAZOLE 500 MG PO TABS: 500.0000 mg | ORAL_TABLET | Freq: Two times a day (BID) | ORAL | 0 refills | Status: DC

## 2017-08-25 MED ORDER — CIPROFLOXACIN HCL 500 MG PO TABS: 500.0000 mg | ORAL_TABLET | Freq: Two times a day (BID) | ORAL | 0 refills | Status: DC

## 2017-08-25 NOTE — Progress Notes (Signed)
Subjective:    Patient ID: Beatriz Stallion, female    DOB: 1970-10-21, 47 y.o.   MRN: [id number]  HPI Patient has been diagnosed in the past with diarrhea predominant irritable bowel syndrome.  However she is also been diagnosed in the past with acute colitis and treated with Flagyl and Cipro.  She states that over the last 1 to 2 weeks, she has had copious bloody diarrhea.  At times, she will defecate and produce nothing but blood.  The blood is mixed in with the stool, it is not just on the toilet tissue when she wipes.  She denies any chest pain shortness of breath or dyspnea on exertion.  She denies any syncope or lightheadedness.  Her blood pressure stable.  She denies any hematemesis.  However she is having frequent episodes of diarrhea.  She states that she may have lost 7 pounds over the last 2 weeks.  She is also reporting crampy left lower quadrant abdominal pain and she is tender to palpation there on exam today Past Medical History:  Diagnosis Date  . Diabetes mellitus without complication (Why)   . Hyperlipidemia   . Hypertension    Past Surgical History:  Procedure Laterality Date  . TEE WITHOUT CARDIOVERSION N/A 06/16/2013   Procedure: TRANSESOPHAGEAL ECHOCARDIOGRAM (TEE);  Surgeon: Pixie Casino, MD;  [facility] ENDOSCOPY;  Service: Cardiovascular;  Laterality: N/A;   Current Outpatient Medications on File Prior to Visit  Medication Sig Dispense Refill  . ALPRAZolam (XANAX) 1 MG tablet TAKE ONE TABLET BY MOUTH AT BEDTIME 30 tablet 3  . Blood Glucose Monitoring Suppl (BLOOD GLUCOSE SYSTEM PAK) KIT Dispense based on patient and insurance preference. Use to monitor FSBS 1x daily. Dx: E11.9 1 each 1  . gabapentin (NEURONTIN) 300 MG capsule TAKE ONE CAPSULE BY MOUTH TWICE DAILY 60 capsule 6  . glucose blood (ONE TOUCH ULTRA TEST) test strip USE TO CHECK FASTING BLOOD GLUCOSE ONCE DAILY 50 each 3  . ibuprofen (ADVIL,MOTRIN) 800 MG tablet TAKE 1 TABLET BY MOUTH EVERY 8 HOURS  AS NEEDED 30 tablet 2  . Lancet Devices MISC Dispense based on patient and insurance preference. Use to monitor FSBS 1x daily. Dx: E11.9 1 each 1  . Lancets MISC Dispense based on patient and insurance preference. Use to monitor FSBS 1x daily. Dx: E11.9 100 each 1  . losartan (COZAAR) 25 MG tablet TAKE ONE TABLET BY MOUTH ONCE DAILY 90 tablet 3  . metFORMIN (GLUCOPHAGE) 1000 MG tablet Take 1 tablet (1,000 mg total) by mouth 2 (two) times daily with a meal. 180 tablet 3  . phentermine (ADIPEX-P) 37.5 MG tablet TAKE ONE TABLET IN THE MORNING 30 tablet 1   No current facility-administered medications on file prior to visit.    No Known Allergies  Social History   Socioeconomic History  . Marital status: Single    Spouse name: Not on file  . Number of children: Not on file  . Years of education: Not on file  . Highest education level: Not on file  Occupational History  . Not on file  Social Needs  . Financial resource strain: Not on file  . Food insecurity:    Worry: Not on file    Inability: Not on file  . Transportation needs:    Medical: Not on file    Non-medical: Not on file  Tobacco Use  . Smoking status: Former Smoker    Types: Cigarettes    Last attempt to quit: 03/21/2017  Years since quitting: 0.4  . Smokeless tobacco: Never Used  Substance and Sexual Activity  . Alcohol use: Yes    Alcohol/week: 0.6 - 1.2 oz    Types: 1 - 2 Glasses of wine per week    Comment: occasionally  . Drug use: No  . Sexual activity: Yes  Lifestyle  . Physical activity:    Days per week: Not on file    Minutes per session: Not on file  . Stress: Not on file  Relationships  . Social connections:    Talks on phone: Not on file    Gets together: Not on file    Attends religious service: Not on file    Active member of club or organization: Not on file    Attends meetings of clubs or organizations: Not on file    Relationship status: Not on file  . Intimate partner violence:    Fear  of current or ex partner: Not on file    Emotionally abused: Not on file    Physically abused: Not on file    Forced sexual activity: Not on file  Other Topics Concern  . Not on file  Social History Narrative  . Not on file   Family History  Problem Relation Age of Onset  . Diabetes Mother   . Heart disease Mother   . Kidney disease Father   . Heart disease Father   . Stroke Sister 49  . Cancer Sister        AML      Review of Systems  All other systems reviewed and are negative.      Objective:   Physical Exam  Cardiovascular: Normal rate, regular rhythm and normal heart sounds.  Pulmonary/Chest: Effort normal and breath sounds normal. No stridor. No respiratory distress. She has no wheezes. She has no rales.  Abdominal: Soft. Bowel sounds are normal. She exhibits no distension and no mass. There is tenderness. There is no rebound and no guarding.  Vitals reviewed.         Assessment & Plan:  Colitis - Plan: CBC with Differential/Platelet, COMPLETE METABOLIC PANEL WITH GFR, Sedimentation rate, ciprofloxacin (CIPRO) 500 MG tablet, metroNIDAZOLE (FLAGYL) 500 MG tablet  Hematochezia  Given her symptoms, I believe she has colitis either infectious versus inflammatory.  I recommended treating infectious colitis with Cipro 500 mg p.o. twice daily for 10 days and Flagyl 500 mg p.o. twice daily for 10 days.  I have recommended discontinuation of metformin in case this is causing some of the diarrhea.  Rectal exam today is unremarkable other than increase sphincter tone.  There is no perirectal mass.  There is no visible bleeding hemorrhoid.  There is no anal fissure.  Therefore I am going to consult GI because I am concerned about inflammatory colitis.  Meanwhile I will check a CBC to monitor for severe anemia as well as for leukocytosis.  I will also check a sed rate and a CMP.

## 2017-08-28 ENCOUNTER — Telehealth: Payer: Self-pay | Admitting: Family Medicine

## 2017-08-28 NOTE — Telephone Encounter (Signed)
I would proceed with CT unless markedly better

## 2017-08-28 NOTE — Telephone Encounter (Signed)
Pt called and states that she is still having some abd pain, diarrhea and passing blood. She states that it is getting better but not gone. She wanted to know if she should wait a while longer or do you want her to get the CT?

## 2017-08-29 ENCOUNTER — Other Ambulatory Visit: Payer: Self-pay | Admitting: Family Medicine

## 2017-08-29 DIAGNOSIS — R7989 Other specified abnormal findings of blood chemistry: Secondary | ICD-10-CM

## 2017-08-29 DIAGNOSIS — R945 Abnormal results of liver function studies: Principal | ICD-10-CM

## 2017-08-29 NOTE — Telephone Encounter (Signed)
Called and spoke to pt and she is doing much better today and would like to wait on the CT scan - if she gets worse will call back next for the CT scan.

## 2017-09-04 ENCOUNTER — Encounter: Payer: Self-pay | Admitting: Family Medicine

## 2017-09-10 ENCOUNTER — Other Ambulatory Visit: Payer: BC Managed Care – PPO

## 2017-09-10 DIAGNOSIS — R7989 Other specified abnormal findings of blood chemistry: Secondary | ICD-10-CM

## 2017-09-10 DIAGNOSIS — R945 Abnormal results of liver function studies: Principal | ICD-10-CM

## 2017-09-11 ENCOUNTER — Encounter (INDEPENDENT_AMBULATORY_CARE_PROVIDER_SITE_OTHER): Payer: Self-pay

## 2017-09-11 LAB — COMPREHENSIVE METABOLIC PANEL
AG Ratio: 1.4 (calc) (ref 1.0–2.5)
ALT: 27 U/L (ref 6–29)
AST: 21 U/L (ref 10–35)
Albumin: 3.7 g/dL (ref 3.6–5.1)
Alkaline phosphatase (APISO): 90 U/L (ref 33–115)
BUN: 13 mg/dL (ref 7–25)
CALCIUM: 9.3 mg/dL (ref 8.6–10.2)
CO2: 26 mmol/L (ref 20–32)
CREATININE: 1.06 mg/dL (ref 0.50–1.10)
Chloride: 100 mmol/L (ref 98–110)
GLOBULIN: 2.7 g/dL (ref 1.9–3.7)
Glucose, Bld: 177 mg/dL — ABNORMAL HIGH (ref 65–99)
Potassium: 4.5 mmol/L (ref 3.5–5.3)
Sodium: 138 mmol/L (ref 135–146)
Total Bilirubin: 0.3 mg/dL (ref 0.2–1.2)
Total Protein: 6.4 g/dL (ref 6.1–8.1)

## 2017-09-19 ENCOUNTER — Ambulatory Visit: Payer: BC Managed Care – PPO | Admitting: Family Medicine

## 2017-09-23 ENCOUNTER — Encounter: Payer: Self-pay | Admitting: Family Medicine

## 2017-09-23 ENCOUNTER — Other Ambulatory Visit: Payer: Self-pay

## 2017-09-23 ENCOUNTER — Ambulatory Visit: Payer: BC Managed Care – PPO | Admitting: Family Medicine

## 2017-09-23 VITALS — BP 138/88 | HR 82 | Temp 98.1°F | Resp 14 | Ht 67.0 in | Wt 261.0 lb

## 2017-09-23 DIAGNOSIS — Z6841 Body Mass Index (BMI) 40.0 and over, adult: Secondary | ICD-10-CM

## 2017-09-23 DIAGNOSIS — E1149 Type 2 diabetes mellitus with other diabetic neurological complication: Secondary | ICD-10-CM

## 2017-09-23 DIAGNOSIS — G629 Polyneuropathy, unspecified: Secondary | ICD-10-CM | POA: Diagnosis not present

## 2017-09-23 DIAGNOSIS — E781 Pure hyperglyceridemia: Secondary | ICD-10-CM

## 2017-09-23 DIAGNOSIS — I10 Essential (primary) hypertension: Secondary | ICD-10-CM | POA: Diagnosis not present

## 2017-09-23 MED ORDER — PREGABALIN 75 MG PO CAPS: 75.0000 mg | ORAL_CAPSULE | Freq: Two times a day (BID) | ORAL | 0 refills | Status: DC

## 2017-09-23 NOTE — Assessment & Plan Note (Signed)
Mild elevation no changes to meds

## 2017-09-23 NOTE — Patient Instructions (Addendum)
Call Dr. Elnoria HowardHung for F/U appointment  Cancel the [place]  Try the lyrica F/U December

## 2017-09-23 NOTE — Assessment & Plan Note (Signed)
Taking fish oil recommend BID

## 2017-09-23 NOTE — Assessment & Plan Note (Signed)
Diet still an issue, weight down 10lbs but expect more Continue phentermine discussed carbs/sugars

## 2017-09-23 NOTE — Assessment & Plan Note (Signed)
Trial of lyrica, given free trial of 75mg  BID for 1 week Hold gabapentin during this time

## 2017-09-23 NOTE — Assessment & Plan Note (Addendum)
Goal < 7% , weight loss would be very helpful Continue MTF On ARB

## 2017-09-23 NOTE — Progress Notes (Signed)
   Subjective:    Patient ID: Alexandra Christensen, female    DOB: [birth year], 47 y.o.   MRN: [id number]  Patient presents for Follow-up (is fasting)  Pt here to f/u chronic medical problems    Medications reviewed   DM- last A1C 7.1% in May, when she coverted for borderline diabetic to diabetes. Was off Metformin during colitis treatment.  Highest CBG past few weeks unknown, has not been checking regulary  Recent CMET due to elevated LFT was normal on 7/24   Hyperlipidemia- TG were elevated at 258 taking fish oil at least once a day    Neuropathy feet continue to hurt at night- taking 1 gabapentin twice day    Weight loss down 10llbs in the past 3 months   [facility]   Review Of Systems:  GEN- denies fatigue, fever, weight loss,weakness, recent illness HEENT- denies eye drainage, change in vision, nasal discharge, CVS- denies chest pain, palpitations RESP- denies SOB, cough, wheeze ABD- denies N/V, change in stools, abd pain GU- denies dysuria, hematuria, dribbling, incontinence MSK- denies joint pain, muscle aches, injury Neuro- denies headache, dizziness, syncope, seizure activity       Objective:    BP 138/88   Pulse 82   Temp 98.1 F (36.7 C) (Oral)   Resp 14   Ht 5\' 7"  (1.702 m)   Wt 261 lb (118.4 kg)   LMP 06/23/2017 Comment: irregular  SpO2 96%   BMI 40.88 kg/m  GEN- NAD, alert and oriented x3 HEENT- PERRL, EOMI, non injected sclera, pink conjunctiva, MMM, oropharynx clear Neck- Supple, no thyromegaly CVS- RRR, no murmur RESP-CTAB ABD-NABS,soft,NT,ND EXT- No edema Pulses- Radial, DP- 2+        Assessment & Plan:      Problem List Items Addressed This Visit      Unprioritized   Essential hypertension    Mild elevation no changes to meds      Hypertriglyceridemia    Taking fish oil recommend BID      Relevant Orders   Lipid panel   Neuropathy - Primary    Trial of lyrica, given free trial of 75mg  BID for 1 week Hold gabapentin  during this time      Obesity    Diet still an issue, weight down 10lbs but expect more Continue phentermine discussed carbs/sugars      Type 2 diabetes mellitus with neurological complications (HCC)    Goal < 7% , weight loss would be very helpful Continue MTF On ARB      Relevant Orders   CBC with Differential/Platelet   Hemoglobin A1c   HM DIABETES FOOT EXAM (Completed)      Note: This dictation was prepared with Dragon dictation along with smaller phrase technology. Any transcriptional errors that result from this process are unintentional.

## 2017-09-24 LAB — LIPID PANEL
CHOL/HDL RATIO: 4.9 (calc) (ref <5.0)
CHOLESTEROL: 209 mg/dL — AB (ref <200)
HDL: 43 mg/dL — AB (ref >50)
NON-HDL CHOLESTEROL (CALC): 166 mg/dL — AB (ref <130)
TRIGLYCERIDES: 411 mg/dL — AB (ref <150)

## 2017-09-24 LAB — CBC WITH DIFFERENTIAL/PLATELET
Basophils Absolute: 73 cells/uL (ref 0–200)
Basophils Relative: 0.6 %
EOS PCT: 3.5 %
Eosinophils Absolute: 424 cells/uL (ref 15–500)
HEMATOCRIT: 42.2 % (ref 35.0–45.0)
HEMOGLOBIN: 14.3 g/dL (ref 11.7–15.5)
LYMPHS ABS: 3279 {cells}/uL (ref 850–3900)
MCH: 30 pg (ref 27.0–33.0)
MCHC: 33.9 g/dL (ref 32.0–36.0)
MCV: 88.7 fL (ref 80.0–100.0)
MONOS PCT: 4.4 %
MPV: 11.6 fL (ref 7.5–12.5)
Neutro Abs: 7792 cells/uL (ref 1500–7800)
Neutrophils Relative %: 64.4 %
Platelets: 337 10*3/uL (ref 140–400)
RBC: 4.76 10*6/uL (ref 3.80–5.10)
RDW: 13.8 % (ref 11.0–15.0)
Total Lymphocyte: 27.1 %
WBC mixed population: 532 cells/uL (ref 200–950)
WBC: 12.1 10*3/uL — ABNORMAL HIGH (ref 3.8–10.8)

## 2017-09-24 LAB — HEMOGLOBIN A1C
HEMOGLOBIN A1C: 7.1 %{Hb} — AB (ref <5.7)
MEAN PLASMA GLUCOSE: 157 (calc)
eAG (mmol/L): 8.7 (calc)

## 2017-09-25 ENCOUNTER — Encounter: Payer: Self-pay | Admitting: *Deleted

## 2017-09-25 ENCOUNTER — Other Ambulatory Visit: Payer: Self-pay | Admitting: *Deleted

## 2017-09-25 MED ORDER — ATORVASTATIN CALCIUM 20 MG PO TABS: 20.0000 mg | ORAL_TABLET | Freq: Every day | ORAL | 3 refills | Status: DC

## 2017-09-30 ENCOUNTER — Other Ambulatory Visit: Payer: Self-pay

## 2017-09-30 ENCOUNTER — Ambulatory Visit: Payer: BC Managed Care – PPO | Admitting: Family Medicine

## 2017-09-30 ENCOUNTER — Other Ambulatory Visit: Payer: Self-pay | Admitting: Family Medicine

## 2017-09-30 ENCOUNTER — Encounter: Payer: Self-pay | Admitting: Family Medicine

## 2017-09-30 VITALS — BP 132/90 | HR 109 | Temp 98.1°F | Resp 16 | Ht 67.0 in | Wt 254.4 lb

## 2017-09-30 DIAGNOSIS — J029 Acute pharyngitis, unspecified: Secondary | ICD-10-CM | POA: Diagnosis not present

## 2017-09-30 DIAGNOSIS — B370 Candidal stomatitis: Secondary | ICD-10-CM

## 2017-09-30 DIAGNOSIS — N760 Acute vaginitis: Secondary | ICD-10-CM | POA: Diagnosis not present

## 2017-09-30 DIAGNOSIS — N390 Urinary tract infection, site not specified: Secondary | ICD-10-CM

## 2017-09-30 DIAGNOSIS — R319 Hematuria, unspecified: Secondary | ICD-10-CM

## 2017-09-30 LAB — URINALYSIS, ROUTINE W REFLEX MICROSCOPIC
BILIRUBIN URINE: NEGATIVE
Glucose, UA: NEGATIVE
KETONES UR: NEGATIVE
NITRITE: NEGATIVE
Specific Gravity, Urine: 1.02 (ref 1.001–1.03)
pH: 5.5 (ref 5.0–8.0)

## 2017-09-30 LAB — MICROSCOPIC MESSAGE

## 2017-09-30 MED ORDER — FLUCONAZOLE 150 MG PO TABS: 150.0000 mg | ORAL_TABLET | ORAL | 0 refills | Status: DC

## 2017-09-30 MED ORDER — CEPHALEXIN 250 MG/5ML PO SUSR
500.0000 mg | Freq: Four times a day (QID) | ORAL | 0 refills | Status: AC
Start: 2017-09-30 — End: 2017-10-05

## 2017-09-30 NOTE — Patient Instructions (Addendum)
If you have any swelling going down the front of your throat/neck go to the ER.  If you have trouble speaking, opening your mouth, if your voice changes suddenly, if you have trouble breathing while laying down.  You need to go to the ER or call 911.   Peritonsillar Abscess A peritonsillar abscess is a collection of yellowish-white fluid (pus) in the back of the throat. It forms behind the tonsils. Treatment usually involves draining the fluid. This may be done by:  Putting a needle into the abscess.  Cutting and draining the abscess.  Follow these instructions at home:  Rest as much as you can.  Take medicines only as told by your doctor.  If you were given an antibiotic medicine, finish it all even if you start to feel better.  If your abscess was drained by your doctor: ? Mix 1 teaspoon of salt in 8 ounces of warm water for gargling. ? Gargle 4 times per day or as needed for comfort. ? Do not swallow this mixture.  Drink a lot of fluids.  Eat soft or liquid foods while your throat is sore. Frozen ice pops and ice cream are good choices.  Keep all follow-up visits as told by your doctor. This is important. Contact a doctor if:  You have more pain, swelling, redness, or drainage in your throat.  You have a headache, have low energy, or feel sick.  You have a fever.  You feel dizzy.  You have trouble swallowing or eating.  You have signs of body fluid loss (dehydration): ? Light-headedness when you are standing. ? Peeing (urinating) less. ? A fast heart rate. ? Dry mouth. Get help right away if:  You have trouble talking or breathing.  You find it easier to breathe when you lean forward.  You are coughing up blood or throwing up (vomiting) blood.  You have severe throat pain that is not helped by medicines.  You start to drool. This information is not intended to replace advice given to you by your health care provider. Make sure you discuss any questions you  have with your health care provider. Document Released: 01/23/2009 Document Revised: 07/13/2015 Document Reviewed: 09/20/2013 Elsevier Interactive Patient Education  2018 [employer] Inc.  Pharyngitis Pharyngitis is redness, pain, and swelling (inflammation) of the throat (pharynx). It is a very common cause of sore throat. Pharyngitis can be caused by a bacteria, but it is usually caused by a virus. Most cases of pharyngitis get better on their own without treatment. What are the causes? This condition may be caused by:  Infection by viruses (viral). Viral pharyngitis spreads from person to person (is contagious) through coughing, sneezing, and sharing of personal items or utensils such as cups, forks, spoons, and toothbrushes.  Infection by bacteria (bacterial). Bacterial pharyngitis may be spread by touching the nose or face after coming in contact with the bacteria, or through more intimate contact, such as kissing.  Allergies. Allergies can cause buildup of mucus in the throat (post-nasal drip), leading to inflammation and irritation. Allergies can also cause blocked nasal passages, forcing breathing through the mouth, which dries and irritates the throat.  What increases the risk? You are more likely to develop this condition if:  You are 335-49 years old.  You are exposed to crowded environments such as daycare, school, or dormitory living.  You live in a cold climate.  You have a weakened disease-fighting (immune) system.  What are the signs or symptoms? Symptoms of this  condition vary by the cause (viral, bacterial, or allergies) and can include:  Sore throat.  Fatigue.  Low-grade fever.  Headache.  Joint pain and muscle aches.  Skin rashes.  Swollen glands in the throat (lymph nodes).  Plaque-like film on the throat or tonsils. This is often a symptom of bacterial pharyngitis.  Vomiting.  Stuffy nose (nasal congestion).  Cough.  Red, itchy eyes  (conjunctivitis).  Loss of appetite.  How is this diagnosed? This condition is often diagnosed based on your medical history and a physical exam. Your health care provider will ask you questions about your illness and your symptoms. A swab of your throat may be done to check for bacteria (rapid strep test). Other lab tests may also be done, depending on the suspected cause, but these are rare. How is this treated? This condition usually gets better in 3-4 days without medicine. Bacterial pharyngitis may be treated with antibiotic medicines. Follow these instructions at home:  Take over-the-counter and prescription medicines only as told by your health care provider. ? If you were prescribed an antibiotic medicine, take it as told by your health care provider. Do not stop taking the antibiotic even if you start to feel better. ? Do not give children aspirin because of the association with Reye syndrome.  Drink enough water and fluids to keep your urine clear or pale yellow.  Get a lot of rest.  Gargle with a salt-water mixture 3-4 times a day or as needed. To make a salt-water mixture, completely dissolve -1 tsp of salt in 1 cup of warm water.  If your health care provider approves, you may use throat lozenges or sprays to soothe your throat. Contact a health care provider if:  You have large, tender lumps in your neck.  You have a rash.  You cough up green, yellow-brown, or bloody spit. Get help right away if:  Your neck becomes stiff.  You drool or are unable to swallow liquids.  You cannot drink or take medicines without vomiting.  You have severe pain that does not go away, even after you take medicine.  You have trouble breathing, and it is not caused by a stuffy nose.  You have new pain and swelling in your joints such as the knees, ankles, wrists, or elbows. Summary  Pharyngitis is redness, pain, and swelling (inflammation) of the throat (pharynx).  While  pharyngitis can be caused by a bacteria, the most common causes are viral.  Most cases of pharyngitis get better on their own without treatment.  Bacterial pharyngitis is treated with antibiotic medicines. This information is not intended to replace advice given to you by your health care provider. Make sure you discuss any questions you have with your health care provider. Document Released: 02/04/2005 Document Revised: 03/12/2016 Document Reviewed: 03/12/2016 Elsevier Interactive Patient Education  2018 [employer].   Urinary Tract Infection, Adult A urinary tract infection (UTI) is an infection of any part of the urinary tract. The urinary tract includes the:  Kidneys.  Ureters.  Bladder.  Urethra.  These organs make, store, and get rid of pee (urine) in the body. Follow these instructions at home:  Take over-the-counter and prescription medicines only as told by your doctor.  If you were prescribed an antibiotic medicine, take it as told by your doctor. Do not stop taking the antibiotic even if you start to feel better.  Avoid the following drinks: ? Alcohol. ? Caffeine. ? Tea. ? Carbonated drinks.  Drink enough fluid  to keep your pee clear or pale yellow.  Keep all follow-up visits as told by your doctor. This is important.  Make sure to: ? Empty your bladder often and completely. Do not to hold pee for long periods of time. ? Empty your bladder before and after sex. ? Wipe from front to back after a bowel movement if you are female. Use each tissue one time when you wipe. Contact a doctor if:  You have back pain.  You have a fever.  You feel sick to your stomach (nauseous).  You throw up (vomit).  Your symptoms do not get better after 3 days.  Your symptoms go away and then come back. Get help right away if:  You have very bad back pain.  You have very bad lower belly (abdominal) pain.  You are throwing up and cannot keep down any medicines or  water. This information is not intended to replace advice given to you by your health care provider. Make sure you discuss any questions you have with your health care provider. Document Released: 07/24/2007 Document Revised: 07/13/2015 Document Reviewed: 12/26/2014 Elsevier Interactive Patient Education  2018 [employer]  Vaginitis Vaginitis is an inflammation of the vagina. It can happen when the normal bacteria and yeast in the vagina grow too much. There are different types. Treatment will depend on the type you have. Follow these instructions at home:  Take all medicines as told by your doctor.  Keep your vagina area clean and dry. Avoid soap. Rinse the area with water.  Avoid washing and cleaning out the vagina (douching).  Do not use tampons or have sex (intercourse) until your treatment is done.  Wipe from front to back after going to the restroom.  Wear cotton underwear.  Avoid wearing underwear while you sleep until your vaginitis is gone.  Avoid tight pants. Avoid underwear or nylons without a cotton panel.  Take off wet clothing (such as a bathing suit) as soon as you can.  Use mild, unscented products. Avoid fabric softeners and scented: ? Feminine sprays. ? Laundry detergents. ? Tampons. ? Soaps or bubble baths.  Practice safe sex and use condoms. Get help right away if:  You have belly (abdominal) pain.  You have a fever or lasting symptoms for more than 2-3 days.  You have a fever and your symptoms suddenly get worse. This information is not intended to replace advice given to you by your health care provider. Make sure you discuss any questions you have with your health care provider. Document Released: 05/03/2008 Document Revised: 07/13/2015 Document Reviewed: 07/18/2011 Elsevier Interactive Patient Education  2017 [employer]

## 2017-09-30 NOTE — Progress Notes (Signed)
Patient ID: Alexandra Christensen, female    DOB: 1970-02-22, 47 y.o.   MRN: [id number]  PCP: Alycia Rossetti, MD  Chief Complaint  Patient presents with  . Abdominal Pain    started thursday   . sores in mouth  . low grade fever    started yesterday   . Dysuria    started friday   . Diarrhea    started yesterday     Subjective:   Alexandra Christensen is a 47 y.o. female, presents to clinic with CC of multiple complaints.  Pt with multiple complaints, started with dysuria and vaginal swelling and burning about 5 days ago.  She tried 2 days of monistat 3 day, but no improvement and genital irritation was so severe she could not tolerate inserting vaginal applicator of meds.   She also tried taking a few days of flagyl that she had left over from recent colitis infection.  After taking flagyl for two days she developed diarrhea.  She developed severe sore throat, swollen tonsils, difficulty taking pills and swallowing, with new patches and sores in mouth and on tongue.   She has associated  fevers, sweats, chills, generalized malaise, she feels dehydrated, loss of appetite, has been able to take some pills and swallow liquid.  Sore throat is severe, constant, worsening, with radiation of pain to ears and neck.  No asymmetry to neck, no difficulty opening or closing mouth.  No hx of oral or genital sores or lesions.  She can lie flat and breath w/o difficulty, no tripoding, tolerating secretions, no change to voice.  Tongue is dry with white patches and one painful brown lump.  No nasal sx, no coughing. Diarrhea only developed after taking old antibiotics.  She had nausea only when trying to swallow abx, and otherwize to N/V.  Mild pain with urination to suprapubic area and external genitalia when urinating.  External genitalia swollen, burning, irritated, she denies vaginal discharge.  Denies vaginal or genital sores or lesions.     Patient Active Problem List   Diagnosis Date Noted  .  Essential hypertension 12/26/2015  . Neuropathy 03/15/2015  . Smoker 11/01/2014  . Hypertriglyceridemia 04/15/2014  . Type 2 diabetes mellitus with neurological complications ([place]) 90/30/0923  . Insomnia due to stress 01/12/2014  . Frequent headaches 01/12/2014  . 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 Abdominal pain, unspecified site 06/14/2013  . Inflammatory bowel diseases (IBD)   . Obesity      Prior to Admission medications   Medication Sig Start Date End Date Taking? Authorizing Provider  ALPRAZolam Duanne Moron) 1 MG tablet TAKE ONE TABLET BY MOUTH AT BEDTIME 09/12/16  Yes Susy Frizzle, MD  atorvastatin (LIPITOR) 20 MG tablet Take 1 tablet (20 mg total) by mouth daily. 09/25/17  Yes [place], Modena Nunnery, MD  Blood Glucose Monitoring Suppl (BLOOD GLUCOSE SYSTEM PAK) KIT Dispense based on patient and insurance preference. Use to monitor FSBS 1x daily. Dx: E11.9 08/16/15  Yes [place], Modena Nunnery, MD  gabapentin (NEURONTIN) 300 MG capsule TAKE ONE CAPSULE BY MOUTH TWICE DAILY 06/25/17  Yes [place], Modena Nunnery, MD  glucose blood (ONE TOUCH ULTRA TEST) test strip USE TO CHECK FASTING BLOOD GLUCOSE ONCE DAILY 07/28/17  Yes , Modena Nunnery, MD  ibuprofen (ADVIL,MOTRIN) 800 MG tablet TAKE 1 TABLET BY MOUTH EVERY 8 HOURS AS NEEDED 04/02/17  Yes Susy Frizzle, MD  Lancet Devices MISC Dispense based on patient and insurance preference. Use to monitor FSBS 1x daily. Dx:  E11.9 08/16/15  Yes [place], Modena Nunnery, MD  Lancets MISC Dispense based on patient and insurance preference. Use to monitor FSBS 1x daily. Dx: E11.9 08/16/15  Yes [place], Modena Nunnery, MD  losartan (COZAAR) 25 MG tablet TAKE ONE TABLET BY MOUTH ONCE DAILY 03/04/17  Yes [place], Modena Nunnery, MD  metFORMIN (GLUCOPHAGE) 1000 MG tablet Take 1 tablet (1,000 mg total) by mouth 2 (two) times daily with a meal. 06/23/17  Yes [place], Modena Nunnery, MD  phentermine (ADIPEX-P) 37.5 MG tablet TAKE ONE TABLET IN THE MORNING 07/28/17  Yes [place], Modena Nunnery, MD  pregabalin  (LYRICA) 75 MG capsule Take 1 capsule (75 mg total) by mouth 2 (two) times daily. 09/23/17  Yes , Modena Nunnery, MD  cephALEXin [facility]) 250 MG/5ML suspension Take 10 mLs (500 mg total) by mouth 4 (four) times daily for 5 days. 09/30/17 10/05/17  Delsa Grana, PA-C  fluconazole (DIFLUCAN) 150 MG tablet Take 1 tablet (150 mg total) by mouth every 3 (three) days for 2 doses. 09/30/17 10/04/17  Delsa Grana, PA-C     No Known Allergies   Family History  Problem Relation Age of Onset  . Diabetes Mother   . Heart disease Mother   . Kidney disease Father   . Heart disease Father   . Stroke Sister 50  . Cancer Sister        AML        Review of Systems  Constitutional: Positive for activity change, appetite change, chills, diaphoresis, fatigue and fever.  HENT: Positive for ear pain, mouth sores and sore throat. Negative for congestion, drooling, ear discharge, facial swelling, nosebleeds, postnasal drip, rhinorrhea, sinus pressure and sinus pain.   Eyes: Negative.   Respiratory: Negative.   Cardiovascular: Negative.   Gastrointestinal: Positive for abdominal pain and diarrhea. Negative for abdominal distention, anal bleeding, blood in stool, constipation, nausea and vomiting.  Endocrine: Negative.   Genitourinary: Positive for dysuria. Negative for decreased urine volume, enuresis, flank pain, frequency, genital sores, hematuria, urgency, vaginal bleeding and vaginal discharge.  Musculoskeletal: Negative.   Skin: Negative.   Allergic/Immunologic: Negative.   Neurological: Negative.   Hematological: Positive for adenopathy ( ). Does not bruise/bleed easily.  Psychiatric/Behavioral: Negative.   All other systems reviewed and are negative.      Objective:    Vitals:   09/30/17 0907  BP: 132/90  Pulse: (!) 109  Resp: 16  Temp: 98.1 F (36.7 C)  TempSrc: Oral  SpO2: 99%  Weight: 254 lb 6.4 oz (115.4 kg)  Height: _0  (1.702 m)      Physical Exam  Constitutional: She is  oriented to person, place, and time. She appears well-developed and well-nourished.  Non-toxic appearance. No distress.  HENT:  Head: Normocephalic and atraumatic.  Right Ear: Tympanic membrane, external ear and ear canal normal.  Left Ear: Tympanic membrane, external ear and ear canal normal.  Nose: Nose normal.  Mouth/Throat: Uvula is midline. Mucous membranes are not pale, dry and not cyanotic. Oral lesions present. No trismus in the jaw. No uvula swelling. Oropharyngeal exudate, posterior oropharyngeal edema and posterior oropharyngeal erythema present.  Enlarged tonsils with dark yellow-brown exudate Matted dry tongue with white patches on edges, 1 cm irregular purulent patch to top of tongue L tonsil 3+ right 2+, uvula midline  Eyes: Pupils are equal, round, and reactive to light. Conjunctivae, EOM and lids are normal.  Neck: Trachea normal, normal range of motion, full passive range of motion without pain and phonation normal. Neck  supple. No neck rigidity. No tracheal deviation present.  Cardiovascular: Regular rhythm, normal heart sounds and normal pulses. Tachycardia present. Exam reveals no gallop and no friction rub.  No murmur heard. Pulses:      Radial pulses are 2+ on the right side, and 2+ on the left side.       Posterior tibial pulses are 2+ on the right side, and 2+ on the left side.  Pulmonary/Chest: Effort normal and breath sounds normal. No accessory muscle usage or stridor. No respiratory distress. She has no decreased breath sounds. She has no wheezes. She has no rhonchi. She has no rales. She exhibits no tenderness.  Abdominal: Soft. Normal appearance and bowel sounds are normal. She exhibits no distension. There is tenderness in the suprapubic area. There is no rigidity, no rebound, no guarding, no CVA tenderness and no tenderness at McBurney's point.  Musculoskeletal: Normal range of motion. She exhibits no edema or deformity.  Lymphadenopathy:    She has cervical  adenopathy (mild).  Neurological: She is alert and oriented to person, place, and time. She exhibits normal muscle tone. Gait normal.  Skin: Skin is warm, dry and intact. Capillary refill takes less than 2 seconds. No rash noted. She is not diaphoretic. No cyanosis. No pallor. Nails show no clubbing.  Psychiatric: She has a normal mood and affect. Her speech is normal and behavior is normal.  Nursing note and vitals reviewed.         Assessment & Plan:      ICD-10-CM   1. Urinary tract infection with hematuria, site unspecified N39.0 Urinalysis, Routine w reflex microscopic   R31.9 Urine Culture    CBC with Differential/Platelet    COMPLETE METABOLIC PANEL WITH GFR    cephALEXin (KEFLEX) 250 MG/5ML suspension  2. Acute vaginitis N76.0 WET PREP FOR TRICH, YEAST, CLUE    fluconazole (DIFLUCAN) 150 MG tablet  3. Pharyngitis, unspecified etiology J02.9 Anaerobic and Aerobic Culture    CBC with Differential/Platelet    COMPLETE METABOLIC PANEL WITH GFR    cephALEXin (KEFLEX) 250 MG/5ML suspension  4. Oral thrush B37.0 fluconazole (DIFLUCAN) 150 MG tablet    Pt with multiple complaints, started with dysuria and vaginal swelling and burning.  She tried 2 days of monistat 3 day, but no improvement and genital irritation was so severe she could not tolerate inserting vaginal applicator of meds.   She also tried taking a few days of flagyl that she had left over from recent colitis infection.  She developed severe sore throat and has fevers, sweats, chills, and now diarrhea.    Concern for pharyngitis with left tonsil larger than right, uvula midline, will watch closely for improvement, recheck in a few days, reviewed at length with pt concerning signs/sx for worsening infection and ER precautions - she verbalized understanding.  She has been able to tolerate fluids and taking pills.  Encouraged to push fluids, take tylenol/ibuprofen liquid scheduled to help with pain and fever.  Abx prescribed as  liquid to help with compliance.  Recheck in 3 days  UTI - bacterial vs fungal secondary to yeast vaginitis?  Treating both given severity of vaginitis sx.  Pt refused any genital exam or testing - wet prep was cancelled.  She was mildly tachy, suspect from infection, pain and mild dehydration.  Because tolerating PO's did not give IV fluids.    Keflex chosen to cover throat microbes and UTI.  Attempted to obtain rapid strep and additional general culture.  Rapid strep  negative - aerobic and anaerobic culture from throat ordered.  Very exudative erythematous and edematous posterior oropharynx.    If no improvement in a few days ENT referral will be initiated.  Currently airway intact, no stridor, tripoding, trismus, no concern for ludwigs angina.  Feel she is stable for outpt tx with abx.  Again, ER precautions reviewed, printed out for pt, and she verbalized understanding.   Delsa Grana, PA-C 09/30/17 9:52 AM

## 2017-09-30 NOTE — Telephone Encounter (Signed)
Ok to refill??  Last office visit 09/23/2017.  Last refill was 7 day trial. Patient states that medication is effective.

## 2017-10-01 LAB — URINE CULTURE
MICRO NUMBER: 90959083
SPECIMEN QUALITY: ADEQUATE

## 2017-10-01 LAB — CBC WITH DIFFERENTIAL/PLATELET
BASOS ABS: 47 {cells}/uL (ref 0–200)
Basophils Relative: 0.4 %
EOS ABS: 351 {cells}/uL (ref 15–500)
Eosinophils Relative: 3 %
HCT: 42 % (ref 35.0–45.0)
Hemoglobin: 14.8 g/dL (ref 11.7–15.5)
Lymphs Abs: 1451 cells/uL (ref 850–3900)
MCH: 30.6 pg (ref 27.0–33.0)
MCHC: 35.2 g/dL (ref 32.0–36.0)
MCV: 86.8 fL (ref 80.0–100.0)
MPV: 12.2 fL (ref 7.5–12.5)
Monocytes Relative: 5.9 %
NEUTROS PCT: 78.3 %
Neutro Abs: 9161 cells/uL — ABNORMAL HIGH (ref 1500–7800)
PLATELETS: 235 10*3/uL (ref 140–400)
RBC: 4.84 10*6/uL (ref 3.80–5.10)
RDW: 13.6 % (ref 11.0–15.0)
TOTAL LYMPHOCYTE: 12.4 %
WBC: 11.7 10*3/uL — ABNORMAL HIGH (ref 3.8–10.8)
WBCMIX: 690 {cells}/uL (ref 200–950)

## 2017-10-01 LAB — COMPLETE METABOLIC PANEL WITH GFR
AG Ratio: 1.4 (calc) (ref 1.0–2.5)
ALBUMIN MSPROF: 4 g/dL (ref 3.6–5.1)
ALKALINE PHOSPHATASE (APISO): 91 U/L (ref 33–115)
ALT: 28 U/L (ref 6–29)
AST: 26 U/L (ref 10–35)
BUN: 9 mg/dL (ref 7–25)
CO2: 23 mmol/L (ref 20–32)
CREATININE: 0.83 mg/dL (ref 0.50–1.10)
Calcium: 9.3 mg/dL (ref 8.6–10.2)
Chloride: 97 mmol/L — ABNORMAL LOW (ref 98–110)
GFR, Est African American: 96 mL/min/{1.73_m2} (ref >=60)
GFR, Est Non African American: 83 mL/min/{1.73_m2} (ref >=60)
GLUCOSE: 154 mg/dL — AB (ref 65–99)
Globulin: 2.8 g/dL (calc) (ref 1.9–3.7)
Potassium: 3.8 mmol/L (ref 3.5–5.3)
Sodium: 134 mmol/L — ABNORMAL LOW (ref 135–146)
TOTAL PROTEIN: 6.8 g/dL (ref 6.1–8.1)
Total Bilirubin: 0.4 mg/dL (ref 0.2–1.2)

## 2017-10-02 LAB — CULTURE, GROUP A STREP
MICRO NUMBER: 90958716
SPECIMEN QUALITY:: ADEQUATE

## 2017-10-02 LAB — STREP GROUP A AG, W/REFLEX TO CULT: Streptococcus, Group A Screen (Direct): NOT DETECTED

## 2017-10-03 ENCOUNTER — Encounter

## 2017-10-03 ENCOUNTER — Ambulatory Visit (HOSPITAL_COMMUNITY)
Admission: RE | Admit: 2017-10-03 | Discharge: 2017-10-03 | Disposition: A | Discharge status: Home / Self Care | Payer: BC Managed Care – PPO | Source: Ambulatory Visit | Attending: Family Medicine | Admitting: Family Medicine

## 2017-10-03 ENCOUNTER — Encounter: Payer: Self-pay | Admitting: Family Medicine

## 2017-10-03 ENCOUNTER — Telehealth: Payer: Self-pay | Admitting: Family Medicine

## 2017-10-03 ENCOUNTER — Encounter (HOSPITAL_COMMUNITY): Payer: Self-pay

## 2017-10-03 ENCOUNTER — Ambulatory Visit: Payer: BC Managed Care – PPO | Admitting: Gastroenterology

## 2017-10-03 ENCOUNTER — Ambulatory Visit: Payer: BC Managed Care – PPO | Admitting: Family Medicine

## 2017-10-03 VITALS — BP 144/94 | HR 105 | Temp 97.7°F | Resp 18 | Ht 67.0 in | Wt 251.0 lb

## 2017-10-03 DIAGNOSIS — B370 Candidal stomatitis: Secondary | ICD-10-CM | POA: Diagnosis not present

## 2017-10-03 DIAGNOSIS — J038 Acute tonsillitis due to other specified organisms: Secondary | ICD-10-CM

## 2017-10-03 DIAGNOSIS — R590 Localized enlarged lymph nodes: Secondary | ICD-10-CM | POA: Diagnosis not present

## 2017-10-03 DIAGNOSIS — J36 Peritonsillar abscess: Secondary | ICD-10-CM | POA: Insufficient documentation

## 2017-10-03 DIAGNOSIS — B9689 Other specified bacterial agents as the cause of diseases classified elsewhere: Secondary | ICD-10-CM

## 2017-10-03 MED ORDER — FIRST-DUKES MOUTHWASH MT SUSP: OROMUCOSAL | 0 refills | Status: DC

## 2017-10-03 MED ORDER — FLUCONAZOLE 150 MG PO TABS: ORAL_TABLET | ORAL | 0 refills | Status: DC

## 2017-10-03 MED ORDER — IOHEXOL 300 MG/ML  SOLN
75.0000 mL | Freq: Once | INTRAMUSCULAR | Status: AC | PRN
  Administered 2017-10-03: 75 mL via INTRAVENOUS

## 2017-10-03 MED ORDER — CLINDAMYCIN HCL 300 MG PO CAPS: 300.0000 mg | ORAL_CAPSULE | Freq: Three times a day (TID) | ORAL | 0 refills | Status: DC

## 2017-10-03 NOTE — Progress Notes (Signed)
   Subjective:    Patient ID: Alexandra Christensen, femaHarlan Stainsle    DOB: 02-Aug-1970, 47 y.o.   MRN: [id number]  Patient presents for Follow-up (SORE TROAT )  Pt seen for UTI symptoms and sore throat. Treated with keflex, though urine culture did return neg. Her sore throat persisted rapid strep was neg but culture came back positive. She was also given diflucan for yeast/thrush to cover both vaginal and oral. No recent fever, no cough but severe sore throat and now has swelling of tonsils significantly and large swollen nodes on left side of neck. She has pain with swallowing but not difficulty breathing She has used peroxide rinses in throat and ears    UTI symptoms have resolved  Review Of Systems: per above  GEN- denies fatigue, fever, weight loss,weakness, recent illness HEENT- denies eye drainage, change in vision, nasal discharge, CVS- denies chest pain, palpitations RESP- denies SOB, cough, wheeze ABD- denies N/V, change in stools, abd pain GU- denies dysuria, hematuria, dribbling, incontinence MSK- denies joint pain, muscle aches, injury Neuro- denies headache, dizziness, syncope, seizure activity       Objective:    BP (!) 144/94   Pulse (!) 105   Temp 97.7 F (36.5 C) (Oral)   Resp 18   Ht 5\' 7"  (1.702 m)   Wt 251 lb (113.9 kg)   SpO2 96%   BMI 39.31 kg/m  GEN- NAD, alert and oriented x3 HEENT- PERRL, EOMI, non injected sclera, pink conjunctiva, MMM, oropharynx thrush and tonsilar exudate bilat, very large left tonsil- significantt asymetry, TM clear bilat no effusion  Neck- Supple, large swollen node left ant cervial region tracking down neck CVS- mild tachycardia, no murmur RESP-CTAB ABD-NABS,soft,NT,ND EXT- No edema Pulses- Radial, DP- 2+        Assessment & Plan:      Problem List Items Addressed This Visit    None    Visit Diagnoses    Acute bacterial tonsillitis    -  Primary   Urgent CT done, no sign of abscess, isolated lymph node and no difficulty  breathing will change to clindamycin as keflex not working   Relevant Medications   clindamycin (CLEOCIN) 300 MG capsule   fluconazole (DIFLUCAN) 150 MG tablet   Diphenhyd-Hydrocort-Nystatin (FIRST-DUKES MOUTHWASH) SUSP   Other Relevant Orders   CT Soft Tissue Neck W Contrast (Completed)   Thrush       Diflucan for yeast and magic mouthwash given, recheck Tuesday. Given red flags    Relevant Medications   clindamycin (CLEOCIN) 300 MG capsule   fluconazole (DIFLUCAN) 150 MG tablet   Diphenhyd-Hydrocort-Nystatin (FIRST-DUKES MOUTHWASH) SUSP   Cervical lymphadenopathy          Note: This dictation was prepared with Dragon dictation along with smaller phrase technology. Any transcriptional errors that result from this process are unintentional.

## 2017-10-03 NOTE — Patient Instructions (Addendum)
CT of neck to be done  F/U Next Tuesday

## 2017-10-03 NOTE — Telephone Encounter (Signed)
Spoke with patient and informed her of CT results and medications to be called in. Patient also to follow up with Dr. Jeanice Limurham on 10/07/17. Patient would like to know if she can have a work note. States she has been out of work since 09/30/17. Please advise?

## 2017-10-03 NOTE — Telephone Encounter (Signed)
Left message return call.Letter at front desk for patient to pick up.

## 2017-10-03 NOTE — Telephone Encounter (Signed)
Yes please write note for work until she returns to see my Tuesday

## 2017-10-05 ENCOUNTER — Encounter: Payer: Self-pay | Admitting: Family Medicine

## 2017-10-06 LAB — ANAEROBIC AND AEROBIC CULTURE
MICRO NUMBER: 90959068
MICRO NUMBER: 90959069
SPECIMEN QUALITY: ADEQUATE
SPECIMEN QUALITY:: ADEQUATE

## 2017-10-07 ENCOUNTER — Other Ambulatory Visit: Payer: Self-pay

## 2017-10-07 ENCOUNTER — Encounter: Payer: Self-pay | Admitting: Family Medicine

## 2017-10-07 ENCOUNTER — Ambulatory Visit: Payer: BC Managed Care – PPO | Admitting: Family Medicine

## 2017-10-07 VITALS — BP 140/82 | HR 86 | Temp 98.2°F | Resp 14 | Ht 67.0 in | Wt 248.0 lb

## 2017-10-07 DIAGNOSIS — R220 Localized swelling, mass and lump, head: Secondary | ICD-10-CM | POA: Diagnosis not present

## 2017-10-07 DIAGNOSIS — J358 Other chronic diseases of tonsils and adenoids: Secondary | ICD-10-CM

## 2017-10-07 DIAGNOSIS — B9689 Other specified bacterial agents as the cause of diseases classified elsewhere: Secondary | ICD-10-CM | POA: Insufficient documentation

## 2017-10-07 DIAGNOSIS — J038 Acute tonsillitis due to other specified organisms: Secondary | ICD-10-CM | POA: Diagnosis not present

## 2017-10-07 NOTE — Progress Notes (Addendum)
   Subjective:    Patient ID: Alexandra Christensen, female    DOB: Jan 21, 1971, 47 y.o.   MRN: [id number]  Patient presents for Follow-up (tonsillitis)  She here for interim follow-up on her bacterial tonsillitis she had strep B come back and her culture anaerobes were negative.  She has CT scan done urgently last Friday is concern for asymmetrical tonsil along with very large left-sided lymph node concern for abscess.  Was not found to have any abscess but isolated lymphadenopathy reactive to the infection.  She has not had any fever no difficulty swallowing.  I changed her from Keflex if there is no improvement to clindamycin for better coverage. Also had thrush.  She was given Diflucan tablets along with [employer] Magic mouthwash. She is also given a work note to cover the time she had been out secondary to her infection, supposed to return to work tomorrow  She has not been able to eat very well- weight down 10lbs since July  Has been afraid to try some foods, had grits, banana yesterday  SHe does feel better, neck /ear pain has resolved, does not feel like she is choking when she does eat now or take antibiotics   Review Of Systems:  GEN- denies fatigue, fever, weight loss,weakness, recent illness HEENT- denies eye drainage, change in vision, nasal discharge, CVS- denies chest pain, palpitations RESP- denies SOB, cough, wheeze ABD- denies N/V, change in stools, abd pain Neuro- denies headache, dizziness, syncope, seizure activity       Objective:    BP 140/82   Pulse 86   Temp 98.2 F (36.8 C) (Oral)   Resp 14   Ht 5\' 7"  (1.702 m)   Wt 248 lb (112.5 kg)   SpO2 99%   BMI 38.84 kg/m  GEN- NAD, alert and oriented x3 HEENT- PERRL, EOMI, non injected sclera, pink conjunctiva, MMM, oropharynx improved thrush ,exudate left tonsil,  very large left tonsil- significantt asymetry with white cystic material dangling, TM clear bilat no effusion  Neck- Supple,decreased left ant cervical  LAD Pulses- Radial  2+      Assessment & Plan:    CT showed infection with possible tumor/cyst   - she is improving, infection is clearing but she has a thick cystic like material that is breaking off, concern she has a solid component beneath infection Will send to ENT to be seen today to look at this. Continue clindamycin, magic mouthwash   During exam I used first tongue depression then a nasal currette, I was able to grab a piece of the solid mass and placed in specimen jar to send with patient to ENT        Problem List Items Addressed This Visit    None      Note: This dictation was prepared with Dragon dictation along with smaller phrase technology. Any transcriptional errors that result from this process are unintentional.

## 2017-10-07 NOTE — Patient Instructions (Signed)
F/u PENDING ENT

## 2018-01-08 ENCOUNTER — Other Ambulatory Visit: Payer: Self-pay | Admitting: Family Medicine

## 2018-01-08 NOTE — Telephone Encounter (Signed)
Ok to refill??  Last office visit 10/07/2017.  Last refill 07/28/2017.

## 2018-01-09 NOTE — Telephone Encounter (Signed)
Needs OV.  

## 2018-01-13 ENCOUNTER — Encounter: Payer: Self-pay | Admitting: *Deleted

## 2018-01-13 NOTE — Telephone Encounter (Signed)
Letter sent.

## 2018-01-18 ENCOUNTER — Emergency Department (HOSPITAL_COMMUNITY): Payer: BC Managed Care – PPO

## 2018-01-18 ENCOUNTER — Encounter (HOSPITAL_COMMUNITY): Payer: Self-pay | Admitting: Emergency Medicine

## 2018-01-18 ENCOUNTER — Emergency Department (HOSPITAL_COMMUNITY)
Admission: EM | Admit: 2018-01-18 | Discharge: 2018-01-18 | Disposition: A | Discharge status: Home / Self Care | Payer: BC Managed Care – PPO | Attending: Emergency Medicine | Admitting: Emergency Medicine

## 2018-01-18 DIAGNOSIS — R1084 Generalized abdominal pain: Secondary | ICD-10-CM

## 2018-01-18 DIAGNOSIS — F17210 Nicotine dependence, cigarettes, uncomplicated: Secondary | ICD-10-CM | POA: Diagnosis not present

## 2018-01-18 DIAGNOSIS — Z79899 Other long term (current) drug therapy: Secondary | ICD-10-CM | POA: Diagnosis not present

## 2018-01-18 DIAGNOSIS — E119 Type 2 diabetes mellitus without complications: Secondary | ICD-10-CM | POA: Insufficient documentation

## 2018-01-18 DIAGNOSIS — I10 Essential (primary) hypertension: Secondary | ICD-10-CM | POA: Insufficient documentation

## 2018-01-18 DIAGNOSIS — R109 Unspecified abdominal pain: Secondary | ICD-10-CM | POA: Diagnosis present

## 2018-01-18 LAB — URINALYSIS, ROUTINE W REFLEX MICROSCOPIC
Bilirubin Urine: NEGATIVE
Glucose, UA: NEGATIVE mg/dL
HGB URINE DIPSTICK: NEGATIVE
Ketones, ur: NEGATIVE mg/dL
LEUKOCYTES UA: NEGATIVE
NITRITE: NEGATIVE
PROTEIN: NEGATIVE mg/dL
Specific Gravity, Urine: 1.045 — ABNORMAL HIGH (ref 1.005–1.030)
pH: 5 (ref 5.0–8.0)

## 2018-01-18 LAB — COMPREHENSIVE METABOLIC PANEL
ALT: 19 U/L (ref 0–44)
AST: 17 U/L (ref 15–41)
Albumin: 3.6 g/dL (ref 3.5–5.0)
Alkaline Phosphatase: 85 U/L (ref 38–126)
Anion gap: 9 (ref 5–15)
BUN: 14 mg/dL (ref 6–20)
CHLORIDE: 102 mmol/L (ref 98–111)
CO2: 23 mmol/L (ref 22–32)
CREATININE: 0.83 mg/dL (ref 0.44–1.00)
Calcium: 9 mg/dL (ref 8.9–10.3)
Glucose, Bld: 141 mg/dL — ABNORMAL HIGH (ref 70–99)
POTASSIUM: 4.2 mmol/L (ref 3.5–5.1)
SODIUM: 134 mmol/L — AB (ref 135–145)
Total Bilirubin: 0.5 mg/dL (ref 0.3–1.2)
Total Protein: 7.3 g/dL (ref 6.5–8.1)

## 2018-01-18 LAB — CBC WITH DIFFERENTIAL/PLATELET
ABS IMMATURE GRANULOCYTES: 0.06 10*3/uL (ref 0.00–0.07)
BASOS PCT: 1 %
Basophils Absolute: 0.1 10*3/uL (ref 0.0–0.1)
EOS ABS: 0.5 10*3/uL (ref 0.0–0.5)
Eosinophils Relative: 3 %
HCT: 42.7 % (ref 36.0–46.0)
Hemoglobin: 13.7 g/dL (ref 12.0–15.0)
IMMATURE GRANULOCYTES: 0 %
Lymphocytes Relative: 18 %
Lymphs Abs: 2.7 10*3/uL (ref 0.7–4.0)
MCH: 29 pg (ref 26.0–34.0)
MCHC: 32.1 g/dL (ref 30.0–36.0)
MCV: 90.5 fL (ref 80.0–100.0)
Monocytes Absolute: 0.6 10*3/uL (ref 0.1–1.0)
Monocytes Relative: 4 %
NEUTROS ABS: 11.4 10*3/uL — AB (ref 1.7–7.7)
NEUTROS PCT: 74 %
PLATELETS: 299 10*3/uL (ref 150–400)
RBC: 4.72 MIL/uL (ref 3.87–5.11)
RDW: 13.5 % (ref 11.5–15.5)
WBC: 15.4 10*3/uL — AB (ref 4.0–10.5)
nRBC: 0 % (ref 0.0–0.2)

## 2018-01-18 LAB — I-STAT BETA HCG BLOOD, ED (MC, WL, AP ONLY)

## 2018-01-18 LAB — LIPASE, BLOOD: LIPASE: 26 U/L (ref 11–51)

## 2018-01-18 MED ORDER — ONDANSETRON HCL 4 MG/2ML IJ SOLN
4.0000 mg | Freq: Once | INTRAMUSCULAR | Status: AC
  Administered 2018-01-18: 4 mg via INTRAVENOUS
  Filled 2018-01-18: qty 2

## 2018-01-18 MED ORDER — SODIUM CHLORIDE 0.9 % IV BOLUS
1000.0000 mL | Freq: Once | INTRAVENOUS | Status: AC
  Administered 2018-01-18: 1000 mL via INTRAVENOUS

## 2018-01-18 MED ORDER — MORPHINE SULFATE (PF) 4 MG/ML IV SOLN
4.0000 mg | Freq: Once | INTRAVENOUS | Status: AC
  Administered 2018-01-18: 4 mg via INTRAVENOUS
  Filled 2018-01-18: qty 1

## 2018-01-18 MED ORDER — IOPAMIDOL (ISOVUE-300) INJECTION 61%
100.0000 mL | Freq: Once | INTRAVENOUS | Status: AC | PRN
  Administered 2018-01-18: 100 mL via INTRAVENOUS

## 2018-01-18 NOTE — Discharge Instructions (Addendum)
You have been diagnosed today with abdominal pain.  At this time there does not appear to be the presence of an emergent medical condition, however there is always the potential for conditions to change. Please read and follow the below instructions.  Please return to the Emergency Department immediately for any new or worsening symptoms or if your symptoms do not improve in 48 hours. Please be sure to follow up with your Primary Care Provider next week regarding your visit today; please call their office to schedule an appointment even if you are feeling better for a follow-up visit. Please be sure to drink plenty water and get plenty of rest over the next few days to help with your symptoms.  Get help right away if: Your pain does not go away as soon as your health care provider told you to expect. You cannot stop throwing up. Your pain is only in areas of the abdomen, such as the right side or the left lower portion of the abdomen. You have bloody or black stools, or stools that look like tar. You have severe pain, cramping, or bloating in your abdomen. You have signs of dehydration, such as: Dark urine, very little urine, or no urine. Cracked lips. Dry mouth. Sunken eyes. Sleepiness. Weakness.  Please read the additional information packets attached to your discharge summary.  Do not take your medicine if  develop an itchy rash, swelling in your mouth or lips, or difficulty breathing.

## 2018-01-18 NOTE — ED Provider Notes (Signed)
[facility] EMERGENCY DEPARTMENT Provider Note   CSN: [id number] Arrival date & time: 01/18/18  0800     History   Chief Complaint Chief Complaint  Patient presents with  . Abdominal Pain    HPI Alexandra Christensen is a 47 y.o. female presented today for 4 days of abdominal pain.  Patient states that her pain has gradually worsened since time of onset, dull/cramping, moderate in intensity, constant and worsened with palpation of the abdomen.  Patient states that the pain has gradually moved from her periumbilical region to her right lower quadrant.  Patient also endorses nonbloody diarrhea that began this morning, 3-4 episodes since this morning.  Patient has been using ibuprofen/Tylenol/Pepto-Bismol without relief.  Patient endorses nausea without vomiting.  She denies similar pain in the past.  HPI  Past Medical History:  Diagnosis Date  . Diabetes mellitus without complication ([place])   . Hyperlipidemia   . Hypertension     Patient Active Problem List   Diagnosis Date Noted  . Essential hypertension 12/26/2015  . Neuropathy 03/15/2015  . Smoker 11/01/2014  . Hypertriglyceridemia 04/15/2014  . Type 2 diabetes mellitus with neurological complications ([place]) 38/45/3646  . Insomnia due to stress 01/12/2014  . Frequent headaches 01/12/2014  . 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 Abdominal pain, unspecified site 06/14/2013  . Inflammatory bowel diseases (IBD)   . Obesity     Past Surgical History:  Procedure Laterality Date  . TEE WITHOUT CARDIOVERSION N/A 06/16/2013   Procedure: TRANSESOPHAGEAL ECHOCARDIOGRAM (TEE);  Surgeon: Pixie Casino, MD;  Location: [facility] ENDOSCOPY;  Service: Cardiovascular;  Laterality: N/A;     OB History   None      Home Medications    Prior to Admission medications   Medication Sig Start Date End Date Taking? Authorizing Provider  ALPRAZolam Duanne Moron) 1 MG tablet TAKE ONE TABLET BY MOUTH AT BEDTIME 09/12/16   Susy Frizzle, MD  atorvastatin  (LIPITOR) 20 MG tablet Take 1 tablet (20 mg total) by mouth daily. 09/25/17   Alycia Rossetti, MD  Blood Glucose Monitoring Suppl (BLOOD GLUCOSE SYSTEM PAK) KIT Dispense based on patient and insurance preference. Use to monitor FSBS 1x daily. Dx: E11.9 08/16/15   Alycia Rossetti, MD  clindamycin (CLEOCIN) 300 MG capsule Take 1 capsule (300 mg total) by mouth 3 (three) times daily. 10/03/17   [place], Modena Nunnery, MD  Diphenhyd-Hydrocort-Nystatin (FIRST-DUKES MOUTHWASH) SUSP Swish and swallow 1 teaspoon  three times a day for 2  weeks 10/03/17   Alycia Rossetti, MD  fluconazole (DIFLUCAN) 150 MG tablet Take 1 tablet and repeat in 3 days 10/03/17   Alycia Rossetti, MD  gabapentin (NEURONTIN) 300 MG capsule TAKE ONE CAPSULE BY MOUTH TWICE DAILY 06/25/17   Alycia Rossetti, MD  glucose blood (ONE TOUCH ULTRA TEST) test strip USE TO CHECK FASTING BLOOD GLUCOSE ONCE DAILY 07/28/17   Alycia Rossetti, MD  ibuprofen (ADVIL,MOTRIN) 800 MG tablet TAKE 1 TABLET BY MOUTH EVERY 8 HOURS AS NEEDED 04/02/17   Susy Frizzle, MD  Lancet Devices MISC Dispense based on patient and insurance preference. Use to monitor FSBS 1x daily. Dx: E11.9 08/16/15   Alycia Rossetti, MD  Lancets MISC Dispense based on patient and insurance preference. Use to monitor FSBS 1x daily. Dx: E11.9 08/16/15   Alycia Rossetti, MD  losartan (COZAAR) 25 MG tablet TAKE ONE TABLET BY MOUTH ONCE DAILY 03/04/17   Alycia Rossetti, MD  metFORMIN (GLUCOPHAGE) 1000 MG tablet  Take 1 tablet (1,000 mg total) by mouth 2 (two) times daily with a meal. 06/23/17   [place], Modena Nunnery, MD  phentermine (ADIPEX-P) 37.5 MG tablet TAKE ONE TABLET IN THE MORNING 07/28/17   Alycia Rossetti, MD  phentermine (ADIPEX-P) 37.5 MG tablet TAKE 1 TABLET BY MOUTH IN THE MORNING 01/09/18   , Modena Nunnery, MD  pregabalin (LYRICA) 75 MG capsule TAKE 1 CAPSULE BY MOUTH TWICE DAILY 09/30/17   Alycia Rossetti, MD    Family History Family History  Problem Relation Age of  Onset  . Diabetes Mother   . Heart disease Mother   . Kidney disease Father   . Heart disease Father   . Stroke Sister 50  . Cancer Sister        AML    Social History Social History   Tobacco Use  . Smoking status: Current Every Day Smoker    Packs/day: 0.50    Types: Cigarettes    Last attempt to quit: 03/21/2017    Years since quitting: 0.8  . Smokeless tobacco: Never Used  Substance Use Topics  . Alcohol use: Yes    Alcohol/week: 1.0 - 2.0 standard drinks    Types: 1 - 2 Glasses of wine per week    Comment: occasionally  . Drug use: No     Allergies   Patient has no known allergies.   Review of Systems Review of Systems  Constitutional: Negative.  Negative for chills and fever.  HENT: Negative.  Negative for rhinorrhea and sore throat.   Eyes: Negative.  Negative for visual disturbance.  Respiratory: Negative.  Negative for cough and shortness of breath.   Cardiovascular: Negative.  Negative for chest pain.  Gastrointestinal: Positive for abdominal pain, diarrhea and nausea. Negative for blood in stool and vomiting.  Genitourinary: Negative.  Negative for dysuria and hematuria.  Musculoskeletal: Negative.  Negative for arthralgias and myalgias.  Skin: Negative.  Negative for rash.  Neurological: Negative.  Negative for dizziness, weakness and headaches.   Physical Exam Updated Vital Signs BP (!) 152/85 (BP Location: Left Arm)   Pulse 81   Temp 98.3 F (36.8 C) (Oral)   Resp 15   Ht 5' 6"  (1.676 m)   Wt 113.4 kg   SpO2 94%   BMI 40.35 kg/m   Physical Exam  Constitutional: She appears well-developed and well-nourished. She does not appear ill. No distress.  HENT:  Head: Normocephalic and atraumatic.  Right Ear: External ear normal.  Left Ear: External ear normal.  Nose: Nose normal.  Mouth/Throat: Uvula is midline, oropharynx is clear and moist and mucous membranes are normal.  Eyes: Pupils are equal, round, and reactive to light. Conjunctivae and  EOM are normal.  Neck: Trachea normal, normal range of motion, full passive range of motion without pain and phonation normal. Neck supple. No tracheal deviation present.  Cardiovascular: Normal rate, regular rhythm and normal heart sounds.  Pulses:      Dorsalis pedis pulses are 2+ on the right side, and 2+ on the left side.       Posterior tibial pulses are 2+ on the right side, and 2+ on the left side.  Pulmonary/Chest: Effort normal and breath sounds normal. No respiratory distress. She has no decreased breath sounds. She has no rhonchi. She exhibits no tenderness, no crepitus and no deformity.  Abdominal: Soft. There is tenderness in the right lower quadrant and periumbilical area. There is tenderness at McBurney's point. There is no rigidity, no  rebound, no guarding, no CVA tenderness and negative Murphy's sign.  Obese abdomen  Musculoskeletal: Normal range of motion.       Right lower leg: Normal.       Left lower leg: Normal.  Feet:  Right Foot:  Protective Sensation: 3 sites tested. 3 sites sensed.  Left Foot:  Protective Sensation: 3 sites tested. 3 sites sensed.  Neurological: She is alert. GCS eye subscore is 4. GCS verbal subscore is 5. GCS motor subscore is 6.  Speech is clear and goal oriented, follows commands Major Cranial nerves without deficit, no facial droop Normal strength in upper and lower extremities bilaterally including dorsiflexion and plantar flexion, strong and equal grip strength Sensation normal to light touch Moves extremities without ataxia, coordination intact Normal gait  Skin: Skin is warm and dry.  Psychiatric: She has a normal mood and affect. Her behavior is normal.   ED Treatments / Results  Labs (all labs ordered are listed, but only abnormal results are displayed) Labs Reviewed  CBC WITH DIFFERENTIAL/PLATELET - Abnormal; Notable for the following components:      Result Value   WBC 15.4 (*)    Neutro Abs 11.4 (*)    All other components  within normal limits  COMPREHENSIVE METABOLIC PANEL - Abnormal; Notable for the following components:   Sodium 134 (*)    Glucose, Bld 141 (*)    All other components within normal limits  URINALYSIS, ROUTINE W REFLEX MICROSCOPIC - Abnormal; Notable for the following components:   Color, Urine STRAW (*)    Specific Gravity, Urine 1.045 (*)    All other components within normal limits  LIPASE, BLOOD  I-STAT BETA HCG BLOOD, ED ([facility], [facility], [facility] ONLY)    EKG None  Radiology Ct Abdomen Pelvis W Contrast  Result Date: 01/18/2018 CLINICAL DATA:  Worsening right-sided abdominal pain. EXAM: CT ABDOMEN AND PELVIS WITH CONTRAST TECHNIQUE: Multidetector CT imaging of the abdomen and pelvis was performed using the standard protocol following bolus administration of intravenous contrast. CONTRAST:  168m ISOVUE-300 IOPAMIDOL (ISOVUE-300) INJECTION 61% COMPARISON:  Body CT 01/21/2014 FINDINGS: Lower chest: No acute abnormality. Hepatobiliary: No focal liver abnormality is seen. No gallstones, gallbladder wall thickening, or biliary dilatation. Pancreas: Unremarkable. No pancreatic ductal dilatation or surrounding inflammatory changes. Spleen: Normal in size without focal abnormality. Again seen scarring from previously described splenic infarcts. Adrenals/Urinary Tract: Adrenal glands are unremarkable. Kidneys are normal, without renal calculi, focal lesion, or hydronephrosis. Bladder is unremarkable. Stomach/Bowel: Stomach is within normal limits. Appendix appears normal. No evidence of bowel wall thickening, distention, or inflammatory changes. Vascular/Lymphatic: Aortic atherosclerosis. No enlarged abdominal or pelvic lymph nodes. Reproductive: Uterus and bilateral adnexa are unremarkable. Other: No abdominal wall hernia or abnormality. No abdominopelvic ascites. Musculoskeletal: No acute or significant osseous findings. IMPRESSION: No acute abnormalities within the abdomen or pelvis. Mild age advanced calcific  atherosclerotic disease of the aorta. Electronically Signed   By: DFidela SalisburyM.D.   On: 01/18/2018 11:35    Procedures Procedures (including critical care time)  Medications Ordered in ED Medications  sodium chloride 0.9 % bolus 1,000 mL (0 mLs Intravenous Stopped 01/18/18 1024)  ondansetron (ZOFRAN) injection 4 mg (4 mg Intravenous Given 01/18/18 0916)  morphine 4 MG/ML injection 4 mg (4 mg Intravenous Given 01/18/18 0916)  iopamidol (ISOVUE-300) 61 % injection 100 mL (100 mLs Intravenous Contrast Given 01/18/18 1034)     Initial Impression / Assessment and Plan / ED Course  I have reviewed the triage vital signs  and the nursing notes.  Pertinent labs & imaging results that were available during my care of the patient were reviewed by me and considered in my medical decision making (see chart for details).    47 year old female presenting for 3-4 days of abdominal pain and diarrhea that began this morning.  CBC with leukocytosis CMP nonacute Lipase within normal limits Beta hCG negative Urinalysis nonacute CT abdomen pelvis without acute abnormality.  Patient informed of incidental finding of atherosclerosis and to discuss this with her primary care provider.  Patient is afebrile, nontoxic-appearing, resting comfortably and in no acute distress.  All symptoms have been adequately managed in the emergency department.  Work-up today reviewed with patient.  On repeat examination patient does not have a surgical abdomen and there are no peritoneal signs.  Patient has been encouraged to drink plenty of water and get plenty of rest over the next few days to help with her symptoms.  At this time there does not appear to be any evidence of an acute emergency medical condition and the patient appears stable for discharge with appropriate outpatient follow up. Diagnosis was discussed with patient who verbalizes understanding of care plan and is agreeable to discharge. I have discussed  return precautions with patient who verbalizes understanding of return precautions. Patient strongly encouraged to follow-up with their PCP this week. All questions answered.  Patient's case discussed with Dr. Eulis Foster who agrees with plan to discharge with follow-up.   Note: Portions of this report may have been transcribed using voice recognition software. Every effort was made to ensure accuracy; however, inadvertent computerized transcription errors may still be present. Final Clinical Impressions(s) / ED Diagnoses   Final diagnoses:  Generalized abdominal pain    ED Discharge Orders    None       Gari Crown 01/18/18 1719    Daleen Bo, MD 01/19/18 [phone]

## 2018-01-18 NOTE — ED Triage Notes (Signed)
Pt reports right sided abd pain since before thanksgiving gradually worsening and going to above umbilicus.  States she has had nausea with no vomiting and began having diarrhea this morning around 3am.

## 2018-02-06 ENCOUNTER — Ambulatory Visit: Payer: BC Managed Care – PPO | Admitting: Family Medicine

## 2018-02-06 ENCOUNTER — Encounter: Payer: Self-pay | Admitting: Family Medicine

## 2018-02-06 ENCOUNTER — Other Ambulatory Visit: Payer: Self-pay

## 2018-02-06 VITALS — BP 142/82 | HR 90 | Temp 98.8°F | Resp 16 | Ht 67.0 in | Wt 255.0 lb

## 2018-02-06 DIAGNOSIS — E1149 Type 2 diabetes mellitus with other diabetic neurological complication: Secondary | ICD-10-CM | POA: Diagnosis not present

## 2018-02-06 DIAGNOSIS — G629 Polyneuropathy, unspecified: Secondary | ICD-10-CM

## 2018-02-06 DIAGNOSIS — I10 Essential (primary) hypertension: Secondary | ICD-10-CM | POA: Diagnosis not present

## 2018-02-06 DIAGNOSIS — E781 Pure hyperglyceridemia: Secondary | ICD-10-CM | POA: Diagnosis not present

## 2018-02-06 DIAGNOSIS — D72829 Elevated white blood cell count, unspecified: Secondary | ICD-10-CM

## 2018-02-06 DIAGNOSIS — L0201 Cutaneous abscess of face: Secondary | ICD-10-CM

## 2018-02-06 MED ORDER — CLINDAMYCIN HCL 300 MG PO CAPS: 300.0000 mg | ORAL_CAPSULE | Freq: Three times a day (TID) | ORAL | 0 refills | Status: DC

## 2018-02-06 MED ORDER — GABAPENTIN 300 MG PO CAPS: ORAL_CAPSULE | ORAL | 3 refills | Status: DC

## 2018-02-06 MED ORDER — MUPIROCIN CALCIUM 2 % EX CREA: 1.0000 "application " | TOPICAL_CREAM | Freq: Two times a day (BID) | CUTANEOUS | 0 refills | Status: DC

## 2018-02-06 NOTE — Progress Notes (Signed)
Subjective:    Patient ID: Alexandra Christensen, female    DOB: [birth year], 47 y.o.   MRN: [id number]  Patient presents for Follow-up (is not fasting)    She here to follow-up chronic medical problems.  However she has had significant stresses over the past few months.  She was on phentermine for weight loss but admits that she had been eating emotionally.  Her sister had been very ill and she actually passed away yesterday.  She does have her alprazolam to use as needed has a very supportive family.  The past 2 weeks she has had swelling the left side of her face above her lip.  She is actually had multiple infections back to back the past couple months.  She has had no significant drainage from the lesion  more of a scab.  She has been trying to keep keep it clean.  She has not had any fever or chills.  Diabetes mellitus she did not bring her meter with her today but states her blood sugars have been upwards of 140s to 150s.  He is currently taking the metformin. Last A1C in August  7.1%  To have severe neuropathy.  She went back to the gabapentin states the Lyrica did not work.  She would like to increase the bedtime dosing she is currently on 300 mg twice a day.  Reviewed her recent ER visit as well for abdominal pain.  She did have a mildly elevated white blood cells which she has had for the past few months.  Few were associated with the tonsillitis. Review Of Systems:  GEN- denies fatigue, fever, weight loss,weakness, recent illness HEENT- denies eye drainage, change in vision, nasal discharge, CVS- denies chest pain, palpitations RESP- denies SOB, cough, wheeze ABD- denies N/V, change in stools, abd pain GU- denies dysuria, hematuria, dribbling, incontinence MSK- + joint pain, muscle aches, injury Neuro- denies headache, dizziness, syncope, seizure activity       Objective:    BP (!) 142/82   Pulse 90   Temp 98.8 F (37.1 C) (Oral)   Resp 16   Ht 5\' 7"  (1.702 m)   Wt 255 lb  (115.7 kg)   SpO2 96%   BMI 39.94 kg/m  GEN- NAD, alert and oriented x3 HEENT- PERRL, EOMI, non injected sclera, pink conjunctiva, MMM, oropharynx clear Neck- Supple, no thyromegaly, shotty LAD Skin- left cheek swelling with small abscess scab at center- indurated, no appreciable fluctance, mild erythema,  Smile is distorted  CVS- RRR, no murmur RESP-CTAB ABD-NABS,soft,NT,ND Psych- tearful, not anxious appearing,. No SI, well groomed, good eye contact  EXT- No edema Pulses- Radial, DP- 2+        Assessment & Plan:      Problem List Items Addressed This Visit      Unprioritized   Essential hypertension    Pressure is mildly elevated however she has had some discomfort from the facial abscess and swelling.  She admits to not eating correctly as well.  Not to change her medication at this time will we can get her back on track      Hypertriglyceridemia   Relevant Orders   Lipid panel   Neuropathy    Increase the gabapentin to 300 mg in the morning and 600 mg at night.      Type 2 diabetes mellitus with neurological complications (HCC) - Primary    Concerned about her A1c as she has been overeating and eating a lot of junk and comfort  food.  Goal is A1c less than 7%.  We will continue the metformin.  I am going to stop the phentermine as there was no weight loss she ate through the medication pretty much.      Relevant Orders   Basic metabolic panel   Hemoglobin A1c   Lipid panel   CBC with Differential/Platelet    Other Visit Diagnoses    Abscess of face       We will treat with clindamycin 300 mg 3 times a day warm compresses and also use Bactroban to the scabbed area.  Given red flags of when to go to the ER   Relevant Orders   CBC with Differential/Platelet   Leukocytosis, unspecified type       A few have been associated with illness, expect to be elevated currently, will see if any atypical cells noted.Hematology if this persist      Note: This dictation was  prepared with Dragon dictation along with smaller phrase technology. Any transcriptional errors that result from this process are unintentional.

## 2018-02-06 NOTE — Assessment & Plan Note (Signed)
Concerned about her A1c as she has been overeating and eating a lot of junk and comfort food.  Goal is A1c less than 7%.  We will continue the metformin.  I am going to stop the phentermine as there was no weight loss she ate through the medication pretty much.

## 2018-02-06 NOTE — Assessment & Plan Note (Signed)
Increase the gabapentin to 300 mg in the morning and 600 mg at night.

## 2018-02-06 NOTE — Assessment & Plan Note (Signed)
Pressure is mildly elevated however she has had some discomfort from the facial abscess and swelling.  She admits to not eating correctly as well.  Not to change her medication at this time will we can get her back on track

## 2018-02-06 NOTE — Patient Instructions (Addendum)
Increase gabapentin to 600mg  at bedtime, continue 300mg  in the morning Take the clindamycin antibiotic Use the topical antibiotic We will call with lab results  F/U 3 months

## 2018-02-07 LAB — CBC WITH DIFFERENTIAL/PLATELET
Absolute Monocytes: 563 cells/uL (ref 200–950)
BASOS ABS: 67 {cells}/uL (ref 0–200)
Basophils Relative: 0.5 %
EOS PCT: 2.9 %
Eosinophils Absolute: 389 cells/uL (ref 15–500)
HEMATOCRIT: 42.7 % (ref 35.0–45.0)
HEMOGLOBIN: 15.1 g/dL (ref 11.7–15.5)
LYMPHS ABS: 3859 {cells}/uL (ref 850–3900)
MCH: 30.6 pg (ref 27.0–33.0)
MCHC: 35.4 g/dL (ref 32.0–36.0)
MCV: 86.6 fL (ref 80.0–100.0)
MPV: 11.8 fL (ref 7.5–12.5)
Monocytes Relative: 4.2 %
NEUTROS ABS: 8522 {cells}/uL — AB (ref 1500–7800)
NEUTROS PCT: 63.6 %
Platelets: 377 10*3/uL (ref 140–400)
RBC: 4.93 10*6/uL (ref 3.80–5.10)
RDW: 13.5 % (ref 11.0–15.0)
Total Lymphocyte: 28.8 %
WBC: 13.4 10*3/uL — ABNORMAL HIGH (ref 3.8–10.8)

## 2018-02-07 LAB — BASIC METABOLIC PANEL
BUN: 11 mg/dL (ref 7–25)
CHLORIDE: 101 mmol/L (ref 98–110)
CO2: 24 mmol/L (ref 20–32)
CREATININE: 0.93 mg/dL (ref 0.50–1.10)
Calcium: 10.2 mg/dL (ref 8.6–10.2)
Glucose, Bld: 104 mg/dL — ABNORMAL HIGH (ref 65–99)
Potassium: 5 mmol/L (ref 3.5–5.3)
SODIUM: 137 mmol/L (ref 135–146)

## 2018-02-07 LAB — HEMOGLOBIN A1C
Hgb A1c MFr Bld: 6.5 % of total Hgb — ABNORMAL HIGH (ref <5.7)
Mean Plasma Glucose: 140 (calc)
eAG (mmol/L): 7.7 (calc)

## 2018-02-07 LAB — LIPID PANEL
CHOL/HDL RATIO: 3.3 (calc) (ref <5.0)
Cholesterol: 149 mg/dL (ref <200)
HDL: 45 mg/dL — AB (ref >50)
LDL Cholesterol (Calc): 72 mg/dL (calc)
NON-HDL CHOLESTEROL (CALC): 104 mg/dL (ref <130)
Triglycerides: 231 mg/dL — ABNORMAL HIGH (ref <150)

## 2018-03-18 ENCOUNTER — Other Ambulatory Visit: Payer: Self-pay | Admitting: Family Medicine

## 2018-03-25 ENCOUNTER — Other Ambulatory Visit: Payer: Self-pay | Admitting: Family Medicine

## 2018-03-25 NOTE — Telephone Encounter (Signed)
Ok to refill??  Last office visit 02/06/2018.  Last refill 09/12/2016, #3 refills.

## 2018-05-02 ENCOUNTER — Other Ambulatory Visit: Payer: Self-pay | Admitting: Family Medicine

## 2018-05-08 ENCOUNTER — Ambulatory Visit: Payer: BC Managed Care – PPO | Admitting: Family Medicine

## 2018-05-15 ENCOUNTER — Ambulatory Visit: Payer: BC Managed Care – PPO | Admitting: Family Medicine

## 2018-06-11 ENCOUNTER — Other Ambulatory Visit: Payer: Self-pay | Admitting: Family Medicine

## 2018-07-13 ENCOUNTER — Other Ambulatory Visit: Payer: Self-pay | Admitting: Family Medicine

## 2018-07-14 NOTE — Telephone Encounter (Signed)
Requested Prescriptions   Pending Prescriptions Disp Refills  . gabapentin (NEURONTIN) 300 MG capsule [Pharmacy Med Name: Gabapentin 300 MG Oral Capsule] 90 capsule 0    Sig: TAKE 1 CAPSULE BY MOUTH IN THE MORNING AND 2 AT BEDTIME   Last OV 02/06/2018 Last filled 06/11/2018

## 2018-08-17 ENCOUNTER — Other Ambulatory Visit: Payer: Self-pay | Admitting: Family Medicine

## 2018-08-17 NOTE — Telephone Encounter (Signed)
Pt is requesting refill on Xanax   LOV: 02/05/18   LRF:   03/25/18

## 2018-09-20 ENCOUNTER — Other Ambulatory Visit: Payer: Self-pay | Admitting: Family Medicine

## 2018-10-13 ENCOUNTER — Other Ambulatory Visit: Payer: Self-pay

## 2018-10-14 ENCOUNTER — Ambulatory Visit (INDEPENDENT_AMBULATORY_CARE_PROVIDER_SITE_OTHER): Payer: BC Managed Care – PPO | Admitting: Family Medicine

## 2018-10-14 VITALS — BP 112/68 | HR 83 | Temp 98.7°F | Resp 18 | Ht 67.0 in | Wt 251.2 lb

## 2018-10-14 DIAGNOSIS — Z23 Encounter for immunization: Secondary | ICD-10-CM | POA: Diagnosis not present

## 2018-10-14 DIAGNOSIS — E1149 Type 2 diabetes mellitus with other diabetic neurological complication: Secondary | ICD-10-CM

## 2018-10-14 DIAGNOSIS — E781 Pure hyperglyceridemia: Secondary | ICD-10-CM

## 2018-10-14 DIAGNOSIS — I10 Essential (primary) hypertension: Secondary | ICD-10-CM | POA: Diagnosis not present

## 2018-10-14 DIAGNOSIS — Z6841 Body Mass Index (BMI) 40.0 and over, adult: Secondary | ICD-10-CM

## 2018-10-14 MED ORDER — GABAPENTIN 300 MG PO CAPS: ORAL_CAPSULE | ORAL | 3 refills | Status: DC

## 2018-10-14 MED ORDER — ATORVASTATIN CALCIUM 20 MG PO TABS: 20.0000 mg | ORAL_TABLET | Freq: Every day | ORAL | 3 refills | Status: DC

## 2018-10-14 MED ORDER — IBUPROFEN 800 MG PO TABS: 800.0000 mg | ORAL_TABLET | Freq: Three times a day (TID) | ORAL | 1 refills | Status: DC | PRN

## 2018-10-14 MED ORDER — LOSARTAN POTASSIUM 25 MG PO TABS: 25.0000 mg | ORAL_TABLET | Freq: Every day | ORAL | 2 refills | Status: DC

## 2018-10-14 MED ORDER — METFORMIN HCL 1000 MG PO TABS: ORAL_TABLET | ORAL | 2 refills | Status: DC

## 2018-10-14 NOTE — Progress Notes (Signed)
   Subjective:    Patient ID: Alexandra Christensen, female    DOB: 09/01/1970, 48 y.o.   MRN: [id number]  Patient presents for Medication Refill (and f/u) Patient here to follow-up chronic medical problems. Her last visit was in December 2019. Medications reviewed   Diabetes mellitus her last A1c-6.5% in December, he is currently on metformin at thousand milligrams twice a day  she is retiring next month, plan to travel enjoy time with husband and concentrate on her health, her ultimate goal and to get off her medications using diet and weight loss  Peripheral neuropathy she is currently on gabapentin 300 mg a.m. and 600 mg at bedtime   Hyperlipidemia-is currently on Lipitor 20 mg  Hypertension losartan 25 mg once a day  Insomnia- uses xanax on rare occ  Review Of Systems:  GEN- denies fatigue, fever, weight loss,weakness, recent illness HEENT- denies eye drainage, change in vision, nasal discharge, CVS- denies chest pain, palpitations RESP- denies SOB, cough, wheeze ABD- denies N/V, change in stools, abd pain GU- denies dysuria, hematuria, dribbling, incontinence MSK- denies joint pain, muscle aches, injury Neuro- denies headache, dizziness, syncope, seizure activity       Objective:    BP 112/68 (BP Location: Right Arm, Patient Position: Sitting, Cuff Size: Large)   Pulse 83   Temp 98.7 F (37.1 C) (Oral)   Resp 18   Ht 5\' 7"  (1.702 m)   Wt 251 lb 3.2 oz (113.9 kg)   SpO2 95%   BMI 39.34 kg/m  GEN- NAD, alert and oriented x3,obese  HEENT- PERRL, EOMI, non injected sclera, pink conjunctiva, MMM, oropharynx clear Neck- Supple, no thyromegaly CVS- RRR, no murmur RESP-CTAB ABD-NABS,soft,NT,ND EXT- No edema Pulses- Radial, DP- 2+        Assessment & Plan:      Problem List Items Addressed This Visit      Unprioritized   Essential hypertension - Primary    Controlled no changes  Check renal function       Relevant Medications   losartan (COZAAR) 25 MG  tablet   atorvastatin (LIPITOR) 20 MG tablet   Other Relevant Orders   Comprehensive metabolic panel (Completed)   Hypertriglyceridemia   Relevant Medications   losartan (COZAAR) 25 MG tablet   atorvastatin (LIPITOR) 20 MG tablet   Other Relevant Orders   Lipid panel (Completed)   Obesity   Relevant Medications   metFORMIN (GLUCOPHAGE) 1000 MG tablet   Type 2 diabetes mellitus with neurological complications (HCC)    Recheck A1C goal less than 7% Continue metformin Work on dietary changes  On statin drug       Relevant Medications   losartan (COZAAR) 25 MG tablet   metFORMIN (GLUCOPHAGE) 1000 MG tablet   atorvastatin (LIPITOR) 20 MG tablet   Other Relevant Orders   CBC with Differential/Platelet (Completed)   Comprehensive metabolic panel (Completed)   Hemoglobin A1c (Completed)   Microalbumin / creatinine urine ratio (Completed)   HM DIABETES FOOT EXAM (Completed)    Other Visit Diagnoses    Need for immunization against influenza       Relevant Orders   Flu Vaccine QUAD 36+ mos IM (Completed)      Note: This dictation was prepared with Dragon dictation along with smaller phrase technology. Any transcriptional errors that result from this process are unintentional.

## 2018-10-14 NOTE — Patient Instructions (Signed)
F/U 4 months  

## 2018-10-15 ENCOUNTER — Other Ambulatory Visit: Payer: Self-pay | Admitting: *Deleted

## 2018-10-15 ENCOUNTER — Encounter: Payer: Self-pay | Admitting: Family Medicine

## 2018-10-15 DIAGNOSIS — D72829 Elevated white blood cell count, unspecified: Secondary | ICD-10-CM

## 2018-10-15 LAB — CBC WITH DIFFERENTIAL/PLATELET
Absolute Monocytes: 813 cells/uL (ref 200–950)
Basophils Absolute: 104 cells/uL (ref 0–200)
Basophils Relative: 0.6 %
Eosinophils Absolute: 363 cells/uL (ref 15–500)
Eosinophils Relative: 2.1 %
HCT: 44.4 % (ref 35.0–45.0)
Hemoglobin: 14.9 g/dL (ref 11.7–15.5)
Lymphs Abs: 3391 cells/uL (ref 850–3900)
MCH: 30 pg (ref 27.0–33.0)
MCHC: 33.6 g/dL (ref 32.0–36.0)
MCV: 89.3 fL (ref 80.0–100.0)
MPV: 11.7 fL (ref 7.5–12.5)
Monocytes Relative: 4.7 %
Neutro Abs: 12629 cells/uL — ABNORMAL HIGH (ref 1500–7800)
Neutrophils Relative %: 73 %
Platelets: 335 10*3/uL (ref 140–400)
RBC: 4.97 10*6/uL (ref 3.80–5.10)
RDW: 13 % (ref 11.0–15.0)
Total Lymphocyte: 19.6 %
WBC: 17.3 10*3/uL — ABNORMAL HIGH (ref 3.8–10.8)

## 2018-10-15 LAB — COMPREHENSIVE METABOLIC PANEL
AG Ratio: 1.4 (calc) (ref 1.0–2.5)
ALT: 14 U/L (ref 6–29)
AST: 12 U/L (ref 10–35)
Albumin: 4 g/dL (ref 3.6–5.1)
Alkaline phosphatase (APISO): 99 U/L (ref 37–153)
BUN: 12 mg/dL (ref 7–25)
CO2: 22 mmol/L (ref 20–32)
Calcium: 9.7 mg/dL (ref 8.6–10.4)
Chloride: 102 mmol/L (ref 98–110)
Creat: 1.05 mg/dL (ref 0.50–1.05)
Globulin: 2.9 g/dL (calc) (ref 1.9–3.7)
Glucose, Bld: 99 mg/dL (ref 65–99)
Potassium: 4.6 mmol/L (ref 3.5–5.3)
Sodium: 137 mmol/L (ref 135–146)
Total Bilirubin: 0.6 mg/dL (ref 0.2–1.2)
Total Protein: 6.9 g/dL (ref 6.1–8.1)

## 2018-10-15 LAB — MICROALBUMIN / CREATININE URINE RATIO
Creatinine, Urine: 142 mg/dL (ref 20–275)
Microalb Creat Ratio: 8 mcg/mg creat (ref <30)
Microalb, Ur: 1.1 mg/dL

## 2018-10-15 LAB — LIPID PANEL
Cholesterol: 126 mg/dL (ref <200)
HDL: 47 mg/dL — ABNORMAL LOW (ref >=50)
LDL Cholesterol (Calc): 56 mg/dL (calc)
Non-HDL Cholesterol (Calc): 79 mg/dL (calc) (ref <130)
Total CHOL/HDL Ratio: 2.7 (calc) (ref <5.0)
Triglycerides: 155 mg/dL — ABNORMAL HIGH (ref <150)

## 2018-10-15 LAB — HEMOGLOBIN A1C
Hgb A1c MFr Bld: 6.3 % of total Hgb — ABNORMAL HIGH (ref <5.7)
Mean Plasma Glucose: 134 (calc)
eAG (mmol/L): 7.4 (calc)

## 2018-10-15 NOTE — Assessment & Plan Note (Signed)
Recheck A1C goal less than 7% Continue metformin Work on dietary changes  On statin drug

## 2018-10-15 NOTE — Assessment & Plan Note (Signed)
Controlled no changes Check renal function 

## 2018-10-19 ENCOUNTER — Other Ambulatory Visit: Payer: Self-pay

## 2018-10-19 ENCOUNTER — Other Ambulatory Visit: Payer: BC Managed Care – PPO

## 2018-10-19 DIAGNOSIS — D72829 Elevated white blood cell count, unspecified: Secondary | ICD-10-CM

## 2018-10-20 LAB — CBC WITH DIFFERENTIAL/PLATELET
Absolute Monocytes: 740 cells/uL (ref 200–950)
Basophils Absolute: 69 cells/uL (ref 0–200)
Basophils Relative: 0.5 %
Eosinophils Absolute: 301 cells/uL (ref 15–500)
Eosinophils Relative: 2.2 %
HCT: 40.9 % (ref 35.0–45.0)
Hemoglobin: 13.7 g/dL (ref 11.7–15.5)
Lymphs Abs: 3398 cells/uL (ref 850–3900)
MCH: 30.4 pg (ref 27.0–33.0)
MCHC: 33.5 g/dL (ref 32.0–36.0)
MCV: 90.9 fL (ref 80.0–100.0)
MPV: 11.7 fL (ref 7.5–12.5)
Monocytes Relative: 5.4 %
Neutro Abs: 9193 cells/uL — ABNORMAL HIGH (ref 1500–7800)
Neutrophils Relative %: 67.1 %
Platelets: 339 10*3/uL (ref 140–400)
RBC: 4.5 10*6/uL (ref 3.80–5.10)
RDW: 13.1 % (ref 11.0–15.0)
Total Lymphocyte: 24.8 %
WBC: 13.7 10*3/uL — ABNORMAL HIGH (ref 3.8–10.8)

## 2018-10-20 LAB — PATHOLOGIST SMEAR REVIEW

## 2019-01-04 ENCOUNTER — Ambulatory Visit: Payer: BC Managed Care – PPO | Admitting: Family Medicine

## 2019-01-04 ENCOUNTER — Encounter: Payer: Self-pay | Admitting: Family Medicine

## 2019-01-04 ENCOUNTER — Other Ambulatory Visit: Payer: Self-pay

## 2019-01-04 VITALS — BP 156/100 | HR 106 | Temp 97.5°F | Resp 18 | Ht 67.0 in | Wt 247.0 lb

## 2019-01-04 DIAGNOSIS — K611 Rectal abscess: Secondary | ICD-10-CM | POA: Diagnosis not present

## 2019-01-04 MED ORDER — SULFAMETHOXAZOLE-TRIMETHOPRIM 800-160 MG PO TABS: 1.0000 | ORAL_TABLET | Freq: Two times a day (BID) | ORAL | 0 refills | Status: DC

## 2019-01-04 NOTE — Progress Notes (Signed)
Subjective:    Patient ID: Alexandra Christensen, female    DOB: 09-06-1970, 48 y.o.   MRN: [id number]  HPI Starting yesterday, the patient developed a painful firm indurated area adjacent to the rectum on her left gluteus.  The firm indurated area is approximately 3 cm in diameter.  It is warm and tender to touch.  Chaperone was present for the exam.  When I pulled the gluteal cleft apart, there was purulent material appearing to emanate from the rectum.  There was no visible drainage coming from the skin.  The area was still indurated and firm indicating possibly a spontaneous rupture internally but not adequate drainage.  Therefore I recommended an incision and drainage externally of what appears to be a perirectal abscess Past Medical History:  Diagnosis Date  . Diabetes mellitus without complication ([place])   . Hyperlipidemia   . Hypertension    Past Surgical History:  Procedure Laterality Date  . TEE WITHOUT CARDIOVERSION N/A 06/16/2013   Procedure: TRANSESOPHAGEAL ECHOCARDIOGRAM (TEE);  Surgeon: Pixie Casino, MD;  Location: [facility] ENDOSCOPY;  Service: Cardiovascular;  Laterality: N/A;   Current Outpatient Medications on File Prior to Visit  Medication Sig Dispense Refill  . ALPRAZolam (XANAX) 1 MG tablet TAKE 1 TABLET BY MOUTH AT BEDTIME AS NEEDED FOR SLEEP 30 tablet 0  . atorvastatin (LIPITOR) 20 MG tablet Take 1 tablet (20 mg total) by mouth daily. 90 tablet 3  . Blood Glucose Monitoring Suppl (BLOOD GLUCOSE SYSTEM PAK) KIT Dispense based on patient and insurance preference. Use to monitor FSBS 1x daily. Dx: E11.9 1 each 1  . gabapentin (NEURONTIN) 300 MG capsule TAKE 1 CAPSULE BY MOUTH IN THE MORNING AND 2 AT BEDTIME 270 capsule 3  . glucose blood (ONE TOUCH ULTRA TEST) test strip USE TO CHECK FASTING BLOOD GLUCOSE ONCE DAILY 50 each 3  . ibuprofen (ADVIL) 800 MG tablet Take 1 tablet (800 mg total) by mouth every 8 (eight) hours as needed. 90 tablet 1  . Lancet Devices MISC Dispense  based on patient and insurance preference. Use to monitor FSBS 1x daily. Dx: E11.9 1 each 1  . Lancets MISC Dispense based on patient and insurance preference. Use to monitor FSBS 1x daily. Dx: E11.9 100 each 1  . losartan (COZAAR) 25 MG tablet Take 1 tablet (25 mg total) by mouth daily. 90 tablet 2  . metFORMIN (GLUCOPHAGE) 1000 MG tablet TAKE 1 TABLET BY MOUTH TWICE DAILY WITH A MEAL. 180 tablet 2   No current facility-administered medications on file prior to visit.    No Known Allergies  Social History   Socioeconomic History  . Marital status: Single    Spouse name: Not on file  . Number of children: Not on file  . Years of education: Not on file  . Highest education level: Not on file  Occupational History  . Not on file  Social Needs  . Financial resource strain: Not on file  . Food insecurity    Worry: Not on file    Inability: Not on file  . Transportation needs    Medical: Not on file    Non-medical: Not on file  Tobacco Use  . Smoking status: Current Every Day Smoker    Packs/day: 0.50    Types: Cigarettes    Last attempt to quit: 03/21/2017    Years since quitting: 1.7  . Smokeless tobacco: Never Used  Substance and Sexual Activity  . Alcohol use: Yes    Alcohol/week: 1.0 -  2.0 standard drinks    Types: 1 - 2 Glasses of wine per week    Comment: occasionally  . Drug use: No  . Sexual activity: Yes  Lifestyle  . Physical activity    Days per week: Not on file    Minutes per session: Not on file  . Stress: Not on file  Relationships  . Social Herbalist on phone: Not on file    Gets together: Not on file    Attends religious service: Not on file    Active member of club or organization: Not on file    Attends meetings of clubs or organizations: Not on file    Relationship status: Not on file  . Intimate partner violence    Fear of current or ex partner: Not on file    Emotionally abused: Not on file    Physically abused: Not on file    Forced  sexual activity: Not on file  Other Topics Concern  . Not on file  Social History Narrative  . Not on file   Family History  Problem Relation Age of Onset  . Diabetes Mother   . Heart disease Mother   . Kidney disease Father   . Heart disease Father   . Stroke Sister 40  . Cancer Sister        AML      Review of Systems  All other systems reviewed and are negative.      Objective:   Physical Exam Vitals signs reviewed.  Cardiovascular:     Rate and Rhythm: Normal rate and regular rhythm.     Heart sounds: Normal heart sounds.  Pulmonary:     Effort: Pulmonary effort is normal. No respiratory distress.     Breath sounds: Normal breath sounds. No stridor. No wheezing or rales.  Abdominal:     General: Bowel sounds are normal. There is no distension.     Palpations: Abdomen is soft. There is no mass.     Tenderness: There is no abdominal tenderness. There is no guarding or rebound.  Genitourinary:            Assessment & Plan:  Perirectal abscess  The skin overlying the perirectal abscess was anesthetized with 0.1% lidocaine with epinephrine.  A 1 cm vertical incision was made in the area of maximum firmness as indicated by the black vertical line shown in the diagram above.  This was then opened with a pair hemostats and the wound probe to a depth of approximately 4 cm.  Bloody serous purulent material was expressed.  However I estimate less than 5 cc.  The wound was probed with several Q-tip soaked in hydrogen peroxide to ensure that there were no loculated pockets.  The wound was then packed with 6 inches of 1/4 inch iodoform gauze.  Begin Bactrim double strength tablets 1 p.o. twice daily for 7 days.  Recheck here on Thursday.  Wound care was discussed for the next 48 hours including pulling a small amount of the gauze out every day as well as sitz bath as directed.  I will remove the residual packing and repacked the wound on Thursday if necessary.  Seek medical  attention sooner if worsening

## 2019-01-07 ENCOUNTER — Ambulatory Visit (INDEPENDENT_AMBULATORY_CARE_PROVIDER_SITE_OTHER): Payer: BC Managed Care – PPO | Admitting: Family Medicine

## 2019-01-07 ENCOUNTER — Encounter: Payer: Self-pay | Admitting: Family Medicine

## 2019-01-07 ENCOUNTER — Other Ambulatory Visit: Payer: Self-pay

## 2019-01-07 VITALS — BP 138/84 | HR 90 | Temp 98.2°F | Resp 18 | Ht 67.0 in | Wt 247.0 lb

## 2019-01-07 DIAGNOSIS — K611 Rectal abscess: Secondary | ICD-10-CM

## 2019-01-07 NOTE — Progress Notes (Signed)
Subjective:    Patient ID: Alexandra Christensen, female    DOB: 06-06-1970, 48 y.o.   MRN: [id number]  HPI  01/04/19 Starting yesterday, the patient developed a painful firm indurated area adjacent to the rectum on her left gluteus.  The firm indurated area is approximately 3 cm in diameter.  It is warm and tender to touch.  Chaperone was present for the exam.  When I pulled the gluteal cleft apart, there was purulent material appearing to emanate from the rectum.  There was no visible drainage coming from the skin.  The area was still indurated and firm indicating possibly a spontaneous rupture internally but not adequate drainage.  Therefore I recommended an incision and drainage externally of what appears to be a perirectal abscess.  At that time, my plan was: The skin overlying the perirectal abscess was anesthetized with 0.1% lidocaine with epinephrine.  A 1 cm vertical incision was made in the area of maximum firmness as indicated by the black vertical line shown in the diagram above.  This was then opened with a pair hemostats and the wound probe to a depth of approximately 4 cm.  Bloody serous purulent material was expressed.  However I estimate less than 5 cc.  The wound was probed with several Q-tip soaked in hydrogen peroxide to ensure that there were no loculated pockets.  The wound was then packed with 6 inches of 1/4 inch iodoform gauze.  Begin Bactrim double strength tablets 1 p.o. twice daily for 7 days.  Recheck here on Thursday.  Wound care was discussed for the next 48 hours including pulling a small amount of the gauze out every day as well as sitz bath as directed.  I will remove the residual packing and repacked the wound on Thursday if necessary.  Seek medical attention sooner if worsening  01/07/19 Patient is here today for recheck.  She states the pain has essentially resolved.  She feels much better.  The swelling and induration on her left gluteal cleft has diminished greatly  however the packing is still in place.  She would like me to remove the packing today  Past Medical History:  Diagnosis Date  . Diabetes mellitus without complication ([place])   . Hyperlipidemia   . Hypertension    Past Surgical History:  Procedure Laterality Date  . TEE WITHOUT CARDIOVERSION N/A 06/16/2013   Procedure: TRANSESOPHAGEAL ECHOCARDIOGRAM (TEE);  Surgeon: Pixie Casino, MD;  Location: [facility] ENDOSCOPY;  Service: Cardiovascular;  Laterality: N/A;   Current Outpatient Medications on File Prior to Visit  Medication Sig Dispense Refill  . ALPRAZolam (XANAX) 1 MG tablet TAKE 1 TABLET BY MOUTH AT BEDTIME AS NEEDED FOR SLEEP 30 tablet 0  . atorvastatin (LIPITOR) 20 MG tablet Take 1 tablet (20 mg total) by mouth daily. 90 tablet 3  . Blood Glucose Monitoring Suppl (BLOOD GLUCOSE SYSTEM PAK) KIT Dispense based on patient and insurance preference. Use to monitor FSBS 1x daily. Dx: E11.9 1 each 1  . gabapentin (NEURONTIN) 300 MG capsule TAKE 1 CAPSULE BY MOUTH IN THE MORNING AND 2 AT BEDTIME 270 capsule 3  . glucose blood (ONE TOUCH ULTRA TEST) test strip USE TO CHECK FASTING BLOOD GLUCOSE ONCE DAILY 50 each 3  . ibuprofen (ADVIL) 800 MG tablet Take 1 tablet (800 mg total) by mouth every 8 (eight) hours as needed. 90 tablet 1  . Lancet Devices MISC Dispense based on patient and insurance preference. Use to monitor FSBS 1x daily. Dx: E11.9  1 each 1  . Lancets MISC Dispense based on patient and insurance preference. Use to monitor FSBS 1x daily. Dx: E11.9 100 each 1  . losartan (COZAAR) 25 MG tablet Take 1 tablet (25 mg total) by mouth daily. 90 tablet 2  . metFORMIN (GLUCOPHAGE) 1000 MG tablet TAKE 1 TABLET BY MOUTH TWICE DAILY WITH A MEAL. 180 tablet 2  . sulfamethoxazole-trimethoprim (BACTRIM DS) 800-160 MG tablet Take 1 tablet by mouth 2 (two) times daily. 14 tablet 0   No current facility-administered medications on file prior to visit.    No Known Allergies  Social History    Socioeconomic History  . Marital status: Single    Spouse name: Not on file  . Number of children: Not on file  . Years of education: Not on file  . Highest education level: Not on file  Occupational History  . Not on file  Social Needs  . Financial resource strain: Not on file  . Food insecurity    Worry: Not on file    Inability: Not on file  . Transportation needs    Medical: Not on file    Non-medical: Not on file  Tobacco Use  . Smoking status: Current Every Day Smoker    Packs/day: 0.50    Types: Cigarettes    Last attempt to quit: 03/21/2017    Years since quitting: 1.8  . Smokeless tobacco: Never Used  Substance and Sexual Activity  . Alcohol use: Yes    Alcohol/week: 1.0 - 2.0 standard drinks    Types: 1 - 2 Glasses of wine per week    Comment: occasionally  . Drug use: No  . Sexual activity: Yes  Lifestyle  . Physical activity    Days per week: Not on file    Minutes per session: Not on file  . Stress: Not on file  Relationships  . Social Herbalist on phone: Not on file    Gets together: Not on file    Attends religious service: Not on file    Active member of club or organization: Not on file    Attends meetings of clubs or organizations: Not on file    Relationship status: Not on file  . Intimate partner violence    Fear of current or ex partner: Not on file    Emotionally abused: Not on file    Physically abused: Not on file    Forced sexual activity: Not on file  Other Topics Concern  . Not on file  Social History Narrative  . Not on file   Family History  Problem Relation Age of Onset  . Diabetes Mother   . Heart disease Mother   . Kidney disease Father   . Heart disease Father   . Stroke Sister 66  . Cancer Sister        AML      Review of Systems  All other systems reviewed and are negative.      Objective:   Physical Exam Vitals signs reviewed.  Cardiovascular:     Rate and Rhythm: Normal rate and regular rhythm.      Heart sounds: Normal heart sounds.  Pulmonary:     Effort: Pulmonary effort is normal. No respiratory distress.     Breath sounds: Normal breath sounds. No stridor. No wheezing or rales.  Abdominal:     General: Bowel sounds are normal. There is no distension.     Palpations: Abdomen is soft. There is  no mass.     Tenderness: There is no abdominal tenderness. There is no guarding or rebound.  Genitourinary:            Assessment & Plan:  Perirectal abscess Healing nicely.  All the packing was removed today.  Wound care was discussed.  I recommended using a sitz bath every day to help clean the wound.  I recommended applying a copious amount of Neosporin to act as a liquid Band-Aid over the opening every day to prevent secondary infection.  Anticipate healing through secondary intention over the next 4 to 5 days.  Recheck next week if not better or immediately if worse.

## 2019-02-15 ENCOUNTER — Other Ambulatory Visit: Payer: Self-pay

## 2019-02-15 ENCOUNTER — Encounter: Payer: Self-pay | Admitting: Family Medicine

## 2019-02-15 ENCOUNTER — Ambulatory Visit (INDEPENDENT_AMBULATORY_CARE_PROVIDER_SITE_OTHER): Payer: BC Managed Care – PPO | Admitting: Family Medicine

## 2019-02-15 VITALS — BP 128/62 | HR 96 | Temp 98.2°F | Resp 14 | Ht 67.0 in | Wt 244.0 lb

## 2019-02-15 DIAGNOSIS — I10 Essential (primary) hypertension: Secondary | ICD-10-CM

## 2019-02-15 DIAGNOSIS — L0231 Cutaneous abscess of buttock: Secondary | ICD-10-CM

## 2019-02-15 DIAGNOSIS — E781 Pure hyperglyceridemia: Secondary | ICD-10-CM

## 2019-02-15 DIAGNOSIS — G629 Polyneuropathy, unspecified: Secondary | ICD-10-CM

## 2019-02-15 DIAGNOSIS — K1370 Unspecified lesions of oral mucosa: Secondary | ICD-10-CM

## 2019-02-15 DIAGNOSIS — E1149 Type 2 diabetes mellitus with other diabetic neurological complication: Secondary | ICD-10-CM | POA: Diagnosis not present

## 2019-02-15 MED ORDER — SULFAMETHOXAZOLE-TRIMETHOPRIM 800-160 MG PO TABS: 1.0000 | ORAL_TABLET | Freq: Two times a day (BID) | ORAL | 0 refills | Status: DC

## 2019-02-15 MED ORDER — FLUCONAZOLE 150 MG PO TABS: 150.0000 mg | ORAL_TABLET | Freq: Once | ORAL | 0 refills | Status: AC

## 2019-02-15 MED ORDER — FLUCONAZOLE 150 MG PO TABS: 150.0000 mg | ORAL_TABLET | Freq: Once | ORAL | 0 refills | Status: DC

## 2019-02-15 NOTE — Assessment & Plan Note (Signed)
Continue gabapentin.

## 2019-02-15 NOTE — Assessment & Plan Note (Signed)
Blood pressure is controlled no change in medication.  We will check her renal function and CBC.  We will also check her fasting labs for her hypertriglyceridemia she is on statin drug.

## 2019-02-15 NOTE — Patient Instructions (Signed)
F/U 4 months  Call if antibiotics do not work

## 2019-02-15 NOTE — Progress Notes (Signed)
Subjective:    Patient ID: Alexandra Christensen, female    DOB: 1970/08/17, 48 y.o.   MRN: [id number]  Patient presents for Follow-up (is not fasting)  Pt here to f/u chronic medical problems  Medications reviewed   Has small abscess that has reoccured on her buttocks, she was squeeze pus out, has little discomfort when she sits , no fever.  She had larger abscess drained by my partner back in November   HTN-taking losartan 25mg  once a day, stress levels have improved since she retired  Hyperlipidemia- taking lipitor    DM-  Last A1C 6.3%, metformin 1000mg  BID he does not check her blood sugars.  She also has a spot on her tongue that gets irritated. she states it flares up on and off since she had her oral thrush and tonsillitis a year ago.  She does have upcoming appointment with her dentist.  It does not bleed.  She also eats sweet and sour candies and thinks that this may irritated.  She also has dry mouth   [place]:  GEN- denies fatigue, fever, weight loss,weakness, recent illness HEENT- denies eye drainage, change in vision, nasal discharge, CVS- denies chest pain, palpitations RESP- denies SOB, cough, wheeze ABD- denies N/V, change in stools, abd pain GU- denies dysuria, hematuria, dribbling, incontinence MSK- denies joint pain, muscle aches, injury Neuro- denies headache, dizziness, syncope, seizure activity       Objective:    BP 128/62   Pulse 96   Temp 98.2 F (36.8 C) (Temporal)   Resp 14   Ht 5\' 7"  (1.702 m)   Wt 244 lb (110.7 kg)   SpO2 97%   BMI 38.22 kg/m  GEN- NAD, alert and oriented x3 HEENT- PERRL, EOMI, non injected sclera, pink conjunctiva, MMM, oropharynx clear small erythematous small spot mid right side of tongue Neck- Supple, no thyromegaly CVS- RRR, no murmur RESP-CTAB ABD-NABS,soft,NT,ND EXT- No edema Pulses- Radial, DP- 2+  Skin- left gluteal cleft indurated areas 1-2 inches long erythema no  pustular or fluctuant area mild tenderness to palpation      Assessment & Plan:      Problem List Items Addressed This Visit      Unprioritized   Essential hypertension - Primary    Blood pressure is controlled no change in medication.  We will check her renal function and CBC.  We will also check her fasting labs for her hypertriglyceridemia she is on statin drug.      Relevant Orders   Comprehensive metabolic panel   CBC with Differential   Hypertriglyceridemia   Relevant Orders   Lipid Panel   Neuropathy    Continue gabapentin.      Type 2 diabetes mellitus with neurological complications (HCC)    Goal is A1c less than 7%.  Work on dietary changes cutting down on the carbs in the sweets.  Continue Metformin.      Relevant Orders   Hemoglobin A1c   [facility] Diabetes Foot Exam (Completed)    Other Visit Diagnoses    Gluteal abscess       No area to I&D today.  We will have her use Epson salt soaks as well as restart Bactrim if it does come to ahead again will open up   Oral lesion       Can follow-up with her dentist.  Appears to be more of an irritated spot but needs closer evaluation  Note: This dictation was prepared with Dragon dictation along with smaller phrase technology. Any transcriptional errors that result from this process are unintentional.

## 2019-02-15 NOTE — Assessment & Plan Note (Signed)
Goal is A1c less than 7%.  Work on dietary changes cutting down on the carbs in the sweets.  Continue Metformin.

## 2019-02-16 LAB — CBC WITH DIFFERENTIAL/PLATELET
Absolute Monocytes: 512 cells/uL (ref 200–950)
Basophils Absolute: 71 cells/uL (ref 0–200)
Basophils Relative: 0.6 %
Eosinophils Absolute: 428 cells/uL (ref 15–500)
Eosinophils Relative: 3.6 %
HCT: 43.3 % (ref 35.0–45.0)
Hemoglobin: 14.6 g/dL (ref 11.7–15.5)
Lymphs Abs: 3392 cells/uL (ref 850–3900)
MCH: 29.7 pg (ref 27.0–33.0)
MCHC: 33.7 g/dL (ref 32.0–36.0)
MCV: 88 fL (ref 80.0–100.0)
MPV: 11.7 fL (ref 7.5–12.5)
Monocytes Relative: 4.3 %
Neutro Abs: 7497 cells/uL (ref 1500–7800)
Neutrophils Relative %: 63 %
Platelets: 330 10*3/uL (ref 140–400)
RBC: 4.92 10*6/uL (ref 3.80–5.10)
RDW: 13.7 % (ref 11.0–15.0)
Total Lymphocyte: 28.5 %
WBC: 11.9 10*3/uL — ABNORMAL HIGH (ref 3.8–10.8)

## 2019-02-16 LAB — COMPREHENSIVE METABOLIC PANEL
AG Ratio: 1.6 (calc) (ref 1.0–2.5)
ALT: 18 U/L (ref 6–29)
AST: 13 U/L (ref 10–35)
Albumin: 4.2 g/dL (ref 3.6–5.1)
Alkaline phosphatase (APISO): 86 U/L (ref 37–153)
BUN: 11 mg/dL (ref 7–25)
CO2: 25 mmol/L (ref 20–32)
Calcium: 10.1 mg/dL (ref 8.6–10.4)
Chloride: 104 mmol/L (ref 98–110)
Creat: 0.86 mg/dL (ref 0.50–1.05)
Globulin: 2.7 g/dL (calc) (ref 1.9–3.7)
Glucose, Bld: 101 mg/dL — ABNORMAL HIGH (ref 65–99)
Potassium: 4.6 mmol/L (ref 3.5–5.3)
Sodium: 142 mmol/L (ref 135–146)
Total Bilirubin: 0.4 mg/dL (ref 0.2–1.2)
Total Protein: 6.9 g/dL (ref 6.1–8.1)

## 2019-02-16 LAB — HEMOGLOBIN A1C
Hgb A1c MFr Bld: 6.6 % of total Hgb — ABNORMAL HIGH (ref <5.7)
Mean Plasma Glucose: 143 (calc)
eAG (mmol/L): 7.9 (calc)

## 2019-02-16 LAB — LIPID PANEL
Cholesterol: 159 mg/dL (ref <200)
HDL: 48 mg/dL — ABNORMAL LOW (ref >=50)
LDL Cholesterol (Calc): 81 mg/dL (calc)
Non-HDL Cholesterol (Calc): 111 mg/dL (calc) (ref <130)
Total CHOL/HDL Ratio: 3.3 (calc) (ref <5.0)
Triglycerides: 201 mg/dL — ABNORMAL HIGH (ref <150)

## 2019-02-22 IMAGING — CT CT NECK W/ CM
4 of 5 series · 16 of 33 positions shown, 18 images · IV contrast (omnipaque)
Comparison: None.

CLINICAL DATA: Peritonsillar abscess.  Left throat swelling

EXAM:
CT NECK WITH CONTRAST
TECHNIQUE: Multidetector CT imaging of the neck was performed using the
standard protocol following the bolus administration of intravenous
contrast.
CONTRAST:  75mL OMNIPAQUE IOHEXOL 300 MG/ML  SOLN

[Series 2: axial neck · axial · 0.48mm/px · z∈[-239,-101]mm · 4 of 117 slices shown, 5 images]
[im 24/117  soft-tissue]
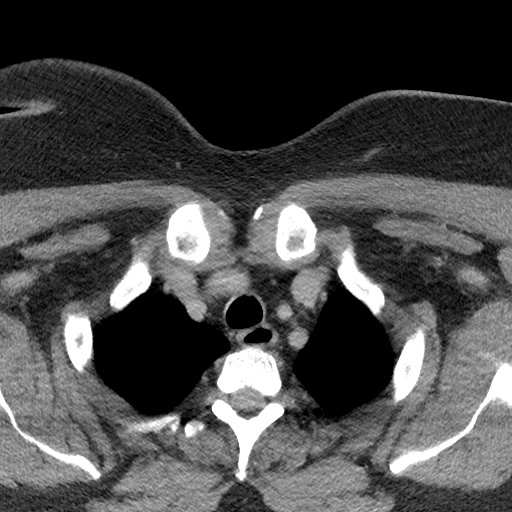
[im 24/117  bone]
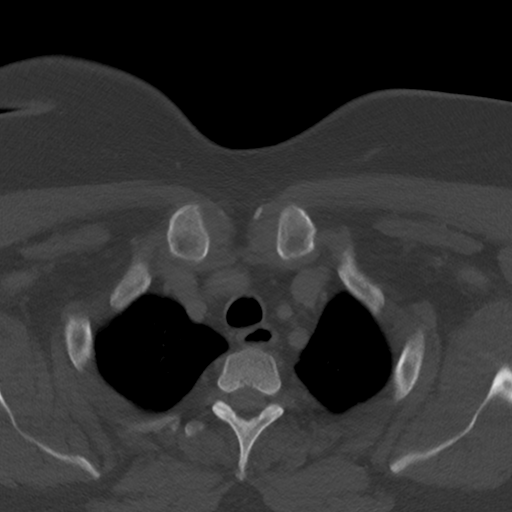
[im 47/117  bone]
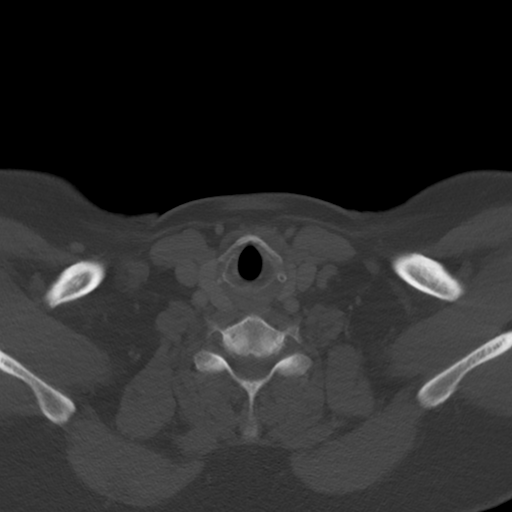
[im 70/117  bone]
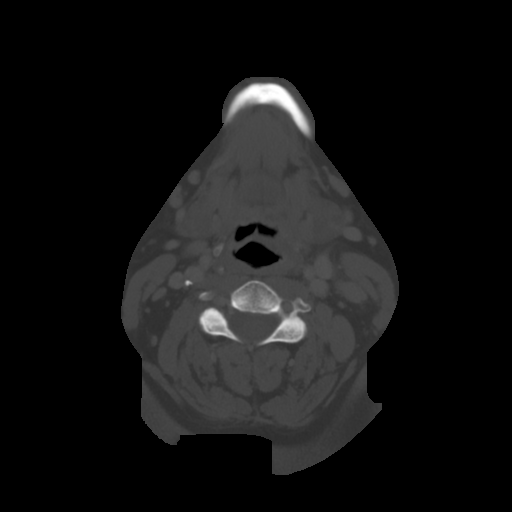
[im 93/117  bone]
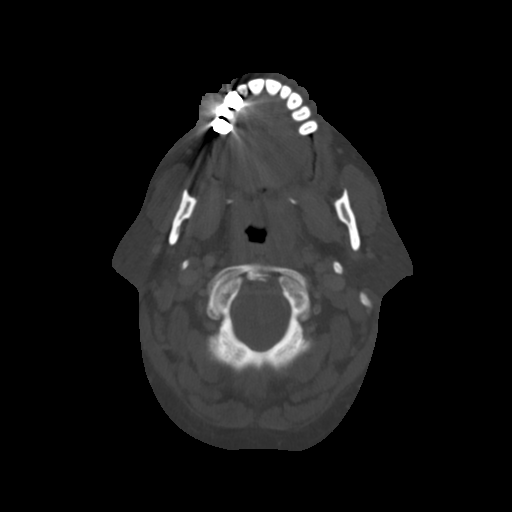

[Series 6: coronal neck · coronal · 0.47mm/px · 3 of 119 slices shown]
[im 24/119  bone]
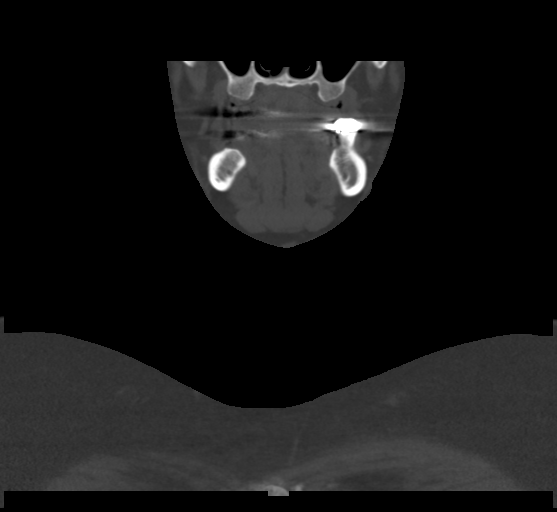
[im 48/119  bone]
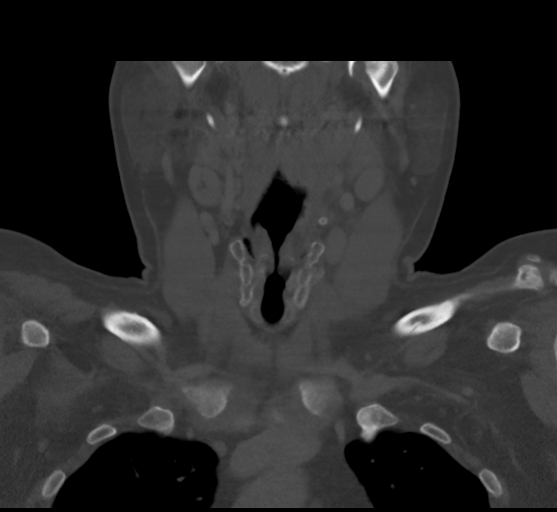
[im 71/119  bone]
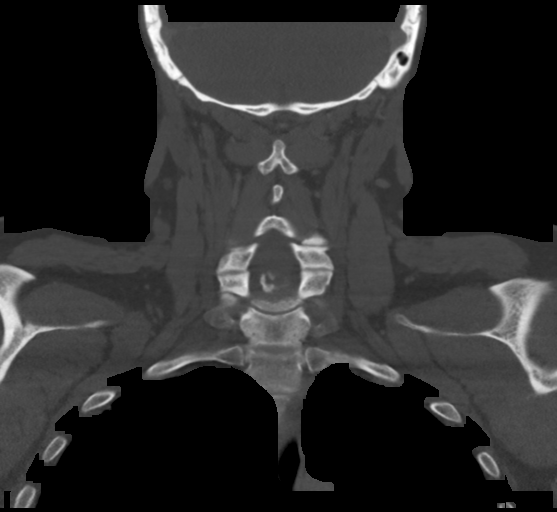

[Series 7: sagittal neck · sagittal · 0.46mm/px · 5 of 103 slices shown, 6 images]
[im 35/103  bone]
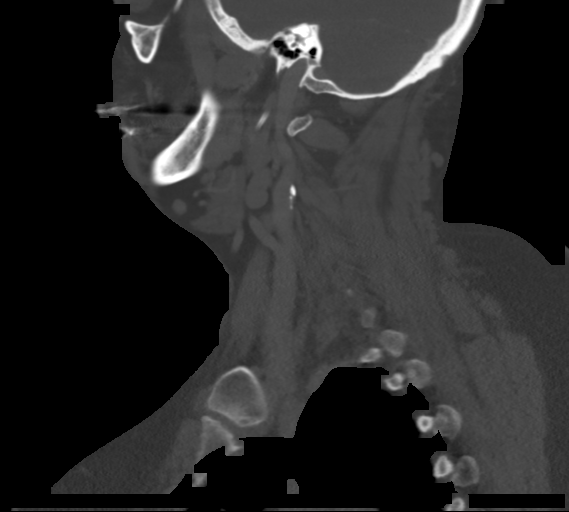
[im 43/103  bone]
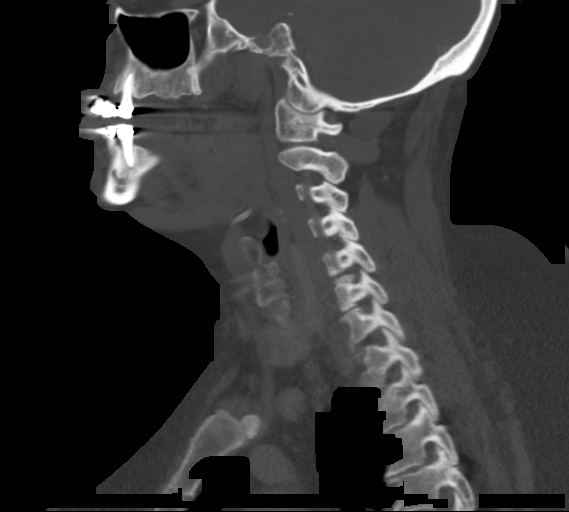
[im 52/103  soft-tissue]
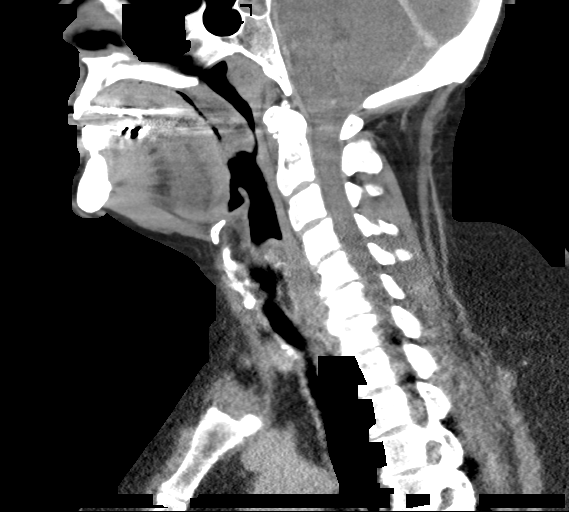
[im 52/103  bone]
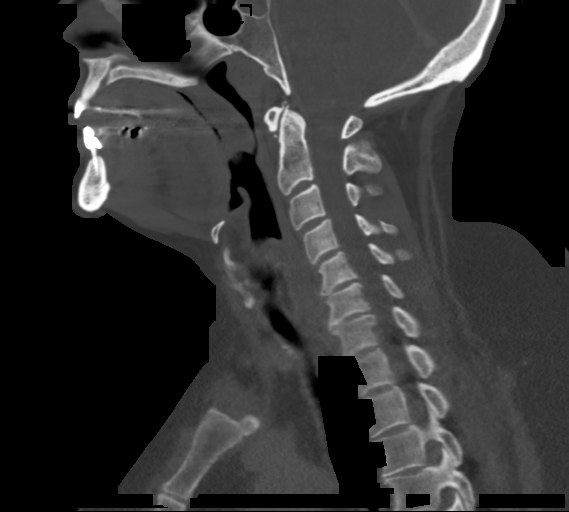
[im 60/103  bone]
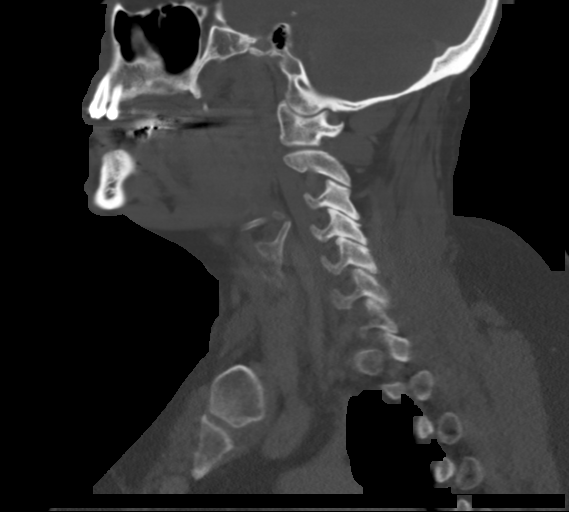
[im 69/103  bone]
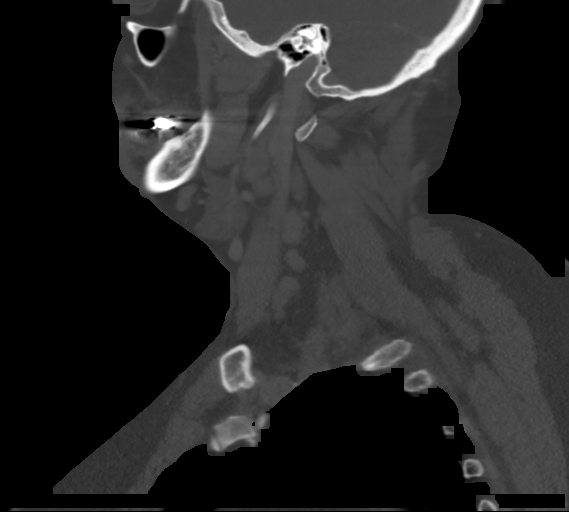

[Series 8: orthogonal ax · axial · 0.50mm/px · z∈[-236,-98]mm · 4 of 115 slices shown]
[im 23/115  bone]
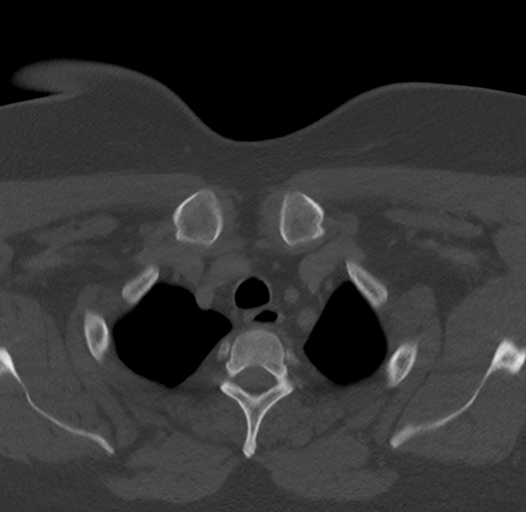
[im 46/115  bone]
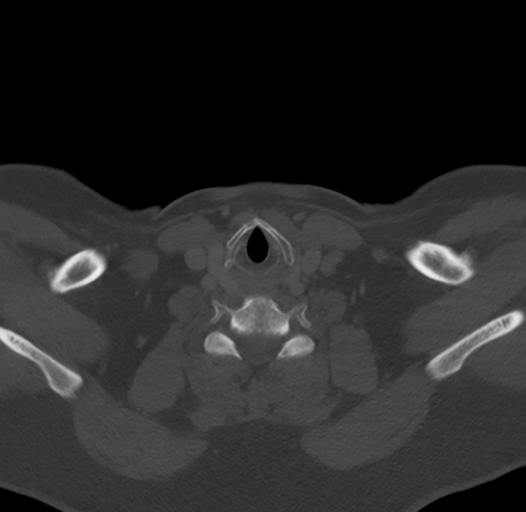
[im 69/115  bone]
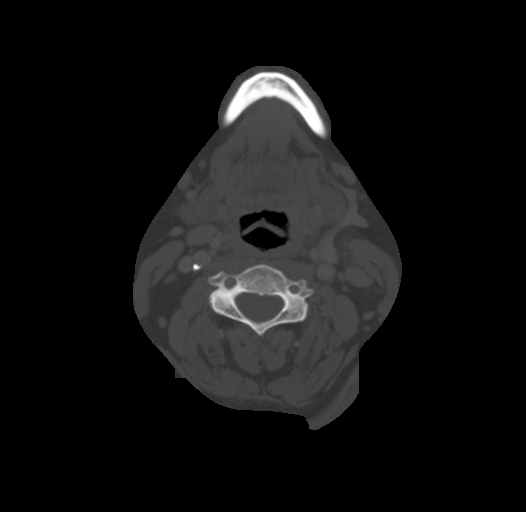
[im 92/115  bone]
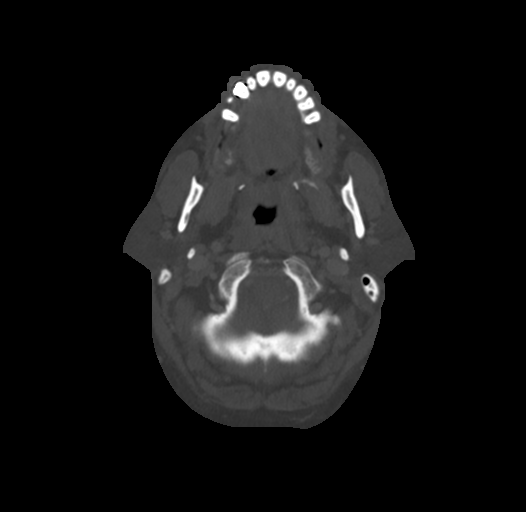

[16 of 33 positions shown; findings below may reference images not displayed]

FINDINGS: Pharynx and larynx: Asymmetric enlargement of the left tonsil with
solid enhancement. This measures 2 x 2 cm and could represent
infection or tumor. No abscess. Mild enlargement of the right tonsil
and adenoid tissue bilaterally consistent with pharyngitis.
Epiglottis and larynx normal

Salivary glands: No inflammation, mass, or stone.

Thyroid: Normal.

Lymph nodes: Prominent lymph nodes in the neck bilaterally left
greater than right. Right level 2 lymph node 11 mm. Multiple
subcentimeter posterior level 5 lymph nodes on the right.

Left level 2 lymph nodes measuring 11 mm, 10 mm. Multiple posterior
lymph nodes are present level 5, up to 12 mm. No nodal necrosis.

Vascular: Mild atherosclerotic calcification bilaterally.

Limited intracranial: Negative

Visualized orbits: Negative

Mastoids and visualized paranasal sinuses: Negative

Skeleton: Disc degeneration and spondylosis C5-6 and C6-7. No acute
skeletal abnormality.

Upper chest: Negative

Other: None
IMPRESSION: Hypertrophied lymph tissue in the pharynx compatible with
pharyngitis. Negative for abscess. Asymmetric enlargement left
tonsil likely due to pharyngitis however tumor not excluded.
Recommend close follow-up to ensure clearing

Bilateral prominent lymph nodes likely reactive. Lymphoma in the
differential. Close follow-up warranted.

These results will be called to the ordering clinician or
representative by the Radiologist Assistant, and communication
documented in the PACS or zVision Dashboard.

## 2019-06-18 ENCOUNTER — Encounter: Payer: Self-pay | Admitting: Family Medicine

## 2019-06-18 ENCOUNTER — Ambulatory Visit (INDEPENDENT_AMBULATORY_CARE_PROVIDER_SITE_OTHER): Payer: BC Managed Care – PPO | Admitting: Family Medicine

## 2019-06-18 ENCOUNTER — Other Ambulatory Visit: Payer: Self-pay

## 2019-06-18 VITALS — BP 134/70 | HR 76 | Temp 98.5°F | Resp 14 | Ht 67.0 in | Wt 242.0 lb

## 2019-06-18 DIAGNOSIS — I10 Essential (primary) hypertension: Secondary | ICD-10-CM

## 2019-06-18 DIAGNOSIS — E1149 Type 2 diabetes mellitus with other diabetic neurological complication: Secondary | ICD-10-CM

## 2019-06-18 DIAGNOSIS — E781 Pure hyperglyceridemia: Secondary | ICD-10-CM

## 2019-06-18 DIAGNOSIS — L0231 Cutaneous abscess of buttock: Secondary | ICD-10-CM

## 2019-06-18 DIAGNOSIS — Z6837 Body mass index (BMI) 37.0-37.9, adult: Secondary | ICD-10-CM

## 2019-06-18 DIAGNOSIS — G629 Polyneuropathy, unspecified: Secondary | ICD-10-CM | POA: Diagnosis not present

## 2019-06-18 MED ORDER — ALPRAZOLAM 1 MG PO TABS: 1.0000 mg | ORAL_TABLET | Freq: Every evening | ORAL | 0 refills | Status: DC | PRN

## 2019-06-18 MED ORDER — LOSARTAN POTASSIUM 25 MG PO TABS: 25.0000 mg | ORAL_TABLET | Freq: Every day | ORAL | 2 refills | Status: DC

## 2019-06-18 MED ORDER — GABAPENTIN 300 MG PO CAPS: ORAL_CAPSULE | ORAL | 3 refills | Status: DC

## 2019-06-18 MED ORDER — METFORMIN HCL 1000 MG PO TABS: ORAL_TABLET | ORAL | 2 refills | Status: DC

## 2019-06-18 MED ORDER — ATORVASTATIN CALCIUM 20 MG PO TABS: 20.0000 mg | ORAL_TABLET | Freq: Every day | ORAL | 3 refills | Status: DC

## 2019-06-18 NOTE — Assessment & Plan Note (Signed)
Discussed cutting back on junk food, sugary snacks, eating sugary cereal before bed

## 2019-06-18 NOTE — Patient Instructions (Addendum)
F/U 4 months for Physical  

## 2019-06-18 NOTE — Assessment & Plan Note (Signed)
Blood pressure is controlled no change in medication. 

## 2019-06-18 NOTE — Assessment & Plan Note (Signed)
Diabetes was controlled with A1c 6.6% however she admits to indulging more sweets that she has not been checking her CBG.  Goal is to keep it less than 7%.  She will continue her Metformin we will recheck her labs today along with renal function.  She is on statin drug Lipitor without any side effects.  We will obtain her last eye doctor visit.

## 2019-06-18 NOTE — Progress Notes (Signed)
   Subjective:    Patient ID: Alexandra Christensen, female    DOB: 19-Dec-1970, 49 y.o.   MRN: [id number]  Patient presents for Follow-up (is fasting)   Pt here to f/u chronic medical problems     Continues to have recurrent exacerbations of a gluteal abscess.  She has had this treated multiple times but does not seem to completely heal.  She would like to be evaluated by general surgery talk about next steps.  She continues to use Epson salt soaks and she has to wear a liner in case she does have some drainage.  It is not flared up right now.   DM- she has not been checking sugars recently, Last  6.6% in December    Eating more sweets,    Taking metformin    Hyperlipidemia- taking lipitor 40mg      She was seen by Dentist, oral lesion benign   [facility]    COVID-19 Vaccine she declines      Review Of Systems:  GEN- denies fatigue, fever, weight loss,weakness, recent illness HEENT- denies eye drainage, change in vision, nasal discharge, CVS- denies chest pain, palpitations RESP- denies SOB, cough, wheeze ABD- denies N/V, change in stools, abd pain GU- denies dysuria, hematuria, dribbling, incontinence MSK- denies joint pain, muscle aches, injury Neuro- denies headache, dizziness, syncope, seizure activity       Objective:    BP 134/70   Pulse 76   Temp 98.5 F (36.9 C) (Temporal)   Resp 14   Ht 5\' 7"  (1.702 m)   Wt 242 lb (109.8 kg)   SpO2 98%   BMI 37.90 kg/m  GEN- NAD, alert and oriented x3 HEENT- PERRL, EOMI, non injected sclera, pink conjunctiva, MMM, oropharynx clear  Neck- Supple, no thyromegaly CVS- RRR, no murmur RESP-CTAB ABD-NABS,soft,NT,ND EXT- No edema Pulses- Radial, DP- 2+        Assessment & Plan:      Problem List Items Addressed This Visit      Unprioritized   Essential hypertension - Primary    Blood pressure is controlled no change in medication.      Relevant Orders   CBC with Differential/Platelet   Comprehensive metabolic panel   Hypertriglyceridemia   Relevant Orders   Lipid panel   Neuropathy    Continue gabapentin, controlling symptoms       Obesity    Discussed cutting back on junk food, sugary snacks, eating sugary cereal before bed      Type 2 diabetes mellitus with neurological complications (HCC)    Diabetes was controlled with A1c 6.6% however she admits to indulging more sweets that she has not been checking her CBG.  Goal is to keep it less than 7%.  She will continue her Metformin we will recheck her labs today along with renal function.  She is on statin drug Lipitor without any side effects.  We will obtain her last eye doctor visit.      Relevant Orders   Comprehensive metabolic panel   Hemoglobin A1c    Other Visit Diagnoses    Gluteal abscess       recurrent abscess, may have deeper lesion or cystic component that needs to be removed    Relevant Orders   Ambulatory referral to General Surgery      Note: This dictation was prepared with Dragon dictation along with smaller phrase technology. Any transcriptional errors that result from this process are unintentional.

## 2019-06-18 NOTE — Assessment & Plan Note (Signed)
Continue gabapentin, controlling symptoms

## 2019-06-19 LAB — CBC WITH DIFFERENTIAL/PLATELET
Absolute Monocytes: 632 cells/uL (ref 200–950)
Basophils Absolute: 90 cells/uL (ref 0–200)
Basophils Relative: 0.7 %
Eosinophils Absolute: 684 cells/uL — ABNORMAL HIGH (ref 15–500)
Eosinophils Relative: 5.3 %
HCT: 44.3 % (ref 35.0–45.0)
Hemoglobin: 14.7 g/dL (ref 11.7–15.5)
Lymphs Abs: 3354 cells/uL (ref 850–3900)
MCH: 30.2 pg (ref 27.0–33.0)
MCHC: 33.2 g/dL (ref 32.0–36.0)
MCV: 91.2 fL (ref 80.0–100.0)
MPV: 12 fL (ref 7.5–12.5)
Monocytes Relative: 4.9 %
Neutro Abs: 8140 cells/uL — ABNORMAL HIGH (ref 1500–7800)
Neutrophils Relative %: 63.1 %
Platelets: 330 10*3/uL (ref 140–400)
RBC: 4.86 10*6/uL (ref 3.80–5.10)
RDW: 13.1 % (ref 11.0–15.0)
Total Lymphocyte: 26 %
WBC: 12.9 10*3/uL — ABNORMAL HIGH (ref 3.8–10.8)

## 2019-06-19 LAB — COMPREHENSIVE METABOLIC PANEL
AG Ratio: 1.5 (calc) (ref 1.0–2.5)
ALT: 18 U/L (ref 6–29)
AST: 15 U/L (ref 10–35)
Albumin: 4 g/dL (ref 3.6–5.1)
Alkaline phosphatase (APISO): 87 U/L (ref 37–153)
BUN: 15 mg/dL (ref 7–25)
CO2: 23 mmol/L (ref 20–32)
Calcium: 9.8 mg/dL (ref 8.6–10.4)
Chloride: 105 mmol/L (ref 98–110)
Creat: 1.01 mg/dL (ref 0.50–1.05)
Globulin: 2.6 g/dL (calc) (ref 1.9–3.7)
Glucose, Bld: 117 mg/dL — ABNORMAL HIGH (ref 65–99)
Potassium: 4.9 mmol/L (ref 3.5–5.3)
Sodium: 139 mmol/L (ref 135–146)
Total Bilirubin: 0.4 mg/dL (ref 0.2–1.2)
Total Protein: 6.6 g/dL (ref 6.1–8.1)

## 2019-06-19 LAB — LIPID PANEL
Cholesterol: 138 mg/dL (ref <200)
HDL: 43 mg/dL — ABNORMAL LOW (ref >=50)
LDL Cholesterol (Calc): 68 mg/dL (calc)
Non-HDL Cholesterol (Calc): 95 mg/dL (calc) (ref <130)
Total CHOL/HDL Ratio: 3.2 (calc) (ref <5.0)
Triglycerides: 205 mg/dL — ABNORMAL HIGH (ref <150)

## 2019-06-19 LAB — HEMOGLOBIN A1C
Hgb A1c MFr Bld: 6.5 % of total Hgb — ABNORMAL HIGH (ref <5.7)
Mean Plasma Glucose: 140 (calc)
eAG (mmol/L): 7.7 (calc)

## 2019-06-21 ENCOUNTER — Encounter: Payer: Self-pay | Admitting: Family Medicine

## 2019-06-21 DIAGNOSIS — D72829 Elevated white blood cell count, unspecified: Secondary | ICD-10-CM | POA: Insufficient documentation

## 2019-08-31 ENCOUNTER — Telehealth: Payer: Self-pay | Admitting: Family Medicine

## 2019-08-31 NOTE — Telephone Encounter (Signed)
Dr Jeanice Lim sent for a referral back in April and she has called twice and still not heard anything from them. [facility]?

## 2019-09-13 ENCOUNTER — Other Ambulatory Visit: Payer: Self-pay | Admitting: Student

## 2019-09-13 DIAGNOSIS — K603 Anal fistula: Secondary | ICD-10-CM

## 2019-09-14 ENCOUNTER — Encounter: Payer: Self-pay | Admitting: Family Medicine

## 2019-09-14 ENCOUNTER — Other Ambulatory Visit: Payer: Self-pay

## 2019-09-14 ENCOUNTER — Ambulatory Visit: Payer: BC Managed Care – PPO | Admitting: Family Medicine

## 2019-09-14 VITALS — BP 138/76 | HR 81 | Temp 98.9°F | Ht 67.0 in | Wt 248.0 lb

## 2019-09-14 DIAGNOSIS — L089 Local infection of the skin and subcutaneous tissue, unspecified: Secondary | ICD-10-CM

## 2019-09-14 DIAGNOSIS — L723 Sebaceous cyst: Secondary | ICD-10-CM | POA: Diagnosis not present

## 2019-09-14 DIAGNOSIS — K612 Anorectal abscess: Secondary | ICD-10-CM

## 2019-09-14 MED ORDER — SULFAMETHOXAZOLE-TRIMETHOPRIM 800-160 MG PO TABS: 1.0000 | ORAL_TABLET | Freq: Two times a day (BID) | ORAL | 0 refills | Status: DC

## 2019-09-14 NOTE — Addendum Note (Signed)
Addended by: Roxy Cedar on: 09/14/2019 03:18 PM   Modules accepted: Orders

## 2019-09-14 NOTE — Progress Notes (Signed)
Subjective:    Patient ID: Alexandra Christensen, female    DOB: [birth year], 49 y.o.   MRN: [id number]  HPI Patient has what appears to be an inflamed infected sebaceous cyst on her right vulva.  She states that she has been on 2 separate rounds of Augmentin under the care of another provider however the skin over the right vulva is tense erythematous and painful.  The underlying tissue is fluctuant.  There is a central brown pore that appears to be the opening to a sebaceous cyst.  Patient moans in pain with gentle palpation of the inflamed area.  It has been present for almost 10 days Past Medical History:  Diagnosis Date   Diabetes mellitus without complication ([place])    Hyperlipidemia    Hypertension    Past Surgical History:  Procedure Laterality Date   TEE WITHOUT CARDIOVERSION N/A 06/16/2013   Procedure: TRANSESOPHAGEAL ECHOCARDIOGRAM (TEE);  Surgeon: Pixie Casino, MD;  Location: [facility] ENDOSCOPY;  Service: Cardiovascular;  Laterality: N/A;   Current Outpatient Medications on File Prior to Visit  Medication Sig Dispense Refill   ALPRAZolam (XANAX) 1 MG tablet Take 1 tablet (1 mg total) by mouth at bedtime as needed. for sleep 30 tablet 0   amoxicillin-clavulanate (AUGMENTIN) 875-125 MG tablet Take 1 tablet by mouth 2 (two) times daily.     atorvastatin (LIPITOR) 20 MG tablet Take 1 tablet (20 mg total) by mouth daily. 90 tablet 3   Blood Glucose Monitoring Suppl (BLOOD GLUCOSE SYSTEM PAK) KIT Dispense based on patient and insurance preference. Use to monitor FSBS 1x daily. Dx: E11.9 1 each 1   gabapentin (NEURONTIN) 300 MG capsule TAKE 1 CAPSULE BY MOUTH IN THE MORNING AND 2 AT BEDTIME 270 capsule 3   glucose blood (ONE TOUCH ULTRA TEST) test strip USE TO CHECK FASTING BLOOD GLUCOSE ONCE DAILY 50 each 3   ibuprofen (ADVIL) 800 MG tablet Take 1 tablet (800 mg total) by mouth every 8 (eight) hours as needed. 90 tablet 1   Lancet Devices MISC Dispense based on patient and  insurance preference. Use to monitor FSBS 1x daily. Dx: E11.9 1 each 1   Lancets MISC Dispense based on patient and insurance preference. Use to monitor FSBS 1x daily. Dx: E11.9 100 each 1   losartan (COZAAR) 25 MG tablet Take 1 tablet (25 mg total) by mouth daily. 90 tablet 2   metFORMIN (GLUCOPHAGE) 1000 MG tablet TAKE 1 TABLET BY MOUTH TWICE DAILY WITH A MEAL. 180 tablet 2   No current facility-administered medications on file prior to visit.   No Known Allergies  Social History   Socioeconomic History   Marital status: Single    Spouse name: Not on file   Number of children: Not on file   Years of education: Not on file   Highest education level: Not on file  Occupational History   Not on file  Tobacco Use   Smoking status: Current Every Day Smoker    Packs/day: 0.50    Types: Cigarettes    Last attempt to quit: 03/21/2017    Years since quitting: 2.4   Smokeless tobacco: Never Used  Substance and Sexual Activity   Alcohol use: Yes    Alcohol/week: 1.0 - 2.0 standard drink    Types: 1 - 2 Glasses of wine per week    Comment: occasionally   Drug use: No   Sexual activity: Yes  Other Topics Concern   Not on file  Social History Narrative  Not on file   Social Determinants of Health   Financial Resource Strain:    Difficulty of Paying Living Expenses:   Food Insecurity:    Worried About Charity fundraiser in the Last Year:    Arboriculturist in the Last Year:   Transportation Needs:    Film/video editor (Medical):    Lack of Transportation (Non-Medical):   Physical Activity:    Days of Exercise per Week:    Minutes of Exercise per Session:   Stress:    Feeling of Stress :   Social Connections:    Frequency of Communication with Friends and Family:    Frequency of Social Gatherings with Friends and Family:    Attends Religious Services:    Active Member of Clubs or Organizations:    Attends Music therapist:     Marital Status:   Intimate Partner Violence:    Fear of Current or Ex-Partner:    Emotionally Abused:    Physically Abused:    Sexually Abused:    Family History  Problem Relation Age of Onset   Diabetes Mother    Heart disease Mother    Kidney disease Father    Heart disease Father    Stroke Sister 53   Cancer Sister        AML      Review of Systems  All other systems reviewed and are negative.      Objective:   Physical Exam Vitals reviewed.  Cardiovascular:     Rate and Rhythm: Normal rate and regular rhythm.     Heart sounds: Normal heart sounds.  Pulmonary:     Effort: Pulmonary effort is normal. No respiratory distress.     Breath sounds: Normal breath sounds. No stridor. No wheezing or rales.  Abdominal:     General: Bowel sounds are normal. There is no distension.     Palpations: Abdomen is soft. There is no mass.     Tenderness: There is no abdominal tenderness. There is no guarding or rebound.  Genitourinary:            Assessment & Plan:  Infected sebaceous cyst  The skin over the cyst was anesthetized with 0.1% lidocaine with epinephrine.  A chaperone was present for all portions of the exam and the procedure.  A 1 cm vertical incision was made in the center of the cyst.  Copious bloody drainage was expressed from the the incision site.  The incision site was then probed and opened with a pair of hemostats to break up any loculations.  The wound was then probed to a depth of approximately 1-1/2 inches with several Q-tip soaked in hydrogen peroxide.  No residual purulent material was expressed.  The wound cavity was then packed with 6 inches of iodoform gauze.  Wound care was discussed.  We will start the patient on Bactrim to cover MRSA just in case.  Wound culture was sent

## 2019-09-17 LAB — WOUND CULTURE
MICRO NUMBER:: 10754668
SPECIMEN QUALITY:: ADEQUATE

## 2019-10-20 ENCOUNTER — Ambulatory Visit: Payer: Self-pay | Admitting: Surgery

## 2019-10-20 NOTE — H&P (Signed)
CC: Referred for evaluation of possible anal fistula  HPI: Alexandra Christensen is a very pleasant 49yoF with hx of DM, HTN and one year ago developed swelling on her left buttock she reports the size of a baseball and was drained. She has since had intermittent drainage from this location. There last week or so she's also developed swelling on the right side of her buttock. She states she was seen by her PCP, Dr. Rubin Payor, and performed a incision and drainage but she reports he only got a small amount of pus but some blood out. She has since had continued swelling on her right buttock. She reports this feels like a large ball that she is sitting on. She has been taking Bactrim last week but finished her course yesterday. She denies fevers. She has had some chills. She denies nausea/vomiting/abdominal pain. She reports that [place] colonoscopy approximately 8 years ago with Dr. Elnoria Howard and she is unsure what the findings were.  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 We discussed proceeding with incision and drainage of the abscess. The planned procedure, material risks, benefits and alternatives were discussed. We verified that she had no known allergies. After discussing everything in welcoming her questions, she elected to proceed. Consent was obtained. The gluteal area was prepped with Betadine. Since then infiltrated with quarter  percent Marcaine with epinephrine. After achieving adequate level of local anesthesia, a cruciate incision is created in the perianal region and the skin edges were trimmed. This was done to facilitate drainage. The abscess cavity was bluntly explored and all loculations were broken free. Approximately 100 cc of green colored purulent fluid drained. No other loculations were present. Hemostasis is verified. The area was irrigated. This was then packed with one piece of 1 inch iodoform gauze. A dressing consisting of 4 x 4, ABD was placed. We discussed wound care expectations going forward and plans to remove packing tomorrow. She is offered a nurse visit to have this done but has stated that she has done this before and is comfortable doing so on her home. She stated she'll let us know if she had questions or concerns. She tolerated the procedure quite well.  The patient is a 49 year old female.   Allergies [facility], RMA; 09/21/2019 8:55 AM) No Known Drug Allergies  [09/03/2019]: Allergies Reconciled   Medication History [facility], RMA; 09/21/2019 8:55 AM) ALPRAZolam (1MG  Tablet, Oral) Active. Atorvastatin Calcium (20MG  Tablet, Oral) Active. Gabapentin (300MG  Capsule, Oral) Active. Losartan Potassium (25MG  Tablet, Oral) Active. metFORMIN HCl (1000MG  Tablet, Oral) Active. Ibuprofen (800MG  Tablet, Oral) Active. Medications Reconciled    Review of Systems M. Shamone Winzer MD; 09/21/2019 9:34 AM) General Present- Night Sweats. Not Present- Appetite Loss, Chills, Fatigue, Fever, Weight Gain and Weight Loss. Skin Present- New Lesions. Not Present- Change in Wart/Mole, Dryness, Hives, Jaundice, Non-Healing Wounds, Rash and Ulcer. HEENT Present- Seasonal Allergies and Wears glasses/contact lenses. Not Present- Earache, Hearing Loss, Hoarseness, Nose Bleed, Oral Ulcers, Ringing in  the Ears, Sinus Pain, Sore Throat, Visual Disturbances and Yellow  Eyes. Respiratory Not Present- Cough and Decreased Exercise Tolerance. Cardiovascular Not Present- Chest Pain and Difficulty Breathing On Exertion. Gastrointestinal Present- Rectal Pain. Not Present- Abdominal Pain and Nausea. Musculoskeletal Not Present- Back Pain and Decreased Range of Motion. Psychiatric Not Present- Anxiety and Depression. Hematology Not Present- Abnormal Bleeding, Blood Clots and Blood Thinners.  Vitals Alexandra Christensen RMA; 09/21/2019 8:55 AM) 09/21/2019 8:55 AM Weight: 232.8 lb Height: 67in Body Surface Area: 2.16 m Body Mass Index: 36.46 kg/m  Temp.: 97.35F (Temporal)  Pulse: 89 (Regular)  P.OX: 97% (Room air) BP: 128/70(Sitting, Left Arm, Standard)       Physical Exam Cristal Deer M. Tabor Bartram MD; 09/21/2019 9:40 AM) The physical exam findings are as follows: Note: Constitutional: No acute distress; conversant; no deformities; wearing mask Eyes: Moist conjunctiva; no lid lag; anicteric sclerae; pupils equal round and reactive to light Neck: Trachea midline; no palpable thyromegaly Lungs: Normal respiratory effort; no tactile fremitus CV: rrr; no palpable thrill; no pitting edema GI: Abdomen soft, nontender, nondistended; no palpable hepatosplenomegaly Anorectal: Left sided anterolateral external opening with palpable tract toward anus, open. Right anterior perianal abscess with some erythema. DRE - normal tone, no palpable masses Anoscopy: Circumferential anoscopy somewhat limited due to patient intolerance but no obvious fissures or obvious abnormalities noted. MSK: Normal gait; no clubbing/cyanosis Psychiatric: Appropriate affect; alert and oriented 3 Lymphatic: No palpable cervical or axillary lymphadenopathy **A chaperone, Alexandra Christensen, was present for this encounter    Assessment & Plan Cristal Deer M. Garmon Dehn MD; 09/21/2019 9:45 AM) ANAL FISTULA (K60.3) Story: Ms. Loughridge is a very pleasant 49yoF with hx of HTN, DM - left sided anal  fistula and now right sided perianal abscess Impression: -The anatomy and physiology of the anal canal was discussed at length with the patient. The pathophysiology of anal fistula and abscess was discussed at length with associated pictures and illustrations. -We discussed proceeding with incision and drainage of the right perianal abscess. We then discussed proceeding to OR in ~1-2 months for anorectal exam under anesthesia, possible incision/drainage, possible seton, possible fistulotomy - all based on intraoperative findings and type of fistula(s) present. We discussed seton as a bridge to subsequent surgeries as well. -The planned procedures, material risks (including, but not limited to, pain, bleeding, infection, scarring, need for blood transfusion, damage to anal sphincter, incontinence of gas and/or stool, need for additional procedures, recurrence, pneumonia, heart attack, stroke, death) benefits and alternatives to surgery were discussed at length. I noted a good probability that the procedure would help improve their symptoms. The patient's questions were answered to their satisfaction, they voiced understanding and elected to proceed with surgery. Additionally, we discussed typical postoperative expectations and the recovery process. -We also discussed that she will likely require multiple procedures to manage her fistula(s) and that it is not anticipated she will be done with everything after 1 surgery.  This patient encounter took 35 minutes today to perform the following: take history, perform exam, review outside records, interpret imaging, counsel the patient on their diagnosis and document encounter, findings & plan in the EHR Current Plans ANOSCOPY, DIAGNOSTIC [phone]) (Anoscopy: Circumferential anoscopy somewhat limited due to patient intolerance but no obvious fissures or obvious abnormalities noted.) I&D ABSCESS SKIN & SQ; COMPLICATED OR MULTIPLE (10061) Started Bactrim DS 800-160 MG  Oral Tablet, 1 (one) Tablet twice a day, #10, 5 days starting 09/21/2019, No Refill. Signed by Andria Meuse, MD (09/21/2019 9:46 AM)

## 2019-10-28 NOTE — Progress Notes (Addendum)
COVID Vaccine Completed:  No Date COVID Vaccine completed: COVID vaccine manufacturer: Pfizer    Moderna   [employer]   PCP - Milinda Antis, MD Cardiologist -   Chest x-ray -  EKG - 10-29-19 in Epic Stress Test -  ECHO -  Cardiac Cath -   Sleep Study -  CPAP -   Fasting Blood Sugar - 125 to 170s Checks Blood Sugar - once or twice a week  Blood Thinner Instructions: Aspirin Instructions: Last Dose:  Anesthesia review:   Patient denies shortness of breath, fever, cough and chest pain at PAT appointment   Patient verbalized understanding of instructions that were given to them at the PAT appointment. Patient was also instructed that they will need to review over the PAT instructions again at home before surgery.

## 2019-10-28 NOTE — Patient Instructions (Addendum)
DUE TO COVID-19 ONLY ONE VISITOR IS ALLOWED TO COME WITH YOU AND STAY IN THE WAITING ROOM ONLY DURING  PRE OP AND PROCEDURE.   IF YOU WILL BE ADMITTED INTO THE HOSPITAL YOU ARE ALLOWED ONE SUPPORT PERSON DURING VISITATION HOURS  ONLY (10AM -8PM)   . The support person may change daily. . The support person must pass our screening, gel in and out, and wear a mask at all times, including in the patient's room. . Patients must also wear a mask when staff or their support person are in the room.   COVID SWAB TESTING MUST BE COMPLETED ON:  Saturday, 10-30-19 @ 11:30    [address] [place], [place] [address]  (Must self quarantine after testing. Follow instructions on handout.)        Your procedure is scheduled on:  Wednesday, 11-03-19   Report to [facility] Main  Entrance   Report to admitting at 6:30 AM   Call this number if you have problems the morning of surgery [phone]    Follow prep per surgeon's office: Fleets Enema the night before surgery       Fleets Enema the morning of surgery   Do not eat food :After Midnight.   May have liquids until 5:30 AM  day of surgery   CLEAR LIQUID DIET  Foods Allowed                                                                     Foods Excluded  Water, Black Coffee and tea, regular and decaf              liquids that you cannot  Plain Jell-O in any flavor  (No red)                                     see through such as: Fruit ices (not with fruit pulp)                                      milk, soups, orange juice              Iced Popsicles (No red)                                      All solid food                                   Apple juices Sports drinks like Gatorade (No red) Lightly seasoned clear broth or consume(fat free) Sugar, honey syrup   Oral Hygiene is also important to reduce your risk of infection.                                    Remember - BRUSH YOUR TEETH THE MORNING OF SURGERY WITH YOUR REGULAR  TOOTHPASTE   Do NOT  smoke after Midnight   Take these medicines the morning of surgery with A SIP OF WATER:  Atorvastatin, Gabapentin                    How to Manage Your Diabetes Before and After Surgery  Why is it important to control my blood sugar before and after surgery? . Improving blood sugar levels before and after surgery helps healing and can limit problems. . A way of improving blood sugar control is eating a healthy diet by: o  Eating less sugar and carbohydrates o  Increasing activity/exercise o  Talking with your doctor about reaching your blood sugar goals . High blood sugars (greater than 180 mg/dL) can raise your risk of infections and slow your recovery, so you will need to focus on controlling your diabetes during the weeks before surgery. . Make sure that the doctor who takes care of your diabetes knows about your planned surgery including the date and location.  How do I manage my blood sugar before surgery? . Check your blood sugar at least 4 times a day, starting 2 days before surgery, to make sure that the level is not too high or low. o Check your blood sugar the morning of your surgery when you wake up and every 2 hours until you get to the Short Stay unit. . If your blood sugar is less than 70 mg/dL, you will need to treat for low blood sugar: o Do not take insulin. o Treat a low blood sugar (less than 70 mg/dL) with  cup of clear juice (cranberry or apple), 4 glucose tablets, OR glucose gel. o Recheck blood sugar in 15 minutes after treatment (to make sure it is greater than 70 mg/dL). If your blood sugar is not greater than 70 mg/dL on recheck, call [phone] for further instructions. . Report your blood sugar to the short stay nurse when you get to Short Stay.  . If you are admitted to the hospital after surgery: o Your blood sugar will be checked by the staff and you will probably be given insulin after surgery (instead of oral diabetes medicines) to  make sure you have good blood sugar levels. o The goal for blood sugar control after surgery is 80-180 mg/dL.   WHAT DO I DO ABOUT MY DIABETES MEDICATION?  Marland Kitchen Do not take oral diabetes medicines (pills) the morning of surgery.  . THE DAY BEFORE SURGERY:  Take Metformin as prescribed.       . THE MORNING OF SURGERY: Do not take Metformin.   Reviewed and Endorsed by [facility] Patient Education Committee, August 2015              You may not have any metal on your body including hair pins, jewelry, and body piercings             Do not wear make-up, lotions, powders, perfumes/cologne, or deodorant             Do not wear nail polish.  Do not shave  48 hours prior to surgery.                Do not bring valuables to the hospital. [facility] IS NOT RESPONSIBLE   FOR VALUABLES.   Contacts, dentures or bridgework may not be worn into surgery.    Patients discharged the day of surgery will not be allowed to drive home.  Please read over the following fact sheets you were given: IF YOU HAVE QUESTIONS ABOUT YOUR PRE OP INSTRUCTIONS  PLEASE CALL [phone]   [facility] - Preparing for Surgery Before surgery, you can play an important role.  Because skin is not sterile, your skin needs to be as free of germs as possible.  You can reduce the number of germs on your skin by washing with CHG (chlorahexidine gluconate) soap before surgery.  CHG is an antiseptic cleaner which kills germs and bonds with the skin to continue killing germs even after washing. Please DO NOT use if you have an allergy to CHG or antibacterial soaps.  If your skin becomes reddened/irritated stop using the CHG and inform your nurse when you arrive at Short Stay. Do not shave (including legs and underarms) for at least 48 hours prior to the first CHG shower.  You may shave your face/neck.  Please follow these instructions carefully:  1.  Shower with CHG Soap the night before surgery and the  morning of  surgery.  2.  If you choose to wash your hair, wash your hair first as usual with your normal  shampoo.  3.  After you shampoo, rinse your hair and body thoroughly to remove the shampoo.                             4.  Use CHG as you would any other liquid soap.  You can apply chg directly to the skin and wash.  Gently with a scrungie or clean washcloth.  5.  Apply the CHG Soap to your body ONLY FROM THE NECK DOWN.   Do   not use on face/ open                           Wound or open sores. Avoid contact with eyes, ears mouth and   genitals (private parts).                       Wash face,  Genitals (private parts) with your normal soap.             6.  Wash thoroughly, paying special attention to the area where your    surgery  will be performed.  7.  Thoroughly rinse your body with warm water from the neck down.  8.  DO NOT shower/wash with your normal soap after using and rinsing off the CHG Soap.                9.  Pat yourself dry with a clean towel.            10.  Wear clean pajamas.            11.  Place clean sheets on your bed the night of your first shower and do not  sleep with pets. Day of Surgery : Do not apply any lotions/deodorants the morning of surgery.  Please wear clean clothes to the hospital/surgery center.  FAILURE TO FOLLOW THESE INSTRUCTIONS MAY RESULT IN THE CANCELLATION OF YOUR SURGERY  PATIENT SIGNATURE_________________________________  NURSE SIGNATURE__________________________________  ________________________________________________________________________

## 2019-10-29 ENCOUNTER — Other Ambulatory Visit: Payer: Self-pay

## 2019-10-29 ENCOUNTER — Encounter (HOSPITAL_COMMUNITY)
Admission: RE | Admit: 2019-10-29 | Discharge: 2019-10-29 | Disposition: A | Discharge status: Home / Self Care | Payer: BC Managed Care – PPO | Source: Ambulatory Visit | Attending: Surgery | Admitting: Surgery

## 2019-10-29 ENCOUNTER — Encounter (HOSPITAL_COMMUNITY): Payer: Self-pay

## 2019-10-29 DIAGNOSIS — I10 Essential (primary) hypertension: Secondary | ICD-10-CM | POA: Diagnosis not present

## 2019-10-29 DIAGNOSIS — Z01818 Encounter for other preprocedural examination: Secondary | ICD-10-CM | POA: Diagnosis not present

## 2019-10-29 HISTORY — DX: Noninfective gastroenteritis and colitis, unspecified: K52.9

## 2019-10-29 HISTORY — DX: Headache, unspecified: R51.9

## 2019-10-29 LAB — CBC WITH DIFFERENTIAL/PLATELET
Abs Immature Granulocytes: 0.02 10*3/uL (ref 0.00–0.07)
Basophils Absolute: 0.1 10*3/uL (ref 0.0–0.1)
Basophils Relative: 1 %
Eosinophils Absolute: 0.5 10*3/uL (ref 0.0–0.5)
Eosinophils Relative: 4 %
HCT: 45.5 % (ref 36.0–46.0)
Hemoglobin: 15.1 g/dL — ABNORMAL HIGH (ref 12.0–15.0)
Immature Granulocytes: 0 %
Lymphocytes Relative: 29 %
Lymphs Abs: 3.3 10*3/uL (ref 0.7–4.0)
MCH: 30 pg (ref 26.0–34.0)
MCHC: 33.2 g/dL (ref 30.0–36.0)
MCV: 90.5 fL (ref 80.0–100.0)
Monocytes Absolute: 0.4 10*3/uL (ref 0.1–1.0)
Monocytes Relative: 4 %
Neutro Abs: 7.1 10*3/uL (ref 1.7–7.7)
Neutrophils Relative %: 62 %
Platelets: 346 10*3/uL (ref 150–400)
RBC: 5.03 MIL/uL (ref 3.87–5.11)
RDW: 13.4 % (ref 11.5–15.5)
WBC: 11.3 10*3/uL — ABNORMAL HIGH (ref 4.0–10.5)
nRBC: 0 % (ref 0.0–0.2)

## 2019-10-29 LAB — COMPREHENSIVE METABOLIC PANEL
ALT: 19 U/L (ref 0–44)
AST: 17 U/L (ref 15–41)
Albumin: 3.9 g/dL (ref 3.5–5.0)
Alkaline Phosphatase: 79 U/L (ref 38–126)
Anion gap: 13 (ref 5–15)
BUN: 13 mg/dL (ref 6–20)
CO2: 25 mmol/L (ref 22–32)
Calcium: 9.7 mg/dL (ref 8.9–10.3)
Chloride: 103 mmol/L (ref 98–111)
Creatinine, Ser: 0.96 mg/dL (ref 0.44–1.00)
GFR calc Af Amer: >60 mL/min (ref >60)
GFR calc non Af Amer: >60 mL/min (ref >60)
Glucose, Bld: 172 mg/dL — ABNORMAL HIGH (ref 70–99)
Potassium: 4.4 mmol/L (ref 3.5–5.1)
Sodium: 141 mmol/L (ref 135–145)
Total Bilirubin: 0.5 mg/dL (ref 0.3–1.2)
Total Protein: 7.4 g/dL (ref 6.5–8.1)

## 2019-10-29 LAB — HEMOGLOBIN A1C
Hgb A1c MFr Bld: 6.8 % — ABNORMAL HIGH (ref 4.8–5.6)
Mean Plasma Glucose: 148.46 mg/dL

## 2019-10-29 LAB — GLUCOSE, CAPILLARY: Glucose-Capillary: 170 mg/dL — ABNORMAL HIGH (ref 70–99)

## 2019-10-30 ENCOUNTER — Other Ambulatory Visit (HOSPITAL_COMMUNITY)
Admission: RE | Admit: 2019-10-30 | Discharge: 2019-10-30 | Disposition: A | Discharge status: Home / Self Care | Payer: BC Managed Care – PPO | Source: Ambulatory Visit | Attending: Surgery | Admitting: Surgery

## 2019-10-30 DIAGNOSIS — Z20822 Contact with and (suspected) exposure to covid-19: Secondary | ICD-10-CM | POA: Insufficient documentation

## 2019-10-30 DIAGNOSIS — Z01818 Encounter for other preprocedural examination: Secondary | ICD-10-CM | POA: Diagnosis present

## 2019-10-30 LAB — SARS CORONAVIRUS 2 (TAT 6-24 HRS): SARS Coronavirus 2: NEGATIVE

## 2019-11-02 ENCOUNTER — Encounter: Payer: BC Managed Care – PPO | Admitting: Family Medicine

## 2019-11-02 MED ORDER — BUPIVACAINE LIPOSOME 1.3 % IJ SUSP
20.0000 mL | Freq: Once | INTRAMUSCULAR | Status: DC
  Filled 2019-11-02: qty 20

## 2019-11-02 NOTE — Anesthesia Preprocedure Evaluation (Addendum)
Anesthesia Evaluation  Patient identified by MRN, date of birth, ID band Patient awake    Reviewed: Allergy & Precautions, NPO status , Patient's Chart, lab work & pertinent test results  Airway Mallampati: III  TM Distance: >3 FB Neck ROM: Full    Dental no notable dental hx. (+) Teeth Intact, Dental Advisory Given,    Pulmonary Current Smoker and Patient abstained from smoking.,  5-6cigg/d x 35 years No inhalers   Pulmonary exam normal breath sounds clear to auscultation       Cardiovascular hypertension, Pt. on medications Normal cardiovascular exam Rhythm:Regular Rate:Normal     Neuro/Psych  Headaches, PSYCHIATRIC DISORDERS Anxiety    GI/Hepatic Neg liver ROS, Anal fistula   Endo/Other  diabetes, Well Controlled, Type 2, Oral Hypoglycemic AgentsObesity BMI 37  Renal/GU negative Renal ROS  negative genitourinary   Musculoskeletal negative musculoskeletal ROS (+)   Abdominal (+) + obese,   Peds negative pediatric ROS (+)  Hematology negative hematology ROS (+)   Anesthesia Other Findings   Reproductive/Obstetrics negative OB ROS                            Anesthesia Physical Anesthesia Plan  ASA: III  Anesthesia Plan: General   Post-op Pain Management:    Induction: Intravenous  PONV Risk Score and Plan: 2 and Ondansetron, Dexamethasone, Midazolam and Treatment may vary due to age or medical condition  Airway Management Planned: Oral ETT  Additional Equipment: None  Intra-op Plan:   Post-operative Plan: Extubation in OR  Informed Consent: I have reviewed the patients History and Physical, chart, labs and discussed the procedure including the risks, benefits and alternatives for the proposed anesthesia with the patient or authorized representative who has indicated his/her understanding and acceptance.     Dental advisory given  Plan Discussed with: CRNA  Anesthesia  Plan Comments:        Anesthesia Quick Evaluation

## 2019-11-03 ENCOUNTER — Ambulatory Visit (HOSPITAL_COMMUNITY)
Admission: RE | Admit: 2019-11-03 | Discharge: 2019-11-03 | Disposition: A | Discharge status: Home / Self Care | Payer: BC Managed Care – PPO | Attending: Surgery | Admitting: Surgery

## 2019-11-03 ENCOUNTER — Other Ambulatory Visit: Payer: Self-pay | Admitting: Family Medicine

## 2019-11-03 ENCOUNTER — Ambulatory Visit (HOSPITAL_COMMUNITY): Payer: BC Managed Care – PPO | Admitting: Anesthesiology

## 2019-11-03 ENCOUNTER — Encounter (HOSPITAL_COMMUNITY)
Admission: RE | Disposition: A | Discharge status: Home / Self Care | Payer: Self-pay | Source: Home / Self Care | Attending: Surgery

## 2019-11-03 ENCOUNTER — Encounter (HOSPITAL_COMMUNITY): Payer: Self-pay | Admitting: Surgery

## 2019-11-03 DIAGNOSIS — K610 Anal abscess: Secondary | ICD-10-CM | POA: Diagnosis not present

## 2019-11-03 DIAGNOSIS — E119 Type 2 diabetes mellitus without complications: Secondary | ICD-10-CM | POA: Diagnosis not present

## 2019-11-03 DIAGNOSIS — K603 Anal fistula: Secondary | ICD-10-CM | POA: Diagnosis present

## 2019-11-03 DIAGNOSIS — F17210 Nicotine dependence, cigarettes, uncomplicated: Secondary | ICD-10-CM | POA: Diagnosis not present

## 2019-11-03 DIAGNOSIS — Z6837 Body mass index (BMI) 37.0-37.9, adult: Secondary | ICD-10-CM | POA: Diagnosis not present

## 2019-11-03 DIAGNOSIS — E669 Obesity, unspecified: Secondary | ICD-10-CM | POA: Diagnosis not present

## 2019-11-03 DIAGNOSIS — I10 Essential (primary) hypertension: Secondary | ICD-10-CM | POA: Insufficient documentation

## 2019-11-03 HISTORY — PX: ANAL FISTULOTOMY: SHX6423

## 2019-11-03 HISTORY — PX: RECTAL EXAM UNDER ANESTHESIA: SHX6399

## 2019-11-03 LAB — GLUCOSE, CAPILLARY
Glucose-Capillary: 136 mg/dL — ABNORMAL HIGH (ref 70–99)
Glucose-Capillary: 135 mg/dL — ABNORMAL HIGH (ref 70–99)

## 2019-11-03 SURGERY — EXAM UNDER ANESTHESIA, RECTUM
Anesthesia: General

## 2019-11-03 MED ORDER — PROMETHAZINE HCL 25 MG/ML IJ SOLN: 6.2500 mg | INTRAMUSCULAR | Status: DC | PRN

## 2019-11-03 MED ORDER — ACETAMINOPHEN 500 MG PO TABS: 1000.0000 mg | ORAL_TABLET | Freq: Once | ORAL | Status: DC

## 2019-11-03 MED ORDER — KETOROLAC TROMETHAMINE 30 MG/ML IJ SOLN: 30.0000 mg | Freq: Once | INTRAMUSCULAR | Status: DC | PRN

## 2019-11-03 MED ORDER — DEXAMETHASONE SODIUM PHOSPHATE 10 MG/ML IJ SOLN
INTRAMUSCULAR | Status: DC | PRN
  Administered 2019-11-03: 10 mg via INTRAVENOUS

## 2019-11-03 MED ORDER — FENTANYL CITRATE (PF) 100 MCG/2ML IJ SOLN
INTRAMUSCULAR | Status: AC
  Filled 2019-11-03: qty 2

## 2019-11-03 MED ORDER — CHLORHEXIDINE GLUCONATE CLOTH 2 % EX PADS: 6.0000 | MEDICATED_PAD | Freq: Once | CUTANEOUS | Status: DC

## 2019-11-03 MED ORDER — ACETAMINOPHEN 500 MG PO TABS
1000.0000 mg | ORAL_TABLET | ORAL | Status: AC
  Administered 2019-11-03: 1000 mg via ORAL
  Filled 2019-11-03: qty 2

## 2019-11-03 MED ORDER — PROPOFOL 10 MG/ML IV BOLUS
INTRAVENOUS | Status: AC
  Filled 2019-11-03: qty 20

## 2019-11-03 MED ORDER — ORAL CARE MOUTH RINSE: 15.0000 mL | Freq: Once | OROMUCOSAL | Status: AC

## 2019-11-03 MED ORDER — MIDAZOLAM HCL 2 MG/2ML IJ SOLN
INTRAMUSCULAR | Status: AC
  Filled 2019-11-03: qty 2

## 2019-11-03 MED ORDER — LACTATED RINGERS IV SOLN: INTRAVENOUS | Status: DC

## 2019-11-03 MED ORDER — ROCURONIUM BROMIDE 10 MG/ML (PF) SYRINGE
PREFILLED_SYRINGE | INTRAVENOUS | Status: DC | PRN
  Administered 2019-11-03: 60 mg via INTRAVENOUS

## 2019-11-03 MED ORDER — DEXAMETHASONE SODIUM PHOSPHATE 10 MG/ML IJ SOLN
INTRAMUSCULAR | Status: AC
  Filled 2019-11-03: qty 1

## 2019-11-03 MED ORDER — LACTATED RINGERS IV SOLN: INTRAVENOUS | Status: DC | PRN

## 2019-11-03 MED ORDER — LIDOCAINE 2% (20 MG/ML) 5 ML SYRINGE
INTRAMUSCULAR | Status: AC
  Filled 2019-11-03: qty 5

## 2019-11-03 MED ORDER — HYDROGEN PEROXIDE 3 % EX SOLN
CUTANEOUS | Status: AC
  Filled 2019-11-03: qty 473

## 2019-11-03 MED ORDER — PROPOFOL 10 MG/ML IV BOLUS
INTRAVENOUS | Status: DC | PRN
  Administered 2019-11-03: 150 mg via INTRAVENOUS

## 2019-11-03 MED ORDER — ROCURONIUM BROMIDE 10 MG/ML (PF) SYRINGE
PREFILLED_SYRINGE | INTRAVENOUS | Status: AC
  Filled 2019-11-03: qty 10

## 2019-11-03 MED ORDER — 0.9 % SODIUM CHLORIDE (POUR BTL) OPTIME
TOPICAL | Status: DC | PRN
  Administered 2019-11-03: 1000 mL

## 2019-11-03 MED ORDER — CHLORHEXIDINE GLUCONATE 0.12 % MT SOLN
15.0000 mL | Freq: Once | OROMUCOSAL | Status: AC
  Administered 2019-11-03: 15 mL via OROMUCOSAL

## 2019-11-03 MED ORDER — PHENYLEPHRINE 40 MCG/ML (10ML) SYRINGE FOR IV PUSH (FOR BLOOD PRESSURE SUPPORT)
PREFILLED_SYRINGE | INTRAVENOUS | Status: DC | PRN
  Administered 2019-11-03: 80 ug via INTRAVENOUS

## 2019-11-03 MED ORDER — BUPIVACAINE-EPINEPHRINE (PF) 0.25% -1:200000 IJ SOLN
INTRAMUSCULAR | Status: AC
  Filled 2019-11-03: qty 30

## 2019-11-03 MED ORDER — HYDROMORPHONE HCL 1 MG/ML IJ SOLN: 0.2500 mg | INTRAMUSCULAR | Status: DC | PRN

## 2019-11-03 MED ORDER — ONDANSETRON HCL 4 MG/2ML IJ SOLN
INTRAMUSCULAR | Status: DC | PRN
  Administered 2019-11-03: 4 mg via INTRAVENOUS

## 2019-11-03 MED ORDER — OXYCODONE HCL 5 MG/5ML PO SOLN: 5.0000 mg | Freq: Once | ORAL | Status: DC | PRN

## 2019-11-03 MED ORDER — SUGAMMADEX SODIUM 200 MG/2ML IV SOLN
INTRAVENOUS | Status: DC | PRN
  Administered 2019-11-03: 200 mg via INTRAVENOUS

## 2019-11-03 MED ORDER — TRAMADOL HCL 50 MG PO TABS: 50.0000 mg | ORAL_TABLET | Freq: Four times a day (QID) | ORAL | 0 refills | Status: AC | PRN

## 2019-11-03 MED ORDER — LIDOCAINE 2% (20 MG/ML) 5 ML SYRINGE
INTRAMUSCULAR | Status: DC | PRN
  Administered 2019-11-03: 100 mg via INTRAVENOUS

## 2019-11-03 MED ORDER — HYDROGEN PEROXIDE 3 % EX SOLN
CUTANEOUS | Status: DC | PRN
  Administered 2019-11-03: 1

## 2019-11-03 MED ORDER — MIDAZOLAM HCL 5 MG/5ML IJ SOLN
INTRAMUSCULAR | Status: DC | PRN
  Administered 2019-11-03: 2 mg via INTRAVENOUS

## 2019-11-03 MED ORDER — BUPIVACAINE-EPINEPHRINE (PF) 0.25% -1:200000 IJ SOLN
INTRAMUSCULAR | Status: DC | PRN
  Administered 2019-11-03: 20 mL

## 2019-11-03 MED ORDER — MEPERIDINE HCL 50 MG/ML IJ SOLN: 6.2500 mg | INTRAMUSCULAR | Status: DC | PRN

## 2019-11-03 MED ORDER — BUPIVACAINE LIPOSOME 1.3 % IJ SUSP
INTRAMUSCULAR | Status: DC | PRN
  Administered 2019-11-03: 20 mL

## 2019-11-03 MED ORDER — ONDANSETRON HCL 4 MG/2ML IJ SOLN
INTRAMUSCULAR | Status: AC
  Filled 2019-11-03: qty 2

## 2019-11-03 MED ORDER — OXYCODONE HCL 5 MG PO TABS: 5.0000 mg | ORAL_TABLET | Freq: Once | ORAL | Status: DC | PRN

## 2019-11-03 MED ORDER — FENTANYL CITRATE (PF) 250 MCG/5ML IJ SOLN
INTRAMUSCULAR | Status: DC | PRN
Start: 2019-11-03 — End: 2019-11-03
  Administered 2019-11-03: 50 ug via INTRAVENOUS
  Administered 2019-11-03: 100 ug via INTRAVENOUS
  Administered 2019-11-03: 50 ug via INTRAVENOUS

## 2019-11-03 SURGICAL SUPPLY — 27 items
BRIEF STRETCH FOR OB PAD LRG (UNDERPADS AND DIAPERS) ×3 IMPLANT
COVER WAND RF STERILE (DRAPES) IMPLANT
DECANTER SPIKE VIAL GLASS SM (MISCELLANEOUS) IMPLANT
DRSG PAD ABDOMINAL 8X10 ST (GAUZE/BANDAGES/DRESSINGS) ×3 IMPLANT
ELECT REM PT RETURN 15FT ADLT (MISCELLANEOUS) ×3 IMPLANT
GAUZE SPONGE 4X4 12PLY STRL (GAUZE/BANDAGES/DRESSINGS) ×3 IMPLANT
GLOVE BIO SURGEON STRL SZ7.5 (GLOVE) ×3 IMPLANT
GLOVE INDICATOR 8.0 STRL GRN (GLOVE) ×3 IMPLANT
GOWN STRL REUS W/TWL XL LVL3 (GOWN DISPOSABLE) ×6 IMPLANT
IV CATH 14GX2 1/4 (CATHETERS) ×3 IMPLANT
KIT BASIN OR (CUSTOM PROCEDURE TRAY) ×3 IMPLANT
KIT TURNOVER KIT A (KITS) IMPLANT
NEEDLE HYPO 22GX1.5 SAFETY (NEEDLE) ×3 IMPLANT
PACK GENERAL/GYN (CUSTOM PROCEDURE TRAY) ×3 IMPLANT
PENCIL SMOKE EVACUATOR (MISCELLANEOUS) IMPLANT
SHEARS HARMONIC 9CM CVD (BLADE) IMPLANT
SPONGE SURGIFOAM ABS GEL 100 (HEMOSTASIS) IMPLANT
SURGILUBE 2OZ TUBE FLIPTOP (MISCELLANEOUS) ×3 IMPLANT
SUT CHROMIC 2 0 SH (SUTURE) IMPLANT
SUT CHROMIC 3 0 SH 27 (SUTURE) IMPLANT
SUT MNCRL AB 4-0 PS2 18 (SUTURE) IMPLANT
SUT VIC AB 3-0 SH 27 (SUTURE)
SUT VIC AB 3-0 SH 27X BRD (SUTURE) IMPLANT
SYR 20ML LL LF (SYRINGE) ×3 IMPLANT
SYR CONTROL 10ML LL (SYRINGE) ×3 IMPLANT
TOWEL OR 17X26 10 PK STRL BLUE (TOWEL DISPOSABLE) ×3 IMPLANT
TOWEL OR NON WOVEN STRL DISP B (DISPOSABLE) ×3 IMPLANT

## 2019-11-03 NOTE — Op Note (Signed)
11/03/2019  9:08 AM  PATIENT:  Alexandra Christensen  49 y.o. female  Patient Care Team: Jeanice Lim, Velna Hatchet, MD as PCP - General (Family Medicine) Tanya Nones Priscille Heidelberg, MD (Family Medicine)  PRE-OPERATIVE DIAGNOSIS:  Anal fistula  POST-OPERATIVE DIAGNOSIS:  Same  PROCEDURE:   1. Partial fistulotomy x2 2. Anorectal exam under anesthesia  SURGEON:  Surgeon(s): Andria Meuse, MD  ASSISTANT: OR Staff   ANESTHESIA:   local and general  SPECIMEN:  No Specimen  DISPOSITION OF SPECIMEN:  N/A  COUNTS:  Sponge, needle, and instrument counts were reported correct x2 at conclusion.  EBL: 5 mL  Drains: None  PLAN OF CARE: Discharge to home after PACU  PATIENT DISPOSITION:  PACU - hemodynamically stable.  INDICATION: Ms. Gritz is a very pleasant 49yoF with history of diabetes, hypertension and approximately 1 year ago developed a large baseball sized abscess in the left buttock that was drained.  She has had intermittent drainage in this location and then the week prior to being seen in the office developed swelling on her right buttock.  She was given Bactrim by PCP with discontinued.  She underwent incision and drainage in the office with significant amounts of pus.  We had discussed everything at length with her including option for surgery to further interrogate her wounds to identify whether or not a fistula is present.  Please refer to notes elsewhere for details regarding these discussions.  She opted to proceed.  OR FINDINGS: Left lateral external opening consistent with an anal fistula with a chronic palpable tract emanating towards the anal canal.  Right anterior external opening/wound.  Circumferential anoscopy demonstrated no clearly evident internal opening but some scarring in the anterior midline.  Despite injection with peroxide and multiple attempts with fistula probes, no clear tract emptying into the anal canal could be proven.  When hydrogen peroxide was injected, the  contralateral external opening would drain.  Both external openings were cannulated but no clear communication to the anal canal could be proven.  Partial fistulotomy carried out with debridement of the fistulous tracts.  DESCRIPTION: The patient was identified in the preoperative holding area and taken to the OR. SCDs were applied. She then underwent general endotracheal anesthesia without difficulty. The patient was then rolled onto the OR table in the prone jackknife position. Pressure points were then evaluated and padded. Benzoin was applied to the buttocks and they were gently taped apart.  She was then prepped and draped in usual sterile fashion.  A surgical timeout was performed indicating the correct patient, procedure, and positioning.  A perianal block was then created using a dilute mixture of 0.25% Marcaine with epinephrine and Exparel.  After ascertaining an appropriate level of anesthesia had been achieved, a well lubricated digital rectal exam was performed. This demonstrated no palpable masses.  A Hill-Ferguson anoscope was into the anal canal and circumferential inspection demonstrated relatively normal-appearing anal canal.  There is some scarring in the anterior midline.  No clearly identifiable internal opening.  The external opening on the left side was gently cannulated with a well lubricated fistula probe.  I was unable to clearly prove the tract into the anal canal but this did track towards the anterior midline.  This tract was then injected with dilute peroxide and there was bubbles extravasating from the right anterior external opening.  There was no communication identified with peroxide into the anal canal.  The right anterior external opening was cannulated with a fistula probe and I could  not again prove any communication with the anal canal.  Given these multiple attempts and no peroxide emanating in the anal canal, it is also possible that the internal opening has somehow  healed or remains occult.  We do not want to create any false passages so further attempts were not made.  The external openings were partially opened carry out a partial fistulotomy but not dividing any muscle.  The granulation tissue from the tracts was then gently debrided.  The field was hemostatic.  Additional local anesthetic was infiltrated.  A dressing consisting of 4 x 4's, ABD, and mesh underwear was placed.  She was then rolled back onto a stretcher, awakened, extubated, transferred to recovery in satisfactory condition.  DISPOSITION: PACU in satisfactory condition.

## 2019-11-03 NOTE — Telephone Encounter (Signed)
Ok to refill??  Last office visit/ refill 06/18/2019.

## 2019-11-03 NOTE — Anesthesia Postprocedure Evaluation (Signed)
Anesthesia Post Note  Patient: Alexandra Christensen  Procedure(s) Performed: RECTAL EXAM UNDER ANESTHESIA (N/A ) PARTIAL ANAL FISTULOTOMY (N/A )     Patient location during evaluation: PACU Anesthesia Type: General Level of consciousness: awake and alert, oriented and patient cooperative Pain management: pain level controlled Vital Signs Assessment: post-procedure vital signs reviewed and stable Respiratory status: spontaneous breathing, nonlabored ventilation and respiratory function stable Cardiovascular status: blood pressure returned to baseline and stable Postop Assessment: no apparent nausea or vomiting Anesthetic complications: no   No complications documented.  Last Vitals:  Vitals:   11/03/19 0915 11/03/19 0930  BP: 122/66 136/80  Pulse: 84 79  Resp: 17 (!) 23  Temp: 36.6 C   SpO2: 96% 96%    Last Pain:  Vitals:   11/03/19 0915  TempSrc:   PainSc: 0-No pain                 Lannie Fields

## 2019-11-03 NOTE — H&P (Signed)
CC: Referred for evaluation of possible anal fistula  HPI: Alexandra Christensen is a very pleasant 49yoF with hx of DM, HTN and one year ago developed swelling on her left buttock she reports the size of a baseball and was drained. She has since had intermittent drainage from this location. There last week or so she's also developed swelling on the right side of her buttock. She states she was seen by her PCP, Dr. Rubin Payor, and performed a incision and drainage but she reports he only got a small amount of pus but some blood out. She has since had continued swelling on her right buttock. She reports this feels like a large ball that she is sitting on. She has been taking Bactrim last week but finished her course yesterday. She denies fevers. She has had some chills. She denies nausea/vomiting/abdominal pain. She reports that [facility] colonoscopy approximately 8 years ago with Dr. Elnoria Howard and she is unsure what the findings were.  She underwent I&D in the office and felt much better following. She is here today for surgery. Denies any changes in her health or health history.  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 Colitis   . Diabetes mellitus without complication (HCC)   . Headache   . Hyperlipidemia   . Hypertension     Past Surgical History:  Procedure Laterality Date  . INCISION AND DRAINAGE ABSCESS ANAL    . TEE WITHOUT CARDIOVERSION N/A 06/16/2013   Procedure: TRANSESOPHAGEAL ECHOCARDIOGRAM (TEE);  Surgeon: Chrystie Nose, MD;  Location[facility]  [facility] ENDOSCOPY;  Service: Cardiovascular;  Laterality: N/A;  . WISDOM TOOTH EXTRACTION      Family History  Problem Relation Age of Onset  . Diabetes Mother   . Heart disease Mother   . Kidney disease Father   . Heart disease Father   . Stroke Sister 22  . Cancer Sister        AML    Social:  reports that she has been smoking cigarettes. She has been smoking about 0.25 packs per day. She has never used smokeless tobacco. She reports current alcohol use of about 1.0 - 2.0 standard drink of alcohol per week. She reports that she does not use drugs.  Allergies:  Allergies  Allergen Reactions  . Adhesive [Tape] Rash    Medications: I have reviewed the patient's current medications.  Results for orders placed or performed during the hospital encounter of 11/03/19 (from the past 48 hour(s))  Glucose, capillary     Status: Abnormal   Collection Time: 11/03/19  6:58 AM  Result Value Ref Range   Glucose-Capillary 136 (H) 70 - 99 mg/dL    Comment: Glucose reference range applies only to samples taken after fasting for at least 8 hours.   Comment 1 Notify RN     No results found.  ROS - all of the below systems have been reviewed with the patient and positives are indicated with bold text General: chills, fever or night sweats Eyes: blurry vision or double vision ENT: epistaxis or sore throat Allergy/Immunology: itchy/watery eyes  or nasal congestion Hematologic/Lymphatic: bleeding problems, blood clots or swollen lymph nodes Endocrine: temperature intolerance or unexpected weight changes Breast: new or changing breast lumps or nipple discharge Resp: cough, shortness of breath, or wheezing CV: chest pain or dyspnea on exertion GI: as per HPI GU: dysuria, trouble voiding, or hematuria MSK: joint pain or joint stiffness Neuro: TIA or stroke symptoms Derm: pruritus and skin lesion changes Psych: anxiety and depression  PE Blood pressure 111/71, pulse 82, temperature 98.3 F (36.8  C), temperature source Oral, resp. rate 16, height 5\' 7"  (1.702 m), weight 108 kg, last menstrual period 04/05/2019, SpO2 96 %. Constitutional: NAD; conversant Eyes: Moist conjunctiva; no lid lag Lungs: Normal respiratory effort CV: RRR GI: Abd soft; no palpable hepatosplenomegaly Psychiatric: Appropriate affect; alert and oriented x3  Results for orders placed or performed during the hospital encounter of 11/03/19 (from the past 48 hour(s))  Glucose, capillary     Status: Abnormal   Collection Time: 11/03/19  6:58 AM  Result Value Ref Range   Glucose-Capillary 136 (H) 70 - 99 mg/dL    Comment: Glucose reference range applies only to samples taken after fasting for at least 8 hours.   Comment 1 Notify RN     No results found.   A/P: Alexandra Christensen is a very pleasant 49yoF with hx of HTN, DM - left sided anal fistula and now right sided perianal abscess  -The anatomy and physiology of the anal canal was discussed at length with the patient. The pathophysiology of anal fistula and abscess was discussed at length with associated pictures and illustrations. -We discussed proceeding with incision and drainage of the right perianal abscess. We then discussed proceeding to OR in ~1-2 months for anorectal exam under anesthesia, possible incision/drainage, possible seton, possible fistulotomy - all based on intraoperative findings and type of fistula(s) present. We discussed seton as a bridge to subsequent surgeries as well. -The planned procedures, material risks (including, but not limited to, pain, bleeding, infection, scarring, need for blood transfusion, damage to anal sphincter, incontinence of gas and/or stool, need for additional procedures, recurrence, pneumonia, heart attack, stroke, death) benefits and alternatives to surgery were discussed at length. I noted a good probability that the procedure would help improve their symptoms. The patient's questions were answered to their satisfaction,  they voiced understanding and elected to proceed with surgery. Additionally, we discussed typical postoperative expectations and the recovery process. -We also discussed that she will likely require multiple procedures to manage her fistula(s) and that it is not anticipated she will be done with everything after 1 surgery.  Su Hilt. Stephanie Coup, M.D. [facility] Use [email or web address] to contact on call provider

## 2019-11-03 NOTE — Transfer of Care (Signed)
Immediate Anesthesia Transfer of Care Note  Patient: Alexandra Christensen  Procedure(s) Performed: Procedure(s): RECTAL EXAM UNDER ANESTHESIA (N/A) PARTIAL ANAL FISTULOTOMY (N/A)  Patient Location: PACU  Anesthesia Type:General  Level of Consciousness:  sedated, patient cooperative and responds to stimulation  Airway & Oxygen Therapy:Patient Spontanous Breathing and Patient connected to face mask oxgen  Post-op Assessment:  Report given to PACU RN and Post -op Vital signs reviewed and stable  Post vital signs:  Reviewed and stable  Last Vitals:  Vitals:   11/03/19 0645 11/03/19 0915  BP: 111/71 122/66  Pulse: 82 84  Resp: 16 17  Temp: 36.8 C 36.6 C  SpO2: 96% 96%    Complications: No apparent anesthesia complications

## 2019-11-03 NOTE — Discharge Instructions (Signed)
ANORECTAL SURGERY: POST OP INSTRUCTIONS  No clear communication with the anal canal could be proven despite multiple attempts and injection of external openings with peroxide. External openings were partially opened to facilitate healing. We will plan to follow this area and if any recurrent issues, will likely need pelvic MRI to further evaluate before any additional potential 'procedures.'  1. DIET: Follow a light bland diet the first 24 hours after arrival home, such as soup, liquids, crackers, etc.  Be sure to include lots of fluids daily.  Avoid fast food or heavy meals as your are more likely to get nauseated.  Eat a low fat diet the next few days after surgery.   2. Some bleeding with bowel movements is expected for the first couple of days but this should stop in between bowel movements  3. Take your usually prescribed home medications unless otherwise directed.  4. PAIN CONTROL: a. It is helpful to take an over-the-counter pain medication regularly for the first few days/weeks.  Choose from the following that works best for you: i. Ibuprofen (Advil, etc) Three 200mg  tabs every 6 hours as needed. ii. Acetaminophen (Tylenol, etc) 500-650mg  every 6 hours as needed iii. NOTE: You may take both of these medications together - most patients find it most helpful when alternating between the two (i.e. Ibuprofen at 6am, tylenol at 9am, ibuprofen at 12pm ...) b. A  prescription for pain medication may have been prescribed for you at discharge.  Take your pain medication as prescribed.  i. If you are having problems/concerns with the prescription medicine, please call for further advice.  5. Avoid getting constipated.  Between the surgery and the pain medications, it is common to experience some constipation.  Increasing fluid intake (64oz of water per day) and taking a fiber supplement (such as Metamucil, Citrucel, FiberCon) 1-2 times a day regularly will usually help prevent this problem from  occurring.  Take Miralax (over the counter) 1-2x/day while taking a narcotic pain medication. If no bowel movement after 48hours, you may additionally take a laxative like a bottle of Milk of Magnesia which can be purchased over the counter. Avoid enemas if possible as these are often painful.   6. Watch out for diarrhea.  If you have many loose bowel movements, simplify your diet to bland foods.  Stop any stool softeners and decrease your fiber supplement. If this worsens or does not improve, please call us.  7. Wash / shower every day.  If you were discharged with a dressing, you may remove this the day after your surgery. You may shower normally, getting soap/water on your wound, particularly after bowel movements.  8. Soaking in a warm bath filled a couple inches ("Sitz bath") is a great way to clean the area after a bowel movement and many patients find it is a way to soothe the area.  9. ACTIVITIES as tolerated:   a. You may resume regular (light) daily activities beginning the next day--such as daily self-care, walking, climbing stairs--gradually increasing activities as tolerated.  If you can walk 30 minutes without difficulty, it is safe to try more intense activity such as jogging, treadmill, bicycling, low-impact aerobics, etc. b. Refrain from any heavy lifting or straining for the first 2 weeks after your procedure, particularly if your surgery was for hemorrhoids. c. Avoid activities that make your pain worse d. You may drive when you are no longer taking prescription pain medication, you can comfortably wear a seatbelt, and you can safely maneuver  your car and apply brakes.  10. FOLLOW UP in our office a. Please call [facility] at [phone] to set up an appointment to see your surgeon in the office for a follow-up appointment approximately 2 weeks after your surgery. b. Make sure that you call for this appointment the day you arrive home to insure a convenient appointment time.  9. If  you have disability or family leave forms that need to be completed, you may have them completed by your primary care physician's office; for return to work instructions, please ask our office staff and they will be happy to assist you in obtaining this documentation   When to call us [phone]: 1. Poor pain control 2. Reactions / problems with new medications (rash/itching, etc)  3. Fever over 101.5 F (38.5 C) 4. Inability to urinate 5. Nausea/vomiting 6. Worsening swelling or bruising 7. Continued bleeding from incision. 8. Increased pain, redness, or drainage from the incision  The clinic staff is available to answer your questions during regular business hours (8:30am-5pm).  Please don't hesitate to call and ask to speak to one of our nurses for clinical concerns.   A surgeon from [facility] is always on call at the hospitals   If you have a medical emergency, go to the nearest emergency room or call 911.   [facility] [address], [place], [place]  [address] ? MAIN: [phone] FAX [phone] [email or web address]

## 2019-11-04 ENCOUNTER — Encounter (HOSPITAL_COMMUNITY): Payer: Self-pay | Admitting: Surgery

## 2019-11-24 ENCOUNTER — Other Ambulatory Visit: Payer: Self-pay | Admitting: *Deleted

## 2019-11-24 MED ORDER — GABAPENTIN 300 MG PO CAPS: ORAL_CAPSULE | ORAL | 3 refills | Status: DC

## 2019-12-06 ENCOUNTER — Other Ambulatory Visit: Payer: Self-pay

## 2019-12-06 ENCOUNTER — Ambulatory Visit (INDEPENDENT_AMBULATORY_CARE_PROVIDER_SITE_OTHER): Payer: BC Managed Care – PPO | Admitting: Family Medicine

## 2019-12-06 ENCOUNTER — Encounter: Payer: Self-pay | Admitting: Family Medicine

## 2019-12-06 VITALS — BP 140/82 | HR 86 | Temp 98.0°F | Resp 14 | Ht 67.0 in | Wt 236.0 lb

## 2019-12-06 DIAGNOSIS — Z23 Encounter for immunization: Secondary | ICD-10-CM | POA: Diagnosis not present

## 2019-12-06 DIAGNOSIS — Z0000 Encounter for general adult medical examination without abnormal findings: Secondary | ICD-10-CM | POA: Diagnosis not present

## 2019-12-06 DIAGNOSIS — R6889 Other general symptoms and signs: Secondary | ICD-10-CM

## 2019-12-06 DIAGNOSIS — Z1159 Encounter for screening for other viral diseases: Secondary | ICD-10-CM | POA: Diagnosis not present

## 2019-12-06 DIAGNOSIS — G629 Polyneuropathy, unspecified: Secondary | ICD-10-CM

## 2019-12-06 DIAGNOSIS — E1149 Type 2 diabetes mellitus with other diabetic neurological complication: Secondary | ICD-10-CM

## 2019-12-06 DIAGNOSIS — E781 Pure hyperglyceridemia: Secondary | ICD-10-CM

## 2019-12-06 DIAGNOSIS — I10 Essential (primary) hypertension: Secondary | ICD-10-CM

## 2019-12-06 DIAGNOSIS — Z6836 Body mass index (BMI) 36.0-36.9, adult: Secondary | ICD-10-CM | POA: Diagnosis not present

## 2019-12-06 NOTE — Assessment & Plan Note (Signed)
Pressure mildly elevated.  She has been checking at home and has been normal.

## 2019-12-06 NOTE — Assessment & Plan Note (Signed)
She has been intentionally trying to lose weight.  Continue with low-carb diet decrease processed foods

## 2019-12-06 NOTE — Patient Instructions (Addendum)
Call Dr. Annalee Genta ENT for your tongue Schedule with your GYN  F/U 4 months

## 2019-12-06 NOTE — Assessment & Plan Note (Signed)
Diabetes type 2 with neuropathy.  We will check B12 level today.  She has discontinued the Metformin I think she would benefit from GLP-1 therapy if her insurance cover this.  If not we will put her on Farxiga or Jardiance

## 2019-12-06 NOTE — Progress Notes (Signed)
Subjective:    Patient ID: Alexandra Christensen, female    DOB: December 20, 1970, 48 y.o.   MRN: [id number]  Patient presents for Annual Exam (is fasting)   Pt here for CPE  meds and history reviewed DM with neuropathy - last A1C 6.8% in Sept  , she went off Metformin 3 weeks ago, when she couldn't get from the pharmacy, states her neuropathy improved when she went off of the Metformin therefore she would like to go on something different instead.   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 She is doing well since surgery   Uses xanaxx at night for sleep   PAP Smear UTD    Had Colonoscopy Dr. 12-07-1976 [facility]      Review Of Systems:  GEN- denies fatigue, fever, weight loss,weakness, recent illness HEENT- denies eye drainage, change in vision, nasal discharge, CVS- denies chest pain, palpitations RESP- denies SOB, cough, wheeze ABD- denies N/V, change in stools, abd pain GU- denies dysuria, hematuria, dribbling, incontinence MSK- denies joint pain, muscle aches, injury Neuro- denies headache, dizziness, syncope, seizure activity       Objective:    BP 140/82   Pulse 86   Temp 98 F (36.7 C) (Temporal)   Resp 14   Ht 5\' 7"  (1.702 m)   Wt 236 lb (107 kg)   SpO2 99%   BMI 36.96 kg/m  GEN- NAD, alert and oriented x3,obese  HEENT- PERRL, EOMI, non injected sclera, pink conjunctiva, MMM, oropharynx clear, right side of tongue small swelling Neck- Supple, no thyromegaly CVS- RRR, no murmur RESP-CTAB ABD-NABS,soft,NT,ND EXT- No edema Pulses- Radial, DP- 2+  FALL/AUDIT C/Depression screen neg       Assessment & Plan:  Patient states she also has a spot on her tongue that has been present for few months now.  She was seen by her dentist they were unclear what was causing the swelling.  States that initially  was a red spot.  She has a ear nose and throat she has not scheduled within the recommend she schedule an appointment to have it evaluated since her dentist could not figure out what this was.  She is also scheduled with her GYN   Problem List Items Addressed This Visit      Unprioritized   Essential hypertension    Pressure mildly elevated.  She has been checking at home and has been normal.      Relevant Orders   CBC with Differential/Platelet (Completed)   Comprehensive metabolic panel   Hypertriglyceridemia   Relevant Orders   Lipid panel   Neuropathy   Obesity    She has been intentionally trying to lose weight.  Continue with low-carb diet decrease processed foods      Type 2 diabetes mellitus with neurological complications (HCC)    Diabetes type 2 with neuropathy.  We will check B12 level today.  She has discontinued the Metformin I think she would benefit from GLP-1 therapy if her insurance cover this.  If not we will put her on Farxiga or Jardiance      Relevant Orders   Hemoglobin A1c   Lipid panel    Other Visit Diagnoses    Routine general medical examination at a health care facility    -  Primary   CPE done, fasting labs obtained , Hep C screen, Flu shot given    Need for hepatitis C screening test       Relevant Orders   Hepatitis C antibody   Other general symptoms and signs       Relevant Orders   Vitamin B12   Need for immunization against influenza       Relevant Orders   Flu Vaccine QUAD 36+ mos IM (Completed)      Note: This dictation was prepared with Dragon dictation along with smaller phrase technology. Any transcriptional errors that result from this process are unintentional.

## 2019-12-07 LAB — CBC WITH DIFFERENTIAL/PLATELET
Absolute Monocytes: 422 cells/uL (ref 200–950)
Basophils Absolute: 58 cells/uL (ref 0–200)
Basophils Relative: 0.6 %
Eosinophils Absolute: 403 cells/uL (ref 15–500)
Eosinophils Relative: 4.2 %
HCT: 48.4 % — ABNORMAL HIGH (ref 35.0–45.0)
Hemoglobin: 16.2 g/dL — ABNORMAL HIGH (ref 11.7–15.5)
Lymphs Abs: 2525 cells/uL (ref 850–3900)
MCH: 30.1 pg (ref 27.0–33.0)
MCHC: 33.5 g/dL (ref 32.0–36.0)
MCV: 90 fL (ref 80.0–100.0)
MPV: 12.3 fL (ref 7.5–12.5)
Monocytes Relative: 4.4 %
Neutro Abs: 6192 cells/uL (ref 1500–7800)
Neutrophils Relative %: 64.5 %
Platelets: 300 10*3/uL (ref 140–400)
RBC: 5.38 10*6/uL — ABNORMAL HIGH (ref 3.80–5.10)
RDW: 13.9 % (ref 11.0–15.0)
Total Lymphocyte: 26.3 %
WBC: 9.6 10*3/uL (ref 3.8–10.8)

## 2019-12-07 LAB — HEMOGLOBIN A1C
Hgb A1c MFr Bld: 6.5 % of total Hgb — ABNORMAL HIGH (ref <5.7)
Mean Plasma Glucose: 140 (calc)
eAG (mmol/L): 7.7 (calc)

## 2019-12-07 LAB — COMPREHENSIVE METABOLIC PANEL
AG Ratio: 1.6 (calc) (ref 1.0–2.5)
ALT: 13 U/L (ref 6–29)
AST: 12 U/L (ref 10–35)
Albumin: 4.3 g/dL (ref 3.6–5.1)
Alkaline phosphatase (APISO): 93 U/L (ref 37–153)
BUN: 14 mg/dL (ref 7–25)
CO2: 25 mmol/L (ref 20–32)
Calcium: 10.2 mg/dL (ref 8.6–10.4)
Chloride: 104 mmol/L (ref 98–110)
Creat: 0.91 mg/dL (ref 0.50–1.05)
Globulin: 2.7 g/dL (calc) (ref 1.9–3.7)
Glucose, Bld: 143 mg/dL — ABNORMAL HIGH (ref 65–99)
Potassium: 4.1 mmol/L (ref 3.5–5.3)
Sodium: 140 mmol/L (ref 135–146)
Total Bilirubin: 0.6 mg/dL (ref 0.2–1.2)
Total Protein: 7 g/dL (ref 6.1–8.1)

## 2019-12-07 LAB — LIPID PANEL
Cholesterol: 162 mg/dL (ref <200)
HDL: 45 mg/dL — ABNORMAL LOW (ref >=50)
LDL Cholesterol (Calc): 84 mg/dL (calc)
Non-HDL Cholesterol (Calc): 117 mg/dL (calc) (ref <130)
Total CHOL/HDL Ratio: 3.6 (calc) (ref <5.0)
Triglycerides: 254 mg/dL — ABNORMAL HIGH (ref <150)

## 2019-12-07 LAB — HEPATITIS C ANTIBODY
Hepatitis C Ab: NONREACTIVE
SIGNAL TO CUT-OFF: 0.03 (ref <1.00)

## 2019-12-07 LAB — VITAMIN B12: Vitamin B-12: 643 pg/mL (ref 200–1100)

## 2019-12-08 MED ORDER — OZEMPIC (0.25 OR 0.5 MG/DOSE) 2 MG/1.5ML ~~LOC~~ SOPN: 0.2500 mg | PEN_INJECTOR | SUBCUTANEOUS | 2 refills | Status: DC

## 2019-12-08 NOTE — Addendum Note (Signed)
Addended by: Milinda Antis F on: 12/08/2019 09:33 AM   Modules accepted: Orders

## 2020-02-19 DIAGNOSIS — K603 Anal fistula, unspecified: Secondary | ICD-10-CM

## 2020-02-19 HISTORY — DX: Anal fistula: K60.3

## 2020-02-19 HISTORY — DX: Anal fistula, unspecified: K60.30

## 2020-04-24 ENCOUNTER — Ambulatory Visit (INDEPENDENT_AMBULATORY_CARE_PROVIDER_SITE_OTHER): Payer: BC Managed Care – PPO | Admitting: Family Medicine

## 2020-04-24 ENCOUNTER — Other Ambulatory Visit: Payer: Self-pay

## 2020-04-24 ENCOUNTER — Encounter: Payer: Self-pay | Admitting: Family Medicine

## 2020-04-24 VITALS — BP 138/72 | HR 78 | Temp 97.4°F | Resp 18 | Ht 67.0 in | Wt 228.8 lb

## 2020-04-24 DIAGNOSIS — E1149 Type 2 diabetes mellitus with other diabetic neurological complication: Secondary | ICD-10-CM | POA: Diagnosis not present

## 2020-04-24 DIAGNOSIS — I10 Essential (primary) hypertension: Secondary | ICD-10-CM

## 2020-04-24 DIAGNOSIS — E781 Pure hyperglyceridemia: Secondary | ICD-10-CM | POA: Diagnosis not present

## 2020-04-24 DIAGNOSIS — Z6836 Body mass index (BMI) 36.0-36.9, adult: Secondary | ICD-10-CM

## 2020-04-24 DIAGNOSIS — G629 Polyneuropathy, unspecified: Secondary | ICD-10-CM

## 2020-04-24 MED ORDER — ATORVASTATIN CALCIUM 20 MG PO TABS
20.0000 mg | ORAL_TABLET | Freq: Every day | ORAL | 3 refills | Status: DC
Start: 2020-04-24 — End: 2021-07-13

## 2020-04-24 MED ORDER — GABAPENTIN 300 MG PO CAPS
ORAL_CAPSULE | ORAL | 3 refills | Status: DC
Start: 2020-04-24 — End: 2021-07-13

## 2020-04-24 MED ORDER — ALPRAZOLAM 1 MG PO TABS
1.0000 mg | ORAL_TABLET | Freq: Every evening | ORAL | 1 refills | Status: DC | PRN
Start: 2020-04-24 — End: 2021-08-27

## 2020-04-24 MED ORDER — LOSARTAN POTASSIUM 25 MG PO TABS
25.0000 mg | ORAL_TABLET | Freq: Every day | ORAL | 2 refills | Status: DC
Start: 2020-04-24 — End: 2021-07-13

## 2020-04-24 NOTE — Assessment & Plan Note (Signed)
Continue gabapentin, symptoms controlled

## 2020-04-24 NOTE — Assessment & Plan Note (Addendum)
Has not checked CBG, goal A1C below 6.5%, she is getting weight loss benefit from GLP-1 therapy as well  On statin drug  Recommend eye exam

## 2020-04-24 NOTE — Assessment & Plan Note (Signed)
Controlled no changes to meds Fasting labs obtained  

## 2020-04-24 NOTE — Patient Instructions (Addendum)
F/U 4 months Dr. Tanya Nones  Continue 0.25mg  once a week Eye doctor schedule appointment

## 2020-04-24 NOTE — Progress Notes (Signed)
   Subjective:    Patient ID: Alexandra Christensen, female    DOB: [birth year], 50 y.o.   MRN: [id number]  Patient presents for Diabetes and Hypertension  DM- taking ozempic  0.25mg  once a week, weight down 10lbs since Sept 2021, she has not checked her CBG recently, no hypoglycemia symptoms   HLD-  Taking statin without dificulty   GAD- xanax needs refills takes for sleep . Recently her beloved pet passed away and a good friend, but in general feels she is at peace with things   Neuropathy- taking gabapentin twice a day   She wants to hold on mammogram and PAP at this time   Review Of Systems:  GEN- denies fatigue, fever, weight loss,weakness, recent illness HEENT- denies eye drainage, change in vision, nasal discharge, CVS- denies chest pain, palpitations RESP- denies SOB, cough, wheeze ABD- denies N/V, change in stools, abd pain GU- denies dysuria, hematuria, dribbling, incontinence MSK- denies joint pain, muscle aches, injury Neuro- denies headache, dizziness, syncope, seizure activity       Objective:    BP 138/72   Pulse 78   Temp (!) 97.4 F (36.3 C)   Resp 18   Ht 5\' 7"  (1.702 m)   Wt 228 lb 12.8 oz (103.8 kg)   SpO2 94%   BMI 35.84 kg/m  GEN- NAD, alert and oriented x3 HEENT- PERRL, EOMI, non injected sclera, pink conjunctiva, MMM, oropharynx clear Neck- Supple, no thyromegaly CVS- RRR, no murmur RESP-CTAB ABD-NABS,soft,NT,ND EXT- No edema Pulses- Radial, DP- 2+        Assessment & Plan:      Problem List Items Addressed This Visit      Unprioritized   Essential hypertension - Primary    Controlled no changes to meds Fasting labs obtained       Relevant Medications   losartan (COZAAR) 25 MG tablet   atorvastatin (LIPITOR) 20 MG tablet   Other Relevant Orders   CBC with Differential/Platelet   Comprehensive metabolic panel   Lipid panel   Hypertriglyceridemia   Relevant Medications   losartan (COZAAR) 25 MG tablet   atorvastatin (LIPITOR)  20 MG tablet   Other Relevant Orders   Lipid panel   Neuropathy    Continue gabapentin, symptoms controlled       Obesity   Type 2 diabetes mellitus with neurological complications (HCC)    Has not checked CBG, goal A1C below 6.5%, she is getting weight loss benefit from GLP-1 therapy as well  On statin drug  Recommend eye exam       Relevant Medications   losartan (COZAAR) 25 MG tablet   atorvastatin (LIPITOR) 20 MG tablet   Other Relevant Orders   Hemoglobin A1c      Note: This dictation was prepared with Dragon dictation along with smaller phrase technology. Any transcriptional errors that result from this process are unintentional.

## 2020-04-25 LAB — CBC WITH DIFFERENTIAL/PLATELET
Absolute Monocytes: 412 cells/uL (ref 200–950)
Basophils Absolute: 82 cells/uL (ref 0–200)
Basophils Relative: 0.8 %
Eosinophils Absolute: 546 cells/uL — ABNORMAL HIGH (ref 15–500)
Eosinophils Relative: 5.3 %
HCT: 45.4 % — ABNORMAL HIGH (ref 35.0–45.0)
Hemoglobin: 15.6 g/dL — ABNORMAL HIGH (ref 11.7–15.5)
Lymphs Abs: 2719 cells/uL (ref 850–3900)
MCH: 31 pg (ref 27.0–33.0)
MCHC: 34.4 g/dL (ref 32.0–36.0)
MCV: 90.3 fL (ref 80.0–100.0)
MPV: 12.4 fL (ref 7.5–12.5)
Monocytes Relative: 4 %
Neutro Abs: 6541 cells/uL (ref 1500–7800)
Neutrophils Relative %: 63.5 %
Platelets: 321 10*3/uL (ref 140–400)
RBC: 5.03 10*6/uL (ref 3.80–5.10)
RDW: 13.1 % (ref 11.0–15.0)
Total Lymphocyte: 26.4 %
WBC: 10.3 10*3/uL (ref 3.8–10.8)

## 2020-04-25 LAB — COMPREHENSIVE METABOLIC PANEL
AG Ratio: 1.5 (calc) (ref 1.0–2.5)
ALT: 8 U/L (ref 6–29)
AST: 10 U/L (ref 10–35)
Albumin: 4 g/dL (ref 3.6–5.1)
Alkaline phosphatase (APISO): 87 U/L (ref 37–153)
BUN: 13 mg/dL (ref 7–25)
CO2: 28 mmol/L (ref 20–32)
Calcium: 9.6 mg/dL (ref 8.6–10.4)
Chloride: 105 mmol/L (ref 98–110)
Creat: 0.85 mg/dL (ref 0.50–1.05)
Globulin: 2.7 g/dL (calc) (ref 1.9–3.7)
Glucose, Bld: 94 mg/dL (ref 65–99)
Potassium: 4.9 mmol/L (ref 3.5–5.3)
Sodium: 140 mmol/L (ref 135–146)
Total Bilirubin: 0.4 mg/dL (ref 0.2–1.2)
Total Protein: 6.7 g/dL (ref 6.1–8.1)

## 2020-04-25 LAB — LIPID PANEL
Cholesterol: 150 mg/dL (ref <200)
HDL: 43 mg/dL — ABNORMAL LOW (ref >=50)
LDL Cholesterol (Calc): 84 mg/dL (calc)
Non-HDL Cholesterol (Calc): 107 mg/dL (calc) (ref <130)
Total CHOL/HDL Ratio: 3.5 (calc) (ref <5.0)
Triglycerides: 132 mg/dL (ref <150)

## 2020-04-25 LAB — HEMOGLOBIN A1C
Hgb A1c MFr Bld: 5.8 % of total Hgb — ABNORMAL HIGH (ref <5.7)
Mean Plasma Glucose: 120 mg/dL
eAG (mmol/L): 6.6 mmol/L

## 2020-05-22 ENCOUNTER — Encounter: Payer: Self-pay | Admitting: *Deleted

## 2020-05-30 ENCOUNTER — Other Ambulatory Visit: Payer: Self-pay | Admitting: Family Medicine

## 2020-06-12 ENCOUNTER — Other Ambulatory Visit: Payer: Self-pay | Admitting: Family Medicine

## 2020-06-14 ENCOUNTER — Other Ambulatory Visit: Payer: Self-pay | Admitting: Family Medicine

## 2020-06-29 ENCOUNTER — Encounter (HOSPITAL_BASED_OUTPATIENT_CLINIC_OR_DEPARTMENT_OTHER): Payer: Self-pay | Admitting: Surgery

## 2020-06-30 ENCOUNTER — Other Ambulatory Visit: Payer: Self-pay

## 2020-06-30 ENCOUNTER — Ambulatory Visit: Payer: Self-pay | Admitting: Surgery

## 2020-06-30 ENCOUNTER — Encounter (HOSPITAL_BASED_OUTPATIENT_CLINIC_OR_DEPARTMENT_OTHER): Payer: Self-pay | Admitting: Surgery

## 2020-06-30 NOTE — Progress Notes (Signed)
Spoke w/ via phone for pre-op interview--- PT Lab needs dos---- Istat, Urine preg              Lab results------ current ekg in epic/ chart COVID test ------ 07-03-2020 @ 1305 Arrive at ------- 0730 on 07-05-2020 NPO after MN NO Solid Food.  Clear liquids from MN until--- 0630 Med rec completed Medications to take morning of surgery ----- Gabapentin, Lipitor Diabetic medication ----- n/a Patient instructed to bring photo id and insurance card day of surgery Patient aware to have Driver (ride ) / caregiver    for 24 hours after surgery --sig other, Alexandra Christensen Patient Special Instructions ----- n/a Pre-Op special Istructions ----- n/a Patient verbalized understanding of instructions that were given at this phone interview. Patient denies shortness of breath, chest pain, fever, cough at this phone interview.

## 2020-06-30 NOTE — H&P (Signed)
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 is a very pleasant 49yoF with hx of DM, HTN and one year ago developed swelling on her left buttock she reports the size of a baseball and was drained. She has since had intermittent drainage from this location. There last week or so she's also developed swelling on the right side of her buttock. She states she was seen by her PCP, Dr. Rubin Payor, and performed a incision and drainage but she reports he only got a small amount of pus but some blood out. She has since had continued swelling on her right buttock. She reports this feels like a large ball that she is sitting on. She has been taking Bactrim last week but finished her course yesterday. She denies fevers. She has had some chills. She denies nausea/vomiting/abdominal pain. She reports that [facility] colonoscopy approximately 8 years ago with Dr. Elnoria Howard and she is unsure what the findings were.  OR 11/03/19 - EUA, partial fistulotomy. She was found to have communication between the wounds on her right and left buttocks - despite multiple attempts with fistula probe and peroxide, there was no definitive fistula proven to anal canal - if she has a fistula, would likely be anterior midline.  INTERVAL HX She was lost to follow-up but returns today for re-establishing care. Reports a 1 week history of pain/swelling in anterior right buttock. No fevers. Left gluteal opening as continued to open and drain monthly. This is the first time she has had any trouble with her right buttock since surgery.  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    Allergies Blanchie Dessert, CMA; 05/16/2020 9:37 AM) No Known Drug Allergies  [09/03/2019]: Allergies Reconciled   Medication History Blanchie Dessert, CMA; 05/16/2020 9:40 AM) Ozempic (1 MG/DOSE) (2MG /1.5ML Soln Pen-inj, Subcutaneous) Active. Ibuprofen (800MG  Tablet, Oral) Active. ALPRAZolam (1MG  Tablet, Oral) Active. Atorvastatin Calcium (20MG  Tablet, Oral) Active. Gabapentin (300MG  Capsule, Oral) Active. Losartan Potassium (25MG  Tablet, Oral) Active. metFORMIN HCl (1000MG  Tablet, Oral) Active. Medications Reconciled    Review of Systems M. Deyna Carbon MD; 05/16/2020 9:48 AM) General Present- Night Sweats. Not Present- Appetite Loss, Chills, Fatigue, Fever, Weight Gain and Weight Loss. Skin Present- New Lesions. Not Present- Change in Wart/Mole, Dryness, Hives, Jaundice, Non-Healing Wounds, Rash and Ulcer. HEENT Present- Seasonal Allergies and Wears glasses/contact lenses. Not Present- Earache, Hearing Loss, Hoarseness, Nose Bleed, Oral Ulcers, Ringing in the Ears, Sinus Pain, Sore Throat, Visual Disturbances and Yellow Eyes. Respiratory Not Present- Cough and Decreased Exercise Tolerance. Cardiovascular Not Present- Chest Pain and Difficulty Breathing On Exertion. Gastrointestinal Present- Rectal Pain. Not Present- Abdominal Pain and Nausea. Musculoskeletal Not Present- Back Pain and Decreased Range of Motion. Psychiatric Not Present- Anxiety and Depression. Hematology Not Present- Abnormal Bleeding, Blood Clots and Blood Thinners.   Physical Exam M. Jalyne Brodzinski MD; 05/16/2020 9:48 AM) The physical exam findings are as follows: Note: Constitutional: No acute distress; conversant; no deformities; wearing mask Eyes: Moist conjunctiva; no lid lag; anicteric sclerae; pupils equal round and reactive to light Neck: Trachea midline; no  palpable  thyromegaly Lungs: Normal respiratory effort; no tactile fremitus CV: rrr; no palpable thrill; no pitting edema GI: Abdomen soft, nontender, nondistended; no palpable hepatosplenomegaly Anorectal: Left sided anterolateral external opening with palpable tract toward anus, open. Right anterior perianal abscess with some erythema and induration and fluctuance. DRE - normal tone, no palpable masses Anoscopy: Circumferential anoscopy somewhat limited due to patient intolerance but no obvious fissures or obvious abnormalities noted. MSK: Normal gait; no clubbing/cyanosis Psychiatric: Appropriate affect; alert and oriented 3 Lymphatic: No palpable cervical or axillary lymphadenopathy **A chaperone, Alexandra Christensen, was present for this encounter    Assessment & Plan Cristal Deer M. Kenya Kook MD; 05/16/2020 10:09 AM) ANAL FISTULA (K60.3) Story: [name] is a very pleasant 49yoF with hx of HTN, DM - left and right buttock fistulas - currently with abscess at a closed prior external opening on the right Impression: -The anatomy and physiology of the anal canal was discussed with the patient. The pathophysiology of anal abscess and fistula was discussed as well -Given the apparent right buttock abscess, discussed proceeding with incision and drainage of right buttock abscess... subsequently scheduling for anorectal exam under anesthesia, possible incision/drainage and re-attempt at seton placement -Rx for abx (Augmentin) x5 days given her underlying hx of diabetes -The planned procedures, material risks (including, but not limited to, pain, bleeding, infection, scarring, need for blood transfusion, damage to anal sphincter, incontinence of gas and/or stool, need for additional procedures, recurrence, again failure to identify internal opening, pneumonia, heart attack, stroke, death) benefits and alternatives to surgery were discussed at length. The patient's questions were answered to her satisfaction, she  voiced understanding and elected to proceed with surgery. Additionally, we discussed typical postoperative expectations and the recovery process.  This patient encounter took 43 minutes today to perform the following: take history, perform exam, review outside records, interpret imaging, counsel the patient on their diagnosis and document encounter, findings & plan in the EHR  ANOSCOPY, DIAGNOSTIC (31497) INCISION AND DRAINAGE OF PERIRECTAL ABSCESS [phone]) The anatomy & physiology of the anorectal region was discussed. The pathophysiology of anorectal abscess and differential diagnosis was discussed. Natural history progression such as fistula, gangrene, etc was discussed. I stressed the importance of a bowel regimen to have daily soft bowel movements to minimize progression of disease.  The patient's symptoms are not adequately controlled. Non-operative treatment has not healed the abscess. Therefore, I recommended incision & drainage of the abscess to allow the infection to resolve and heal. Technique, risks, benefits, alternatives discussed. The patient expressed understanding & wished to proceed. She denies any allergies.  The patient was positioned lateral decubitus. I placed a field block with local anaesthetic in the right ischiorectal fossa skin. I incised the skin over the abscess to release the infection. I excised skin at the wound to have an adequate opening for drainage & prevent skin reclosure. It was hemostatic and therefore not packed. The patient tolerated the procedure.  Educational handouts further explaining the pathology, treatment options, and bowel regimen were given. We will have the patient return to clinic for close follow up to make sure the infection heals   Marin Olp, MD [facility] Use [email or web address] to contact on call provider

## 2020-07-03 ENCOUNTER — Other Ambulatory Visit (HOSPITAL_COMMUNITY)
Admission: RE | Admit: 2020-07-03 | Discharge: 2020-07-03 | Disposition: A | Discharge status: Home / Self Care | Payer: BC Managed Care – PPO | Source: Ambulatory Visit | Attending: Surgery | Admitting: Surgery

## 2020-07-03 DIAGNOSIS — Z20822 Contact with and (suspected) exposure to covid-19: Secondary | ICD-10-CM | POA: Insufficient documentation

## 2020-07-03 DIAGNOSIS — Z01812 Encounter for preprocedural laboratory examination: Secondary | ICD-10-CM | POA: Insufficient documentation

## 2020-07-03 DIAGNOSIS — F17210 Nicotine dependence, cigarettes, uncomplicated: Secondary | ICD-10-CM | POA: Diagnosis not present

## 2020-07-03 DIAGNOSIS — K611 Rectal abscess: Secondary | ICD-10-CM | POA: Diagnosis present

## 2020-07-03 DIAGNOSIS — E119 Type 2 diabetes mellitus without complications: Secondary | ICD-10-CM | POA: Diagnosis not present

## 2020-07-03 DIAGNOSIS — K603 Anal fistula: Secondary | ICD-10-CM | POA: Diagnosis not present

## 2020-07-03 DIAGNOSIS — Z888 Allergy status to other drugs, medicaments and biological substances status: Secondary | ICD-10-CM | POA: Diagnosis not present

## 2020-07-03 DIAGNOSIS — I10 Essential (primary) hypertension: Secondary | ICD-10-CM | POA: Diagnosis not present

## 2020-07-03 NOTE — Progress Notes (Signed)
Surgery time change. Patient to arrive at 6:30 for 830 procedure time.

## 2020-07-04 LAB — SARS CORONAVIRUS 2 (TAT 6-24 HRS): SARS Coronavirus 2: NEGATIVE

## 2020-07-05 ENCOUNTER — Ambulatory Visit (HOSPITAL_BASED_OUTPATIENT_CLINIC_OR_DEPARTMENT_OTHER): Payer: BC Managed Care – PPO | Admitting: Anesthesiology

## 2020-07-05 ENCOUNTER — Encounter (HOSPITAL_BASED_OUTPATIENT_CLINIC_OR_DEPARTMENT_OTHER): Payer: Self-pay | Admitting: Surgery

## 2020-07-05 ENCOUNTER — Ambulatory Visit (HOSPITAL_BASED_OUTPATIENT_CLINIC_OR_DEPARTMENT_OTHER)
Admission: RE | Admit: 2020-07-05 | Discharge: 2020-07-05 | Disposition: A | Discharge status: Home / Self Care | Payer: BC Managed Care – PPO | Attending: Surgery | Admitting: Surgery

## 2020-07-05 ENCOUNTER — Encounter (HOSPITAL_BASED_OUTPATIENT_CLINIC_OR_DEPARTMENT_OTHER)
Admission: RE | Disposition: A | Discharge status: Home / Self Care | Payer: Self-pay | Source: Home / Self Care | Attending: Surgery

## 2020-07-05 DIAGNOSIS — F17210 Nicotine dependence, cigarettes, uncomplicated: Secondary | ICD-10-CM | POA: Insufficient documentation

## 2020-07-05 DIAGNOSIS — I10 Essential (primary) hypertension: Secondary | ICD-10-CM | POA: Insufficient documentation

## 2020-07-05 DIAGNOSIS — Z888 Allergy status to other drugs, medicaments and biological substances status: Secondary | ICD-10-CM | POA: Insufficient documentation

## 2020-07-05 DIAGNOSIS — E119 Type 2 diabetes mellitus without complications: Secondary | ICD-10-CM | POA: Insufficient documentation

## 2020-07-05 DIAGNOSIS — K611 Rectal abscess: Secondary | ICD-10-CM | POA: Insufficient documentation

## 2020-07-05 DIAGNOSIS — K603 Anal fistula: Secondary | ICD-10-CM | POA: Diagnosis not present

## 2020-07-05 DIAGNOSIS — Z20822 Contact with and (suspected) exposure to covid-19: Secondary | ICD-10-CM | POA: Insufficient documentation

## 2020-07-05 HISTORY — DX: Type 2 diabetes mellitus without complications: E11.9

## 2020-07-05 HISTORY — DX: Family history of other specified conditions: Z84.89

## 2020-07-05 HISTORY — PX: INCISION AND DRAINAGE ABSCESS: SHX5864

## 2020-07-05 HISTORY — DX: Personal history of other diseases of the digestive system: Z87.19

## 2020-07-05 HISTORY — DX: Rectal abscess: K61.1

## 2020-07-05 HISTORY — DX: Disease of spleen, unspecified: D73.9

## 2020-07-05 LAB — POCT I-STAT, CHEM 8
BUN: 20 mg/dL (ref 6–20)
Calcium, Ion: 1.32 mmol/L (ref 1.15–1.40)
Chloride: 103 mmol/L (ref 98–111)
Creatinine, Ser: 1 mg/dL (ref 0.44–1.00)
Glucose, Bld: 109 mg/dL — ABNORMAL HIGH (ref 70–99)
HCT: 48 % — ABNORMAL HIGH (ref 36.0–46.0)
Hemoglobin: 16.3 g/dL — ABNORMAL HIGH (ref 12.0–15.0)
Potassium: 4.5 mmol/L (ref 3.5–5.1)
Sodium: 140 mmol/L (ref 135–145)
TCO2: 27 mmol/L (ref 22–32)

## 2020-07-05 LAB — POCT PREGNANCY, URINE: Preg Test, Ur: NEGATIVE

## 2020-07-05 SURGERY — INCISION AND DRAINAGE, ABSCESS
Anesthesia: General | Site: Rectum

## 2020-07-05 MED ORDER — BUPIVACAINE-EPINEPHRINE 0.25% -1:200000 IJ SOLN
INTRAMUSCULAR | Status: AC
  Filled 2020-07-05: qty 1

## 2020-07-05 MED ORDER — FENTANYL CITRATE (PF) 100 MCG/2ML IJ SOLN: 25.0000 ug | INTRAMUSCULAR | Status: DC | PRN

## 2020-07-05 MED ORDER — TRAMADOL HCL 50 MG PO TABS: 50.0000 mg | ORAL_TABLET | Freq: Four times a day (QID) | ORAL | 0 refills | Status: AC | PRN

## 2020-07-05 MED ORDER — 0.9 % SODIUM CHLORIDE (POUR BTL) OPTIME
TOPICAL | Status: DC | PRN
  Administered 2020-07-05: 1000 mL

## 2020-07-05 MED ORDER — LIDOCAINE HCL (CARDIAC) PF 100 MG/5ML IV SOSY
PREFILLED_SYRINGE | INTRAVENOUS | Status: DC | PRN
  Administered 2020-07-05: 100 mg via INTRAVENOUS

## 2020-07-05 MED ORDER — DIBUCAINE (PERIANAL) 1 % EX OINT
TOPICAL_OINTMENT | CUTANEOUS | Status: AC
  Filled 2020-07-05: qty 28

## 2020-07-05 MED ORDER — BUPIVACAINE-EPINEPHRINE 0.25% -1:200000 IJ SOLN
INTRAMUSCULAR | Status: DC | PRN
  Administered 2020-07-05: 30 mL

## 2020-07-05 MED ORDER — LIDOCAINE 2% (20 MG/ML) 5 ML SYRINGE
INTRAMUSCULAR | Status: AC
  Filled 2020-07-05: qty 5

## 2020-07-05 MED ORDER — OXYCODONE HCL 5 MG/5ML PO SOLN
5.0000 mg | Freq: Once | ORAL | Status: DC | PRN
Start: 2020-07-05 — End: 2020-07-05

## 2020-07-05 MED ORDER — CHLORHEXIDINE GLUCONATE CLOTH 2 % EX PADS: 6.0000 | MEDICATED_PAD | Freq: Once | CUTANEOUS | Status: DC

## 2020-07-05 MED ORDER — MIDAZOLAM HCL 5 MG/5ML IJ SOLN
INTRAMUSCULAR | Status: DC | PRN
  Administered 2020-07-05: 2 mg via INTRAVENOUS

## 2020-07-05 MED ORDER — DEXAMETHASONE SODIUM PHOSPHATE 10 MG/ML IJ SOLN
INTRAMUSCULAR | Status: AC
  Filled 2020-07-05: qty 1

## 2020-07-05 MED ORDER — SODIUM CHLORIDE (PF) 0.9 % IJ SOLN
INTRAMUSCULAR | Status: AC
  Filled 2020-07-05: qty 10

## 2020-07-05 MED ORDER — ROCURONIUM BROMIDE 100 MG/10ML IV SOLN
INTRAVENOUS | Status: DC | PRN
  Administered 2020-07-05: 70 mg via INTRAVENOUS

## 2020-07-05 MED ORDER — PROPOFOL 10 MG/ML IV BOLUS
INTRAVENOUS | Status: DC | PRN
  Administered 2020-07-05: 200 mg via INTRAVENOUS

## 2020-07-05 MED ORDER — LACTATED RINGERS IV SOLN: INTRAVENOUS | Status: DC

## 2020-07-05 MED ORDER — FLEET ENEMA 7-19 GM/118ML RE ENEM: 1.0000 | ENEMA | Freq: Once | RECTAL | Status: DC

## 2020-07-05 MED ORDER — PROPOFOL 10 MG/ML IV BOLUS
INTRAVENOUS | Status: AC
  Filled 2020-07-05: qty 40

## 2020-07-05 MED ORDER — ACETAMINOPHEN 500 MG PO TABS
1000.0000 mg | ORAL_TABLET | ORAL | Status: AC
  Administered 2020-07-05: 1000 mg via ORAL

## 2020-07-05 MED ORDER — FENTANYL CITRATE (PF) 100 MCG/2ML IJ SOLN
INTRAMUSCULAR | Status: AC
  Filled 2020-07-05: qty 2

## 2020-07-05 MED ORDER — BUPIVACAINE LIPOSOME 1.3 % IJ SUSP
INTRAMUSCULAR | Status: AC
  Filled 2020-07-05: qty 20

## 2020-07-05 MED ORDER — ONDANSETRON HCL 4 MG/2ML IJ SOLN
INTRAMUSCULAR | Status: DC | PRN
  Administered 2020-07-05: 4 mg via INTRAVENOUS

## 2020-07-05 MED ORDER — OXYCODONE HCL 5 MG PO TABS
5.0000 mg | ORAL_TABLET | Freq: Once | ORAL | Status: DC | PRN
Start: 2020-07-05 — End: 2020-07-05

## 2020-07-05 MED ORDER — ONABOTULINUMTOXINA 100 UNITS IJ SOLR
INTRAMUSCULAR | Status: AC
  Filled 2020-07-05: qty 100

## 2020-07-05 MED ORDER — ACETAMINOPHEN 500 MG PO TABS
ORAL_TABLET | ORAL | Status: AC
  Filled 2020-07-05: qty 2

## 2020-07-05 MED ORDER — ONDANSETRON HCL 4 MG/2ML IJ SOLN: 4.0000 mg | Freq: Once | INTRAMUSCULAR | Status: DC | PRN

## 2020-07-05 MED ORDER — KETOROLAC TROMETHAMINE 30 MG/ML IJ SOLN: 30.0000 mg | Freq: Once | INTRAMUSCULAR | Status: DC | PRN

## 2020-07-05 MED ORDER — FENTANYL CITRATE (PF) 100 MCG/2ML IJ SOLN
INTRAMUSCULAR | Status: DC | PRN
  Administered 2020-07-05 (×2): 100 ug via INTRAVENOUS

## 2020-07-05 MED ORDER — ONDANSETRON HCL 4 MG/2ML IJ SOLN
INTRAMUSCULAR | Status: AC
  Filled 2020-07-05: qty 2

## 2020-07-05 MED ORDER — METHYLENE BLUE 0.5 % INJ SOLN
INTRAVENOUS | Status: AC
  Filled 2020-07-05: qty 10

## 2020-07-05 MED ORDER — BUPIVACAINE LIPOSOME 1.3 % IJ SUSP
INTRAMUSCULAR | Status: DC | PRN
  Administered 2020-07-05: 20 mL

## 2020-07-05 MED ORDER — METHYLENE BLUE 0.5 % INJ SOLN
INTRAVENOUS | Status: DC | PRN
  Administered 2020-07-05: 5 mL via INTRADERMAL

## 2020-07-05 MED ORDER — MEPERIDINE HCL 25 MG/ML IJ SOLN: 6.2500 mg | INTRAMUSCULAR | Status: DC | PRN

## 2020-07-05 MED ORDER — KETOROLAC TROMETHAMINE 30 MG/ML IJ SOLN
INTRAMUSCULAR | Status: AC
  Filled 2020-07-05: qty 1

## 2020-07-05 MED ORDER — KETOROLAC TROMETHAMINE 30 MG/ML IJ SOLN
INTRAMUSCULAR | Status: DC | PRN
  Administered 2020-07-05: 30 mg via INTRAVENOUS

## 2020-07-05 MED ORDER — DEXAMETHASONE SODIUM PHOSPHATE 4 MG/ML IJ SOLN
INTRAMUSCULAR | Status: DC | PRN
  Administered 2020-07-05: 10 mg via INTRAVENOUS

## 2020-07-05 MED ORDER — SUGAMMADEX SODIUM 200 MG/2ML IV SOLN
INTRAVENOUS | Status: DC | PRN
  Administered 2020-07-05: 200 mg via INTRAVENOUS

## 2020-07-05 MED ORDER — BUPIVACAINE LIPOSOME 1.3 % IJ SUSP: 20.0000 mL | Freq: Once | INTRAMUSCULAR | Status: DC

## 2020-07-05 MED ORDER — ROCURONIUM BROMIDE 10 MG/ML (PF) SYRINGE
PREFILLED_SYRINGE | INTRAVENOUS | Status: AC
  Filled 2020-07-05: qty 10

## 2020-07-05 MED ORDER — MIDAZOLAM HCL 2 MG/2ML IJ SOLN
INTRAMUSCULAR | Status: AC
  Filled 2020-07-05: qty 2

## 2020-07-05 SURGICAL SUPPLY — 62 items
APL SKNCLS STERI-STRIP NONHPOA (GAUZE/BANDAGES/DRESSINGS) ×4
BENZOIN TINCTURE PRP APPL 2/3 (GAUZE/BANDAGES/DRESSINGS) ×6 IMPLANT
BLADE EXTENDED COATED 6.5IN (ELECTRODE) IMPLANT
BLADE HEX COATED 2.75 (ELECTRODE) IMPLANT
BLADE SURG 10 STRL SS (BLADE) IMPLANT
BLADE SURG 15 STRL LF DISP TIS (BLADE) ×2 IMPLANT
BLADE SURG 15 STRL SS (BLADE) ×3
BRIEF STRETCH FOR OB PAD LRG (UNDERPADS AND DIAPERS) ×3 IMPLANT
COVER BACK TABLE 60X90IN (DRAPES) ×3 IMPLANT
COVER MAYO STAND STRL (DRAPES) ×3 IMPLANT
COVER WAND RF STERILE (DRAPES) ×3 IMPLANT
DECANTER SPIKE VIAL GLASS SM (MISCELLANEOUS) ×3 IMPLANT
DRAPE HYSTEROSCOPY (MISCELLANEOUS) IMPLANT
DRAPE LAPAROTOMY 100X72 PEDS (DRAPES) ×3 IMPLANT
DRAPE SHEET LG 3/4 BI-LAMINATE (DRAPES) IMPLANT
DRAPE UTILITY XL STRL (DRAPES) ×3 IMPLANT
DRSG PAD ABDOMINAL 8X10 ST (GAUZE/BANDAGES/DRESSINGS) ×3 IMPLANT
GAUZE SPONGE 4X4 12PLY STRL (GAUZE/BANDAGES/DRESSINGS) ×3 IMPLANT
GLOVE SURG ENC MOIS LTX SZ7.5 (GLOVE) ×3 IMPLANT
GLOVE SURG UNDER LTX SZ8 (GLOVE) ×3 IMPLANT
GOWN STRL REUS W/TWL LRG LVL3 (GOWN DISPOSABLE) ×3 IMPLANT
HYDROGEN PEROXIDE 16OZ (MISCELLANEOUS) IMPLANT
IV CATH 14GX2 1/4 (CATHETERS) IMPLANT
IV CATH 18G SAFETY (IV SOLUTION) IMPLANT
KIT SIGMOIDOSCOPE (SET/KITS/TRAYS/PACK) IMPLANT
KIT TURNOVER CYSTO (KITS) ×3 IMPLANT
LEGGING LITHOTOMY PAIR STRL (DRAPES) IMPLANT
LOOP VESSEL MAXI BLUE (MISCELLANEOUS) IMPLANT
NDL SAFETY ECLIPSE 18X1.5 (NEEDLE) IMPLANT
NEEDLE HYPO 18GX1.5 SHARP (NEEDLE)
NEEDLE HYPO 22GX1.5 SAFETY (NEEDLE) ×3 IMPLANT
NEEDLE HYPO 25X1 1.5 SAFETY (NEEDLE) IMPLANT
NS IRRIG 500ML POUR BTL (IV SOLUTION) ×3 IMPLANT
PACK BASIN DAY SURGERY FS (CUSTOM PROCEDURE TRAY) ×3 IMPLANT
PENCIL SMOKE EVACUATOR (MISCELLANEOUS) ×3 IMPLANT
SPONGE HEMORRHOID 8X3CM (HEMOSTASIS) IMPLANT
SPONGE SURGIFOAM ABS GEL 12-7 (HEMOSTASIS) IMPLANT
SUCTION FRAZIER HANDLE 10FR (MISCELLANEOUS)
SUCTION TUBE FRAZIER 10FR DISP (MISCELLANEOUS) IMPLANT
SUT CHROMIC 2 0 SH (SUTURE) IMPLANT
SUT CHROMIC 3 0 SH 27 (SUTURE) IMPLANT
SUT MNCRL AB 4-0 PS2 18 (SUTURE) IMPLANT
SUT SILK 0 PSL NDL (SUTURE) IMPLANT
SUT SILK 0 TIES 10X30 (SUTURE) IMPLANT
SUT SILK 2 0 (SUTURE)
SUT SILK 2-0 18XBRD TIE 12 (SUTURE) IMPLANT
SUT VIC AB 2-0 SH 27 (SUTURE)
SUT VIC AB 2-0 SH 27XBRD (SUTURE) IMPLANT
SUT VIC AB 3-0 SH 18 (SUTURE) IMPLANT
SUT VIC AB 3-0 SH 27 (SUTURE)
SUT VIC AB 3-0 SH 27X BRD (SUTURE) IMPLANT
SUT VIC AB 3-0 SH 27XBRD (SUTURE) IMPLANT
SUT VIC AB 4-0 P-3 18XBRD (SUTURE) IMPLANT
SUT VIC AB 4-0 P3 18 (SUTURE)
SYR 20ML LL LF (SYRINGE) IMPLANT
SYR BULB IRRIG 60ML STRL (SYRINGE) ×3 IMPLANT
SYR CONTROL 10ML LL (SYRINGE) ×3 IMPLANT
SYR TB 1ML LL NO SAFETY (SYRINGE) IMPLANT
TOWEL OR 17X26 10 PK STRL BLUE (TOWEL DISPOSABLE) ×3 IMPLANT
TRAY DSU PREP LF (CUSTOM PROCEDURE TRAY) ×3 IMPLANT
TUBE CONNECTING 12X1/4 (SUCTIONS) ×3 IMPLANT
YANKAUER SUCT BULB TIP NO VENT (SUCTIONS) ×3 IMPLANT

## 2020-07-05 NOTE — H&P (Addendum)
CC: Referred for evaluation of possible anal fistula - long-term follow-up  HPI: Alexandra Christensen is a very pleasant 49yoF with hx of DM, HTN and one year ago developed swelling on her left buttock she reports the size of a baseball and was drained. She has since had intermittent drainage from this location. There last week or so she's also developed swelling on the right side of her buttock. She states she was seen by her PCP, Dr. Rubin Payor, and performed a incision and drainage but she reports he only got a small amount of pus but some blood out. She has since had continued swelling on her right buttock. She reports this feels like a large ball that she is sitting on. She has been taking Bactrim last week but finished her course yesterday. She denies fevers. She has had some chills. She denies nausea/vomiting/abdominal pain. She reports that [facility] colonoscopy approximately 8 years ago with Dr. Elnoria Howard and she is unsure what the findings were.  OR 11/03/19 - EUA, partial fistulotomy. She was found to have communication between the wounds on her right and left buttocks - despite multiple attempts with fistula probe and peroxide, there was no definitive fistula proven to anal canal - if she has a fistula, would likely be anterior midline.  INTERVAL HX She denies any changes in her health or health history. 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  Past Medical History:  Diagnosis Date  . Family history of adverse reaction to  anesthesia    mother-- hard to wake  . History of colitis 2011  . Hyperlipidemia   . Hypertension    followed by pcp  . Perirectal abscess    recurrent  . 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 Type 2 diabetes mellitus (HCC)    followed by pcp   (06-29-2020 pt stated does not check blood sugar at home)    Past Surgical History:  Procedure Laterality Date  . ANAL FISTULOTOMY N/A 11/03/2019   Procedure: PARTIAL ANAL FISTULOTOMY;  Surgeon: Andria Meuse, MD;  Location: [facility];  Service: General;  Laterality: N/A;  . INCISION AND DRAINAGE ABSCESS ANAL    . RECTAL EXAM UNDER ANESTHESIA N/A 11/03/2019   Procedure: RECTAL EXAM UNDER ANESTHESIA;  Surgeon: Andria Meuse, MD;  Location: [facility];  Service: General;  Laterality: N/A;  . TEE WITHOUT CARDIOVERSION N/A 06/16/2013   Procedure: TRANSESOPHAGEAL ECHOCARDIOGRAM (TEE);  Surgeon: Chrystie Nose, MD;  Location: [facility];  Service: Cardiovascular;  Laterality: N/A;  . WISDOM TOOTH EXTRACTION      Family History  Problem Relation Age of Onset  . Diabetes Mother   . Heart disease Mother   . Kidney disease Father   . Heart disease Father   . Stroke Sister 68  . Cancer Sister        AML    Social:  reports that she has  been smoking cigarettes. She has a 15.00 pack-year smoking history. She has never used smokeless tobacco. She reports previous alcohol use. She reports that she does not use drugs.  Allergies:  Allergies  Allergen Reactions  . Adhesive [Tape] Rash    Medications: I have reviewed the patient's current medications.  Results for orders placed or performed during the hospital encounter of 07/05/20 (from the past 48 hour(s))  Pregnancy, urine POC     Status: None   Collection Time: 07/05/20  6:59 AM  Result Value Ref Range   Preg Test, Ur NEGATIVE NEGATIVE    Comment:         THE SENSITIVITY OF THIS METHODOLOGY IS >24 mIU/mL   I-STAT, chem 8     Status: Abnormal   Collection Time: 07/05/20  7:26 AM  Result Value Ref Range   Sodium 140 135 - 145 mmol/L   Potassium 4.5 3.5 - 5.1 mmol/L   Chloride 103 98 - 111 mmol/L   BUN 20 6 - 20 mg/dL   Creatinine, Ser 3.14 0.44 - 1.00 mg/dL   Glucose, Bld 970 (H) 70 - 99 mg/dL    Comment: Glucose reference range applies only to samples taken after fasting for at least 8 hours.   Calcium, Ion 1.32 1.15 - 1.40 mmol/L   TCO2 27 22 - 32 mmol/L   Hemoglobin 16.3 (H) 12.0 - 15.0 g/dL   HCT 26.3 (H) 78.5 - 88.5 %    No results found.  ROS - all of the below systems have been reviewed with the patient and positives are indicated with bold text General: chills, fever or night sweats Eyes: blurry vision or double vision ENT: epistaxis or sore throat Allergy/Immunology: itchy/watery eyes or nasal congestion Hematologic/Lymphatic: bleeding problems, blood clots or swollen lymph nodes Endocrine: temperature intolerance or unexpected weight changes Breast: new or changing breast lumps or nipple discharge Resp: cough, shortness of breath, or wheezing CV: chest pain or dyspnea on exertion GI: as per HPI GU: dysuria, trouble voiding, or hematuria MSK: joint pain or joint stiffness Neuro: TIA or stroke symptoms Derm: pruritus and skin lesion changes Psych: anxiety and depression  PE Blood pressure 140/83, pulse 79, temperature 98.4 F (36.9 C), temperature source Oral, resp. rate 17, height 5\' 6"  (1.676 m), weight 100.5 kg, last menstrual period 08/31/2019, SpO2 96 %. Constitutional: NAD; conversant Eyes: Moist conjunctiva; no lid lag; anicteric Lungs: Normal respiratory effort CV: RRR GI: Abd soft, NT/ND MSK: Normal range of motion of extremities Psychiatric: Appropriate affect; alert and oriented x3 Results for orders placed or performed during the hospital encounter of 07/05/20 (from the past 48 hour(s))  Pregnancy,  urine POC     Status: None   Collection Time: 07/05/20  6:59 AM  Result Value Ref Range   Preg Test, Ur NEGATIVE NEGATIVE    Comment:        THE SENSITIVITY OF THIS METHODOLOGY IS >24 mIU/mL   I-STAT, chem 8     Status: Abnormal   Collection Time: 07/05/20  7:26 AM  Result Value Ref Range   Sodium 140 135 - 145 mmol/L   Potassium 4.5 3.5 - 5.1 mmol/L   Chloride 103 98 - 111 mmol/L   BUN 20 6 - 20 mg/dL   Creatinine, Ser 07/07/20 0.44 - 1.00 mg/dL   Glucose, Bld 0.27 (H) 70 - 99 mg/dL    Comment: Glucose reference range applies only to samples taken after fasting for at least 8 hours.   Calcium,  Ion 1.32 1.15 - 1.40 mmol/L   TCO2 27 22 - 32 mmol/L   Hemoglobin 16.3 (H) 12.0 - 15.0 g/dL   HCT 20.9 (H) 47.0 - 96.2 %    No results found.   A/P: Alexandra Christensen is an 50 y.o. female with s. Baumgarten is a very pleasant 49yoF with hx of HTN, DM - left and right buttock fistulas - recently with abscess at a closed prior external opening on the right  -The anatomy and physiology of the anal canal was discussed with the patient. The pathophysiology of anal abscess and fistula was discussed as well -Plan for anorectal exam under anesthesia, possible incision/drainage and re-attempt at seton placement -The planned procedures, material risks (including, but not limited to, pain, bleeding, infection, scarring, need for blood transfusion, damage to anal sphincter, incontinence of gas and/or stool, need for additional procedures, recurrence, again failure to identify internal opening, pneumonia, heart attack, stroke, death) benefits and alternatives to surgery were discussed at length. The patient's questions were answered to her satisfaction, she voiced understanding and elected to proceed with surgery. Additionally, we discussed typical postoperative expectations and the recovery process. -She has requested I update her husband, Alexandra Christensen, by phone after the procedure  Stephanie Coup. Cliffton Asters, M.D. [facility] Use [email or web address] to contact on call provider

## 2020-07-05 NOTE — Discharge Instructions (Addendum)
ANORECTAL SURGERY: POST OP INSTRUCTIONS  We were able to successfully identify a horse-shoe configuration anal fistula. 2 separate setons were placed which connect to a shared internal opening. One is on the right and one on the left. These will remain in place for the near future and help reduce the probability that you develop another abscess. They will allow things to drain on their own. Therefore, protecting your clothing with a pad or guaze is often helpful but the drainage should be rather minimal long-term. Sometimes, patients may develop skin irritation where the seton exits the wounds and this is related to chronic moisture exposure. Many patients find it helpful to apply a water-barrier cream to protect the skin - Calmoseptine (available at most pharmacies) is a great cream to apply to protect the skin.  In preparation for any subsequent procedures, the most helpful thing you can do to help with success of fistula healing is to quit smoking and avoid nicotine containing products.  1. DIET: Follow a light bland diet the first 24 hours after arrival home, such as soup, liquids, crackers, etc.  Be sure to include lots of fluids daily.  Avoid fast food or heavy meals as your are more likely to get nauseated.  Eat a low fat diet the next few days after surgery.   2. Some bleeding with bowel movements is expected for the first couple of days but this should stop in between bowel movements  3. Take your usually prescribed home medications unless otherwise directed.  4. PAIN CONTROL: a. It is helpful to take an over-the-counter pain medication regularly for the first few days/weeks.  Choose from the following that works best for you: i. Ibuprofen (Advil, etc) Three 200mg  tabs every 6 hours as needed. ii. Acetaminophen (Tylenol, etc) 500-650mg  every 6 hours as needed iii. NOTE: You may take both of these medications together - most patients find it most helpful when alternating between the two (i.e.  Ibuprofen at 6am, tylenol at 9am, ibuprofen at 12pm ...) b. A  prescription for pain medication may have been prescribed for you at discharge.  Take your pain medication as prescribed.  i. If you are having problems/concerns with the prescription medicine, please call for further advice.  5. Avoid getting constipated.  Between the surgery and the pain medications, it is common to experience some constipation.  Increasing fluid intake (64oz of water per day) and taking a fiber supplement (such as Metamucil, Citrucel, FiberCon) 1-2 times a day regularly will usually help prevent this problem from occurring.  Take Miralax (over the counter) 1-2x/day while taking a narcotic pain medication. If no bowel movement after 48hours, you may additionally take a laxative like a bottle of Milk of Magnesia which can be purchased over the counter. Avoid enemas if possible as these are often painful.   6. Watch out for diarrhea.  If you have many loose bowel movements, simplify your diet to bland foods.  Stop any stool softeners and decrease your fiber supplement. If this worsens or does not improve, please call us.  7. Wash / shower every day.  If you were discharged with a dressing, you may remove this later today or tomorrow. You may shower normally, getting soap/water on your wound, particularly after bowel movements.  8. ACTIVITIES as tolerated:   a. You may resume regular (light) daily activities beginning the next day--such as daily self-care, walking, climbing stairs--gradually increasing activities as tolerated.  If you can walk 30 minutes without difficulty, it  is safe to try more intense activity such as jogging, treadmill, bicycling, low-impact aerobics, etc. b. Refrain from any heavy lifting or straining for the first 2 weeks after your procedure, particularly if your surgery was for hemorrhoids. c. Avoid activities that make your pain worse d. You may drive when you are no longer taking prescription  pain medication, you can comfortably wear a seatbelt, and you can safely maneuver your car and apply brakes.  9. FOLLOW UP in our office a. Please call [facility] at [phone] to set up an appointment to see your surgeon in the office for a follow-up appointment approximately 2-4 weeks after your surgery. b. Make sure that you call for this appointment the day you arrive home to insure a convenient appointment time.  9. If you have disability or family leave forms that need to be completed, you may have them completed by your primary care physician's office; for return to work instructions, please ask our office staff and they will be happy to assist you in obtaining this documentation   When to call us [phone]: 1. Poor pain control 2. Reactions / problems with new medications (rash/itching, etc)  3. Fever over 101.5 F (38.5 C) 4. Inability to urinate 5. Nausea/vomiting 6. Worsening swelling or bruising 7. Continued bleeding from incision. 8. Increased pain, redness, or drainage from the incision  The clinic staff is available to answer your questions during regular business hours (8:30am-5pm).  Please don't hesitate to call and ask to speak to one of our nurses for clinical concerns.   A surgeon from [facility] is always on call at the hospitals   If you have a medical emergency, go to the nearest emergency room or call 911.   [facility] [address], [place], [place]  [address] ? MAIN: [phone] FAX [phone] [email or web address]   Post Anesthesia Home Care Instructions  Activity: Get plenty of rest for the remainder of the day. A responsible adult should stay with you for 24 hours following the procedure.  For the next 24 hours, DO NOT: -Drive a car -Advertising copywriter -Drink alcoholic beverages -Take any medication unless instructed by your physician -Make any legal decisions or sign important  papers.  Meals: Start with liquid foods such as gelatin or soup. Progress to regular foods as tolerated. Avoid greasy, spicy, heavy foods. If nausea and/or vomiting occur, drink only clear liquids until the nausea and/or vomiting subsides. Call your physician if vomiting continues.  Special Instructions/Symptoms: Your throat may feel dry or sore from the anesthesia or the breathing tube placed in your throat during surgery. If this causes discomfort, gargle with warm salt water. The discomfort should disappear within 24 hours.

## 2020-07-05 NOTE — Transfer of Care (Signed)
Immediate Anesthesia Transfer of Care Note  Patient: Alexandra Christensen  Procedure(s) Performed: Procedure(s) (LRB): INCISION AND DRAINAGE PERIRECTAL ABSCESS & PLACEMENT OF SETON (N/A) EXAM UNDER ANESTHESIA (N/A)  Patient Location: PACU  Anesthesia Type: General  Level of Consciousness: awake, sedated, patient cooperative and responds to stimulation  Airway & Oxygen Therapy: Patient Spontanous Breathing and Patient on RA with soft FM  Post-op Assessment: Report given to PACU RN, Post -op Vital signs reviewed and stable and Patient moving all extremities  Post vital signs: Reviewed and stable  Complications: No apparent anesthesia complications

## 2020-07-05 NOTE — Op Note (Addendum)
07/05/2020  10:09 AM  PATIENT:  Alexandra Christensen  50 y.o. female  Patient Care Team: Pcp, No as PCP - General  PRE-OPERATIVE DIAGNOSIS:  Anal fistula; history of perirectal abscesses  POST-OPERATIVE DIAGNOSIS:  1. Horseshoe transsphincteric anal fistula, anteriorly based 2. Left perirectal abscess  PROCEDURE:   1. Incision and drainage of left perirectal abscess 2. Placement of left gluteal draining seton 3. Placement of right gluteal draining seton 4. Anorectal exam under anesthesia  SURGEON:  Surgeon(s): Ileana Roup, MD   ANESTHESIA:   local and general  SPECIMEN:  No Specimen  DISPOSITION OF SPECIMEN:  N/A  COUNTS:  Sponge, needle, and instrument counts were reported correct x2 at conclusion.  EBL: 5 mL  Drains: Draining setons  PLAN OF CARE: Discharge to home after PACU  PATIENT DISPOSITION:  PACU - hemodynamically stable.  INDICATION: [name] is a very pleasant 27yoF with history of recurrent perirectal abscesses.  She has previously been taken to the operating room back in September 2021 with suspected anal fistulas.  She was found to have a fistula connecting both the left and right buttocks but with peroxide instillation and meticulous exam in the operating room, no clear communication with the anal canal could be proven.  Ultimately she developed recurrent perianal/perirectal abscesses particular in the left side.  We had met back up with her in the office after she was initially lost to follow-up to discuss options further.  We discussed repeat examination in the operating room to further delineate whether or not there is an identifiable fistula.  OR FINDINGS: Left perirectal abscess containing approximately 5 cc of purulent fluid.  External opening on the left and right buttocks which communicate with 1 another.  With dilute methylene blue instillation comments also.  She has an anterior midline transsphincteric anal fistula with a horseshoe type  configuration.  2 separate draining Vesseloops were placed.  The tract courses through the rectovaginal septum.  DESCRIPTION: The patient was identified in the preoperative holding area and taken to the OR. SCDs were applied. She then underwent general endotracheal anesthesia without difficulty. The patient was then rolled onto the OR table in the prone jackknife position. Pressure points were then evaluated and padded. Benzoin was applied to the buttocks and they were gently taped apart.  She was then prepped and draped in usual sterile fashion.  A surgical timeout was performed indicating the correct patient, procedure, and positioning.  A perianal block was then created using a dilute mixture of 0.25% Marcaine with epinephrine and Exparel.  After ascertaining an appropriate level of anesthesia had been achieved, a well lubricated digital rectal exam was performed. This demonstrated no palpable abnormalities.  A Hill-Ferguson anoscope was into the anal canal and circumferential inspection demonstrated some granulation tissue in the anterior midline of the anal canal at the level of the dentate line.    Externally, there are 2 pinpoint sized openings consistent with external openings of an anal fistula on both the anterior left and right buttocks.  The left side has a grape sized fluctuant component associated with it.  This is incised and approximately 5 cc of pus is evacuated.  Using dilute methylene blue, an angiocatheter was used to inject to the external opening on the left side.  This clearly communicates with the right side and a small amount of methylene blue was also evident in the anterior midline anal canal.  A fistula probe was inserted and the tract carefully explored working to avoid creating  any false passages.  From the left side, were able to prove connection with the anterior midline anal canal.  This is at the level of the dentate.  A blue vessel loop is then used to create a draining  seton.  This is secured to itself with 2-0 silk suture.  The right side is also then carefully explored.  Were able to also identify a tract emanating into the anal canal with a shared common internal opening.  A draining blue vessel loop seton was placed to this tract as well.  This is secured to itself with 2-0 silk.  The anal canal is inspected and hemostasis appreciated.  Additional local anesthetic is infiltrated. All counts are reported correct. A dressing consisting of 4 x 4's, ABD, mesh underwear was placed after the buttocks tape was released.  She is then rolled back onto a stretcher, awakened from anesthesia, extubated, and transported to PACU in satisfactory condition.  Ultimately, would strongly advise she work to quit smoking before attempting any further fistula type procedures particularly given that this is a horseshoe configuration.  At the next procedure, would consider either a modified Hanley type procedure versus an endorectal advancement flap. I do not think based on the tract she would be a good candidate for a LIFT procedure.   DISPOSITION: PACU in satisfactory condition.

## 2020-07-05 NOTE — Anesthesia Postprocedure Evaluation (Signed)
Anesthesia Post Note  Patient: Alexandra Christensen  Procedure(s) Performed: INCISION AND DRAINAGE PERIRECTAL ABSCESS & PLACEMENT OF SETON (N/A Rectum) EXAM UNDER ANESTHESIA (N/A )     Patient location during evaluation: Phase II Anesthesia Type: General Level of consciousness: awake Pain management: pain level controlled Vital Signs Assessment: post-procedure vital signs reviewed and stable Respiratory status: spontaneous breathing Cardiovascular status: stable Postop Assessment: no apparent nausea or vomiting Anesthetic complications: no   No complications documented.  Last Vitals:  Vitals:   07/05/20 1015 07/05/20 1030  BP: 130/81 136/86  Pulse: 66 69  Resp: 13 14  Temp:    SpO2: 94% 95%    Last Pain:  Vitals:   07/05/20 0956  TempSrc:   PainSc: 0-No pain                 Caren Macadam

## 2020-07-05 NOTE — Anesthesia Procedure Notes (Signed)
Procedure Name: Intubation Date/Time: 07/05/2020 8:41 AM Performed by: Justice Rocher, CRNA Pre-anesthesia Checklist: Patient identified, Emergency Drugs available, Suction available, Patient being monitored and Timeout performed Patient Re-evaluated:Patient Re-evaluated prior to induction Oxygen Delivery Method: Circle system utilized Preoxygenation: Pre-oxygenation with 100% oxygen Induction Type: IV induction Ventilation: Mask ventilation without difficulty Laryngoscope Size: Mac and 4 Grade View: Grade II Tube type: Oral Tube size: 7.5 mm Number of attempts: 1 Airway Equipment and Method: Stylet and Oral airway Placement Confirmation: ETT inserted through vocal cords under direct vision,  positive ETCO2,  breath sounds checked- equal and bilateral and CO2 detector Secured at: 23 cm Tube secured with: Tape Dental Injury: Teeth and Oropharynx as per pre-operative assessment

## 2020-07-05 NOTE — Anesthesia Preprocedure Evaluation (Addendum)
Anesthesia Evaluation  Patient identified by MRN, date of birth, ID band Patient awake    Reviewed: Allergy & Precautions, NPO status , Patient's Chart, lab work & pertinent test results  Airway Mallampati: III  TM Distance: >3 FB Neck ROM: Full    Dental no notable dental hx. (+) Teeth Intact, Dental Advisory Given,    Pulmonary Current Smoker and Patient abstained from smoking.,  5-6cigg/d x 35 years No inhalers   Pulmonary exam normal breath sounds clear to auscultation       Cardiovascular hypertension, Pt. on medications Normal cardiovascular exam Rhythm:Regular Rate:Normal     Neuro/Psych  Headaches, PSYCHIATRIC DISORDERS Anxiety    GI/Hepatic negative GI ROS, Neg liver ROS, Anal fistula   Endo/Other  diabetes, Well Controlled, Type 2, Oral Hypoglycemic AgentsObesity BMI 37  Renal/GU negative Renal ROS  negative genitourinary   Musculoskeletal negative musculoskeletal ROS (+)   Abdominal (+) + obese,   Peds negative pediatric ROS (+)  Hematology negative hematology ROS (+)   Anesthesia Other Findings   Reproductive/Obstetrics negative OB ROS                             Anesthesia Physical  Anesthesia Plan  ASA: III  Anesthesia Plan: General   Post-op Pain Management:    Induction: Intravenous  PONV Risk Score and Plan: 2 and Ondansetron, Dexamethasone, Midazolam and Treatment may vary due to age or medical condition  Airway Management Planned: Oral ETT  Additional Equipment: None  Intra-op Plan:   Post-operative Plan: Extubation in OR  Informed Consent: I have reviewed the patients History and Physical, chart, labs and discussed the procedure including the risks, benefits and alternatives for the proposed anesthesia with the patient or authorized representative who has indicated his/her understanding and acceptance.     Dental advisory given  Plan Discussed  with: CRNA  Anesthesia Plan Comments:        Anesthesia Quick Evaluation

## 2020-07-06 ENCOUNTER — Encounter (HOSPITAL_BASED_OUTPATIENT_CLINIC_OR_DEPARTMENT_OTHER): Payer: Self-pay | Admitting: Surgery

## 2020-08-24 ENCOUNTER — Ambulatory Visit: Payer: Self-pay | Admitting: Family Medicine

## 2020-08-31 ENCOUNTER — Encounter: Payer: Self-pay | Admitting: Family Medicine

## 2020-08-31 ENCOUNTER — Encounter: Payer: Self-pay | Admitting: *Deleted

## 2020-08-31 ENCOUNTER — Other Ambulatory Visit: Payer: Self-pay

## 2020-08-31 ENCOUNTER — Ambulatory Visit (INDEPENDENT_AMBULATORY_CARE_PROVIDER_SITE_OTHER): Payer: BC Managed Care – PPO | Admitting: Family Medicine

## 2020-08-31 VITALS — BP 142/88 | HR 74 | Temp 98.7°F | Resp 14 | Ht 67.0 in | Wt 222.0 lb

## 2020-08-31 DIAGNOSIS — E781 Pure hyperglyceridemia: Secondary | ICD-10-CM

## 2020-08-31 DIAGNOSIS — E1149 Type 2 diabetes mellitus with other diabetic neurological complication: Secondary | ICD-10-CM

## 2020-08-31 DIAGNOSIS — I10 Essential (primary) hypertension: Secondary | ICD-10-CM | POA: Diagnosis not present

## 2020-08-31 MED ORDER — OZEMPIC (0.25 OR 0.5 MG/DOSE) 2 MG/1.5ML ~~LOC~~ SOPN: 0.5000 mg | PEN_INJECTOR | SUBCUTANEOUS | 3 refills | Status: DC

## 2020-08-31 NOTE — Progress Notes (Signed)
Subjective:    Patient ID: Alexandra Christensen, female    DOB: [birth year], 50 y.o.   MRN: [id number]  HPI Patient presents today for a follow-up of her diabetes.  She is currently on Ozempic 0.5 mg subcu weekly.  She denies any nausea or vomiting.  She denies any chest pain shortness of breath or dyspnea on exertion.  Blood pressure today is borderline at 142/88 however she states that she was nervous and upset before coming to the office today.  She is not been checking her blood pressure at home.  She denies any syncope or near syncope.  She does have neuropathy in her legs for which she takes gabapentin.  Diabetic foot exam is significant for diminished sensation to 10 g monofilament although she is still able to feel it in every toe.  She has normal pulses. 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,  pt had TEE done 06-16-2013 normal;  (06-30-2020  no issue since and told unknown cause)   Type 2 diabetes mellitus ([place])    followed by pcp   (06-29-2020 pt stated does not check blood sugar at home)   Past Surgical History:  Procedure Laterality Date   ANAL FISTULOTOMY N/A 11/03/2019   Procedure: PARTIAL ANAL FISTULOTOMY;  Surgeon: Ileana Roup, MD;  Location: [facility];  Service: General;  Laterality: N/A;   INCISION AND DRAINAGE ABSCESS N/A 07/05/2020   Procedure: INCISION AND DRAINAGE PERIRECTAL ABSCESS & PLACEMENT OF SETON;  Surgeon: Ileana Roup, MD;  Location: [facility];  Service: General;  Laterality: N/A;   INCISION AND DRAINAGE ABSCESS ANAL     RECTAL EXAM UNDER ANESTHESIA N/A 11/03/2019   Procedure: RECTAL EXAM UNDER ANESTHESIA;  Surgeon: Ileana Roup, MD;  Location: [facility];  Service:  General;  Laterality: N/A;   TEE WITHOUT CARDIOVERSION N/A 06/16/2013   Procedure: TRANSESOPHAGEAL ECHOCARDIOGRAM (TEE);  Surgeon: Pixie Casino, MD;  Location: [facility];  Service: Cardiovascular;  Laterality: N/A;   WISDOM TOOTH EXTRACTION     Current Outpatient Medications on File Prior to Visit  Medication Sig Dispense Refill   ALPRAZolam (XANAX) 1 MG tablet Take 1 tablet (1 mg total) by mouth at bedtime as needed. for sleep 60 tablet 1   atorvastatin (LIPITOR) 20 MG tablet Take 1 tablet (20 mg total) by mouth daily. (Patient taking differently: Take 20 mg by mouth daily.) 90 tablet 3   Blood Glucose Monitoring Suppl (BLOOD GLUCOSE SYSTEM PAK) KIT Dispense based on patient and insurance preference. Use to monitor FSBS 1x daily. Dx: E11.9 1 each 1   gabapentin (NEURONTIN) 300 MG capsule TAKE 1 CAPSULE BY MOUTH IN THE MORNING AND 2 AT BEDTIME (Patient taking differently: Take 300 mg by mouth 2 (two) times daily. TAKE 1 CAPSULE BY MOUTH IN THE MORNING AND 2 AT BEDTIME) 270 capsule 3   glucose blood (ONE TOUCH ULTRA TEST) test strip USE TO CHECK FASTING BLOOD GLUCOSE ONCE DAILY 50 each 3   ibuprofen (ADVIL) 800 MG tablet TAKE 1 TABLET BY MOUTH EVERY 8 HOURS AS NEEDED 90 tablet 0   Lancet Devices MISC Dispense based on patient and  insurance preference. Use to monitor FSBS 1x daily. Dx: E11.9 1 each 1   Lancets MISC Dispense based on patient and insurance preference. Use to monitor FSBS 1x daily. Dx: E11.9 100 each 1   losartan (COZAAR) 25 MG tablet Take 1 tablet (25 mg total) by mouth daily. (Patient taking differently: Take 25 mg by mouth daily.) 90 tablet 2   OZEMPIC, 0.25 OR 0.5 MG/DOSE, 2 MG/1.5ML SOPN INJECT 0.25MG INTO THE SKIN ONCE A WEEK (Patient taking differently: 0.5 mg once a week. Monday's) 6 mL 0   No current facility-administered medications on file prior to visit.   Allergies  Allergen Reactions   Adhesive [Tape] Rash    Social History   Socioeconomic History   Marital  status: Single    Spouse name: Not on file   Number of children: Not on file   Years of education: Not on file   Highest education level: Not on file  Occupational History   Not on file  Tobacco Use   Smoking status: Every Day    Packs/day: 0.50    Years: 30.00    Pack years: 15.00    Types: Cigarettes   Smokeless tobacco: Never   Tobacco comments:    06-30-2020  pt stated 3ppwk  Vaping Use   Vaping Use: Never used  Substance and Sexual Activity   Alcohol use: Not Currently    Comment: occasionally   Drug use: No   Sexual activity: Yes  Other Topics Concern   Not on file  Social History Narrative   Not on file   Social Determinants of Health   Financial Resource Strain: Not on file  Food Insecurity: Not on file  Transportation Needs: Not on file  Physical Activity: Not on file  Stress: Not on file  Social Connections: Not on file  Intimate Partner Violence: Not on file   Family History  Problem Relation Age of Onset   Diabetes Mother    Heart disease Mother    Kidney disease Father    Heart disease Father    Stroke Sister 85   Cancer Sister        AML      Review of Systems  All other systems reviewed and are negative.     Objective:   Physical Exam Vitals reviewed.  Cardiovascular:     Rate and Rhythm: Normal rate and regular rhythm.     Heart sounds: Normal heart sounds.  Pulmonary:     Effort: Pulmonary effort is normal. No respiratory distress.     Breath sounds: Normal breath sounds. No stridor. No wheezing or rales.  Abdominal:     General: Bowel sounds are normal. There is no distension.     Palpations: Abdomen is soft. There is no mass.     Tenderness: There is no abdominal tenderness. There is no guarding or rebound.          Assessment & Plan:  Essential hypertension  Type 2 diabetes mellitus with neurological complications ([place]) - Plan: Hemoglobin A1c, CBC with Differential/Platelet, COMPLETE METABOLIC PANEL WITH GFR, Lipid  panel, Microalbumin, urine  Hypertriglyceridemia Blood pressure today is borderline.  Of asked the patient to check her blood pressure every day and notify me of the values.  I would like her systolic blood pressure to be in the 130s.  Check CMP and fasting lipid panel.  Goal LDL cholesterol is less than 100.  Check A1c.  Goal A1c is less than 6.5.  Recommended diet exercise  and weight loss.

## 2020-09-01 LAB — CBC WITH DIFFERENTIAL/PLATELET
Absolute Monocytes: 515 cells/uL (ref 200–950)
Basophils Absolute: 63 cells/uL (ref 0–200)
Basophils Relative: 0.6 %
Eosinophils Absolute: 420 cells/uL (ref 15–500)
Eosinophils Relative: 4 %
HCT: 45.6 % — ABNORMAL HIGH (ref 35.0–45.0)
Hemoglobin: 15.4 g/dL (ref 11.7–15.5)
Lymphs Abs: 2573 cells/uL (ref 850–3900)
MCH: 30 pg (ref 27.0–33.0)
MCHC: 33.8 g/dL (ref 32.0–36.0)
MCV: 88.7 fL (ref 80.0–100.0)
MPV: 12.1 fL (ref 7.5–12.5)
Monocytes Relative: 4.9 %
Neutro Abs: 6930 cells/uL (ref 1500–7800)
Neutrophils Relative %: 66 %
Platelets: 319 10*3/uL (ref 140–400)
RBC: 5.14 10*6/uL — ABNORMAL HIGH (ref 3.80–5.10)
RDW: 13.2 % (ref 11.0–15.0)
Total Lymphocyte: 24.5 %
WBC: 10.5 10*3/uL (ref 3.8–10.8)

## 2020-09-01 LAB — COMPLETE METABOLIC PANEL WITH GFR
AG Ratio: 1.5 (calc) (ref 1.0–2.5)
ALT: 10 U/L (ref 6–29)
AST: 13 U/L (ref 10–35)
Albumin: 4.1 g/dL (ref 3.6–5.1)
Alkaline phosphatase (APISO): 99 U/L (ref 37–153)
BUN: 13 mg/dL (ref 7–25)
CO2: 27 mmol/L (ref 20–32)
Calcium: 9.9 mg/dL (ref 8.6–10.4)
Chloride: 104 mmol/L (ref 98–110)
Creat: 0.99 mg/dL (ref 0.50–1.03)
Globulin: 2.8 g/dL (calc) (ref 1.9–3.7)
Glucose, Bld: 107 mg/dL — ABNORMAL HIGH (ref 65–99)
Potassium: 4.3 mmol/L (ref 3.5–5.3)
Sodium: 139 mmol/L (ref 135–146)
Total Bilirubin: 0.5 mg/dL (ref 0.2–1.2)
Total Protein: 6.9 g/dL (ref 6.1–8.1)
eGFR: 69 mL/min/{1.73_m2} (ref >=60)

## 2020-09-01 LAB — HEMOGLOBIN A1C
Hgb A1c MFr Bld: 6 % of total Hgb — ABNORMAL HIGH (ref <5.7)
Mean Plasma Glucose: 126 mg/dL
eAG (mmol/L): 7 mmol/L

## 2020-09-01 LAB — MICROALBUMIN, URINE: Microalb, Ur: 3.2 mg/dL

## 2020-09-01 LAB — LIPID PANEL
Cholesterol: 162 mg/dL (ref <200)
HDL: 43 mg/dL — ABNORMAL LOW (ref >=50)
LDL Cholesterol (Calc): 89 mg/dL (calc)
Non-HDL Cholesterol (Calc): 119 mg/dL (calc) (ref <130)
Total CHOL/HDL Ratio: 3.8 (calc) (ref <5.0)
Triglycerides: 200 mg/dL — ABNORMAL HIGH (ref <150)

## 2020-10-05 HISTORY — PX: COLONOSCOPY W/ POLYPECTOMY: SHX1380

## 2020-10-06 ENCOUNTER — Other Ambulatory Visit: Payer: Self-pay | Admitting: Family Medicine

## 2020-11-09 ENCOUNTER — Other Ambulatory Visit: Payer: Self-pay | Admitting: Family Medicine

## 2021-01-19 ENCOUNTER — Encounter: Payer: Self-pay | Admitting: Nurse Practitioner

## 2021-01-19 ENCOUNTER — Other Ambulatory Visit: Payer: Self-pay

## 2021-01-19 ENCOUNTER — Ambulatory Visit: Payer: BC Managed Care – PPO | Admitting: Nurse Practitioner

## 2021-01-19 VITALS — BP 130/58 | HR 79 | Temp 97.7°F | Wt 229.4 lb

## 2021-01-19 DIAGNOSIS — R61 Generalized hyperhidrosis: Secondary | ICD-10-CM | POA: Diagnosis not present

## 2021-01-19 DIAGNOSIS — L739 Follicular disorder, unspecified: Secondary | ICD-10-CM | POA: Diagnosis not present

## 2021-01-19 MED ORDER — DRYSOL 20 % EX SOLN: Freq: Every day | CUTANEOUS | 0 refills | Status: AC

## 2021-01-19 NOTE — Progress Notes (Signed)
Subjective:    Patient ID: Alexandra Christensen, female    DOB: 06/01/1970, 50 y.o.   MRN: [id number]  HPI: Alexandra Christensen is a 50 y.o. female presenting for skin bumps.  Chief Complaint  Patient presents with   Rash    Under both arm pits    SKIN INFECTION Reports she has had issues with shaving the hair under her arms causing red painful bumps.  She has been using nair to remove the hair and recently changed to a different type of nair.  She reports after a busy Thanksgiving and cooking in a warm kitchen, she has numerous red, painful bumps under both arms.  She has manually been removing the pus from these areas. Duration: days Location: under arms bilatearlly  History of trauma in area: no Pain: yes Fever: no Quality: sharp Severity: moderate to severe Redness: yes Swelling: yes Oozing: no Pus: yes Fevers: no Nausea/vomiting: no Status: better Treatments attempted: coconut oil, frankensensce    Allergies  Allergen Reactions   Adhesive [Tape] Rash    Outpatient Encounter Medications as of 01/19/2021  Medication Sig   ALPRAZolam (XANAX) 1 MG tablet Take 1 tablet (1 mg total) by mouth at bedtime as needed. for sleep   aluminum chloride (DRYSOL) 20 % external solution Apply topically at bedtime.   atorvastatin (LIPITOR) 20 MG tablet Take 1 tablet (20 mg total) by mouth daily. (Patient taking differently: Take 20 mg by mouth daily.)   Blood Glucose Monitoring Suppl (BLOOD GLUCOSE SYSTEM PAK) KIT Dispense based on patient and insurance preference. Use to monitor FSBS 1x daily. Dx: E11.9   gabapentin (NEURONTIN) 300 MG capsule TAKE 1 CAPSULE BY MOUTH IN THE MORNING AND 2 AT BEDTIME (Patient taking differently: Take 300 mg by mouth 2 (two) times daily. TAKE 1 CAPSULE BY MOUTH IN THE MORNING AND 2 AT BEDTIME)   glucose blood (ONE TOUCH ULTRA TEST) test strip USE TO CHECK FASTING BLOOD GLUCOSE ONCE DAILY   ibuprofen (ADVIL) 800 MG tablet TAKE 1 TABLET BY MOUTH EVERY 8 HOURS  AS NEEDED. NO FURTHER REFILLS TO BE GIVEN. PATIENT MUST ESTABLISH WITH NEW PCP   Lancet Devices MISC Dispense based on patient and insurance preference. Use to monitor FSBS 1x daily. Dx: E11.9   Lancets MISC Dispense based on patient and insurance preference. Use to monitor FSBS 1x daily. Dx: E11.9   losartan (COZAAR) 25 MG tablet Take 1 tablet (25 mg total) by mouth daily. (Patient taking differently: Take 25 mg by mouth daily.)   OZEMPIC, 0.25 OR 0.5 MG/DOSE, 2 MG/1.5ML SOPN INJECT 0.25 MG INTO THE SKIN ONCE A WEEK   No facility-administered encounter medications on file as of 01/19/2021.    Patient Active Problem List   Diagnosis Date Noted   Leukocytosis 06/21/2019   Essential hypertension 12/26/2015   Neuropathy 03/15/2015   Smoker 11/01/2014   Hypertriglyceridemia 04/15/2014   Type 2 diabetes mellitus with neurological complications ([place]) 80/88/1103   Insomnia due to stress 01/12/2014   Frequent headaches 01/12/2014   Splenic infarct 06/16/2013   Abdominal pain, unspecified site 06/14/2013   Inflammatory bowel diseases (IBD)    Obesity     Past Medical History:  Diagnosis Date   Family history of adverse reaction to anesthesia    mother-- hard to wake   History of colitis 2011   Hyperlipidemia    Hypertension    followed by pcp   Perirectal abscess    recurrent   Splenic disorder  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 Interim medical history since our last visit reviewed.  Review of Systems Per HPI unless specifically indicated above     Objective:    BP (!) 130/58   Pulse 79   Temp 97.7 F (36.5 C)   Wt 229 lb 6.4 oz (104.1 kg)   LMP 08/31/2019    SpO2 95%   BMI 35.93 kg/m   Wt Readings from Last 3 Encounters:  01/19/21 229 lb 6.4 oz (104.1 kg)  08/31/20 222 lb (100.7 kg)  07/05/20 221 lb 9.6 oz (100.5 kg)    Physical Exam Vitals and nursing note reviewed.  Constitutional:      General: She is not in acute distress.    Appearance: Normal appearance. She is not toxic-appearing.  Chest:       Comments: Multiple erythematous, tender, raised nodules to each underarm as marked above.  No fluctuance, active drainage, odor to nodules. Skin:    General: Skin is warm and dry.     Coloration: Skin is not jaundiced or pale.     Findings: Erythema present.  Neurological:     Mental Status: She is alert and oriented to person, place, and time.  Psychiatric:        Mood and Affect: Mood normal.        Behavior: Behavior normal.        Thought Content: Thought content normal.        Judgment: Judgment normal.      Assessment & Plan:  1. Folliculitis Acute.  I suspect the erythematous nodules are folliculitis.  I advised her to go back to her regular narrow products.  We can also start using Drysol to help with sweating to see if this helps decrease sebaceous gland and prevent inflamed follicles.  Continue to use warm compresses.  No indication for antibiotics or I&D today-this was discussed with the patient.  Follow-up with no improvement.  2. Hyperhidrosis  - aluminum chloride (DRYSOL) 20 % external solution; Apply topically at bedtime.  Dispense: 35 mL; Refill: 0     Follow up plan: Return if symptoms worsen or fail to improve.

## 2021-03-05 ENCOUNTER — Other Ambulatory Visit: Payer: Self-pay | Admitting: Family Medicine

## 2021-04-18 ENCOUNTER — Other Ambulatory Visit: Payer: Self-pay

## 2021-04-18 ENCOUNTER — Encounter (HOSPITAL_BASED_OUTPATIENT_CLINIC_OR_DEPARTMENT_OTHER): Payer: Self-pay | Admitting: Surgery

## 2021-04-18 NOTE — Progress Notes (Addendum)
Spoke w/ via phone for pre-op interview---Andreal ?Lab needs dos---- ISTAT, EKG, urine pregnancy POCT per anesthesia, surgery orders pending as of 04/18/21              ?Lab results------10/29/19 EKG in Epic for comarison, 2015 Echo LVEF = 55 - 60% in Epic ?COVID test -----patient states asymptomatic no test needed ?Arrive at -------1045 on Monday, 04/23/21 ?NPO after MN NO Solid Food.  Clear liquids from MN until---0945 ?Med rec completed ?Medications to take morning of surgery -----Lipitor, Gabapentin ?Diabetic medication -----Hold Ozempic on the morning of surgery. ?Patient instructed no nail polish to be worn day of surgery ?Patient instructed to bring photo id and insurance card day of surgery ?Patient aware to have Driver (ride ) / caregiver    for 24 hours after surgery - boyfriend, Anastasio Auerbach ?Patient Special Instructions -----No smoking or alcohol 24 hours before surgery if possible. ?Pre-Op special Istructions -----Requested orders from Dr. Cliffton Asters via Epic IB on 04/18/21. ?Patient verbalized understanding of instructions that were given at this phone interview. ?Patient denies shortness of breath, chest pain, fever, cough at this phone interview.  ?

## 2021-04-19 ENCOUNTER — Ambulatory Visit: Payer: Self-pay | Admitting: Surgery

## 2021-04-23 ENCOUNTER — Encounter (HOSPITAL_BASED_OUTPATIENT_CLINIC_OR_DEPARTMENT_OTHER)
Admission: RE | Disposition: A | Discharge status: Home / Self Care | Payer: Self-pay | Source: Home / Self Care | Attending: Surgery

## 2021-04-23 ENCOUNTER — Ambulatory Visit (HOSPITAL_BASED_OUTPATIENT_CLINIC_OR_DEPARTMENT_OTHER): Payer: BC Managed Care – PPO | Admitting: Anesthesiology

## 2021-04-23 ENCOUNTER — Other Ambulatory Visit: Payer: Self-pay

## 2021-04-23 ENCOUNTER — Ambulatory Visit (HOSPITAL_BASED_OUTPATIENT_CLINIC_OR_DEPARTMENT_OTHER)
Admission: RE | Admit: 2021-04-23 | Discharge: 2021-04-23 | Disposition: A | Discharge status: Home / Self Care | Payer: BC Managed Care – PPO | Attending: Surgery | Admitting: Surgery

## 2021-04-23 ENCOUNTER — Encounter (HOSPITAL_BASED_OUTPATIENT_CLINIC_OR_DEPARTMENT_OTHER): Payer: Self-pay | Admitting: Surgery

## 2021-04-23 DIAGNOSIS — I10 Essential (primary) hypertension: Secondary | ICD-10-CM | POA: Insufficient documentation

## 2021-04-23 DIAGNOSIS — G709 Myoneural disorder, unspecified: Secondary | ICD-10-CM | POA: Insufficient documentation

## 2021-04-23 DIAGNOSIS — K603 Anal fistula: Secondary | ICD-10-CM | POA: Insufficient documentation

## 2021-04-23 DIAGNOSIS — E119 Type 2 diabetes mellitus without complications: Secondary | ICD-10-CM | POA: Diagnosis not present

## 2021-04-23 DIAGNOSIS — F17200 Nicotine dependence, unspecified, uncomplicated: Secondary | ICD-10-CM | POA: Insufficient documentation

## 2021-04-23 HISTORY — DX: Polyneuropathy, unspecified: G62.9

## 2021-04-23 HISTORY — PX: PLACEMENT OF SETON: SHX6029

## 2021-04-23 HISTORY — DX: Carpal tunnel syndrome, bilateral upper limbs: G56.03

## 2021-04-23 HISTORY — PX: INCISION AND DRAINAGE ABSCESS: SHX5864

## 2021-04-23 HISTORY — PX: RECTAL EXAM UNDER ANESTHESIA: SHX6399

## 2021-04-23 LAB — POCT I-STAT, CHEM 8
BUN: 16 mg/dL (ref 6–20)
Calcium, Ion: 1.04 mmol/L — ABNORMAL LOW (ref 1.15–1.40)
Chloride: 108 mmol/L (ref 98–111)
Creatinine, Ser: 0.8 mg/dL (ref 0.44–1.00)
Glucose, Bld: 110 mg/dL — ABNORMAL HIGH (ref 70–99)
HCT: 49 % — ABNORMAL HIGH (ref 36.0–46.0)
Hemoglobin: 16.7 g/dL — ABNORMAL HIGH (ref 12.0–15.0)
Potassium: 4.4 mmol/L (ref 3.5–5.1)
Sodium: 138 mmol/L (ref 135–145)
TCO2: 23 mmol/L (ref 22–32)

## 2021-04-23 LAB — GLUCOSE, CAPILLARY: Glucose-Capillary: 99 mg/dL (ref 70–99)

## 2021-04-23 SURGERY — INCISION AND DRAINAGE, ABSCESS
Anesthesia: General | Site: Anus

## 2021-04-23 MED ORDER — FLEET ENEMA 7-19 GM/118ML RE ENEM: 1.0000 | ENEMA | Freq: Once | RECTAL | Status: DC

## 2021-04-23 MED ORDER — GLYCOPYRROLATE 0.2 MG/ML IJ SOLN
INTRAMUSCULAR | Status: DC | PRN
  Administered 2021-04-23 (×2): .1 mg via INTRAVENOUS

## 2021-04-23 MED ORDER — BUPIVACAINE LIPOSOME 1.3 % IJ SUSP
INTRAMUSCULAR | Status: DC | PRN
  Administered 2021-04-23: 12 mL

## 2021-04-23 MED ORDER — GLYCOPYRROLATE PF 0.2 MG/ML IJ SOSY
PREFILLED_SYRINGE | INTRAMUSCULAR | Status: AC
  Filled 2021-04-23: qty 3

## 2021-04-23 MED ORDER — PHENYLEPHRINE 40 MCG/ML (10ML) SYRINGE FOR IV PUSH (FOR BLOOD PRESSURE SUPPORT)
PREFILLED_SYRINGE | INTRAVENOUS | Status: AC
  Filled 2021-04-23: qty 10

## 2021-04-23 MED ORDER — ACETAMINOPHEN 500 MG PO TABS
ORAL_TABLET | ORAL | Status: AC
  Filled 2021-04-23: qty 2

## 2021-04-23 MED ORDER — BUPIVACAINE-EPINEPHRINE 0.25% -1:200000 IJ SOLN
INTRAMUSCULAR | Status: DC | PRN
  Administered 2021-04-23: 18 mL

## 2021-04-23 MED ORDER — FENTANYL CITRATE (PF) 100 MCG/2ML IJ SOLN
INTRAMUSCULAR | Status: DC | PRN
  Administered 2021-04-23: 50 ug via INTRAVENOUS
  Administered 2021-04-23 (×2): 25 ug via INTRAVENOUS

## 2021-04-23 MED ORDER — PROPOFOL 10 MG/ML IV BOLUS
INTRAVENOUS | Status: DC | PRN
  Administered 2021-04-23: 150 mg via INTRAVENOUS

## 2021-04-23 MED ORDER — TRAMADOL HCL 50 MG PO TABS: 50.0000 mg | ORAL_TABLET | Freq: Four times a day (QID) | ORAL | 0 refills | Status: AC | PRN

## 2021-04-23 MED ORDER — MIDAZOLAM HCL 2 MG/2ML IJ SOLN
INTRAMUSCULAR | Status: AC
  Filled 2021-04-23: qty 2

## 2021-04-23 MED ORDER — PHENYLEPHRINE HCL (PRESSORS) 10 MG/ML IV SOLN
INTRAVENOUS | Status: DC | PRN
Start: 2021-04-23 — End: 2021-04-23
  Administered 2021-04-23: 80 ug via INTRAVENOUS

## 2021-04-23 MED ORDER — DEXAMETHASONE SODIUM PHOSPHATE 4 MG/ML IJ SOLN
INTRAMUSCULAR | Status: DC | PRN
  Administered 2021-04-23: 4 mg via INTRAVENOUS

## 2021-04-23 MED ORDER — BUPIVACAINE LIPOSOME 1.3 % IJ SUSP
20.0000 mL | Freq: Once | INTRAMUSCULAR | Status: DC
Start: 2021-04-23 — End: 2021-04-23

## 2021-04-23 MED ORDER — FENTANYL CITRATE (PF) 100 MCG/2ML IJ SOLN
INTRAMUSCULAR | Status: AC
  Filled 2021-04-23: qty 2

## 2021-04-23 MED ORDER — ONDANSETRON HCL 4 MG/2ML IJ SOLN
INTRAMUSCULAR | Status: DC | PRN
  Administered 2021-04-23: 4 mg via INTRAVENOUS

## 2021-04-23 MED ORDER — LACTATED RINGERS IV SOLN: INTRAVENOUS | Status: DC

## 2021-04-23 MED ORDER — MIDAZOLAM HCL 2 MG/2ML IJ SOLN
INTRAMUSCULAR | Status: DC | PRN
Start: 2021-04-23 — End: 2021-04-23
  Administered 2021-04-23: 2 mg via INTRAVENOUS

## 2021-04-23 MED ORDER — ACETAMINOPHEN 500 MG PO TABS
1000.0000 mg | ORAL_TABLET | ORAL | Status: AC
  Administered 2021-04-23: 1000 mg via ORAL

## 2021-04-23 MED ORDER — 0.9 % SODIUM CHLORIDE (POUR BTL) OPTIME
TOPICAL | Status: DC | PRN
  Administered 2021-04-23: 500 mL

## 2021-04-23 MED ORDER — LIDOCAINE HCL (CARDIAC) PF 100 MG/5ML IV SOSY
PREFILLED_SYRINGE | INTRAVENOUS | Status: DC | PRN
  Administered 2021-04-23: 60 mg via INTRAVENOUS

## 2021-04-23 SURGICAL SUPPLY — 65 items
APL SKNCLS STERI-STRIP NONHPOA (GAUZE/BANDAGES/DRESSINGS) ×2
BENZOIN TINCTURE PRP APPL 2/3 (GAUZE/BANDAGES/DRESSINGS) ×4 IMPLANT
BLADE EXTENDED COATED 6.5IN (ELECTRODE) IMPLANT
BLADE SURG 10 STRL SS (BLADE) IMPLANT
BLADE SURG 15 STRL LF DISP TIS (BLADE) ×1 IMPLANT
BLADE SURG 15 STRL SS (BLADE) ×2
COVER BACK TABLE 60X90IN (DRAPES) ×2 IMPLANT
COVER MAYO STAND STRL (DRAPES) ×2 IMPLANT
DECANTER SPIKE VIAL GLASS SM (MISCELLANEOUS) ×2 IMPLANT
DRAIN PENROSE 0.25X18 (DRAIN) ×1 IMPLANT
DRAPE HYSTEROSCOPY (MISCELLANEOUS) IMPLANT
DRAPE LAPAROTOMY 100X72 PEDS (DRAPES) ×2 IMPLANT
DRAPE SHEET LG 3/4 BI-LAMINATE (DRAPES) IMPLANT
DRAPE UTILITY XL STRL (DRAPES) ×2 IMPLANT
DRSG PAD ABDOMINAL 8X10 ST (GAUZE/BANDAGES/DRESSINGS) ×2 IMPLANT
GAUZE 4X4 16PLY ~~LOC~~+RFID DBL (SPONGE) ×2 IMPLANT
GAUZE SPONGE 4X4 12PLY STRL (GAUZE/BANDAGES/DRESSINGS) ×2 IMPLANT
GLOVE SRG 8 PF TXTR STRL LF DI (GLOVE) ×1 IMPLANT
GLOVE SURG ENC MOIS LTX SZ7.5 (GLOVE) ×2 IMPLANT
GLOVE SURG UNDER POLY LF SZ8 (GLOVE) ×2
GOWN STRL REUS W/TWL LRG LVL3 (GOWN DISPOSABLE) ×2 IMPLANT
HYDROGEN PEROXIDE 16OZ (MISCELLANEOUS) IMPLANT
IV CATH 14GX2 1/4 (CATHETERS) IMPLANT
IV CATH 18G SAFETY (IV SOLUTION) IMPLANT
KIT SIGMOIDOSCOPE (SET/KITS/TRAYS/PACK) IMPLANT
KIT TURNOVER CYSTO (KITS) ×2 IMPLANT
LEGGING LITHOTOMY PAIR STRL (DRAPES) IMPLANT
LOOP VESSEL MAXI BLUE (MISCELLANEOUS) ×2 IMPLANT
NDL HYPO 25X1 1.5 SAFETY (NEEDLE) IMPLANT
NDL SAFETY ECLIPSE 18X1.5 (NEEDLE) IMPLANT
NEEDLE HYPO 18GX1.5 SHARP (NEEDLE)
NEEDLE HYPO 22GX1.5 SAFETY (NEEDLE) ×2 IMPLANT
NEEDLE HYPO 25X1 1.5 SAFETY (NEEDLE) IMPLANT
NS IRRIG 500ML POUR BTL (IV SOLUTION) ×2 IMPLANT
PACK BASIN DAY SURGERY FS (CUSTOM PROCEDURE TRAY) ×2 IMPLANT
PANTS MESH DISP LRG (UNDERPADS AND DIAPERS) ×1 IMPLANT
PANTS MESH DISPOSABLE L (UNDERPADS AND DIAPERS) ×1
PENCIL SMOKE EVACUATOR (MISCELLANEOUS) ×2 IMPLANT
SPONGE HEMORRHOID 8X3CM (HEMOSTASIS) IMPLANT
SPONGE SURGIFOAM ABS GEL 12-7 (HEMOSTASIS) IMPLANT
SUCTION FRAZIER HANDLE 10FR (MISCELLANEOUS)
SUCTION TUBE FRAZIER 10FR DISP (MISCELLANEOUS) IMPLANT
SUT CHROMIC 2 0 SH (SUTURE) IMPLANT
SUT CHROMIC 3 0 SH 27 (SUTURE) IMPLANT
SUT MNCRL AB 4-0 PS2 18 (SUTURE) IMPLANT
SUT SILK 0 SH 30 (SUTURE) ×1 IMPLANT
SUT SILK 0 TIES 10X30 (SUTURE) IMPLANT
SUT SILK 2 0 (SUTURE)
SUT SILK 2-0 18XBRD TIE 12 (SUTURE) IMPLANT
SUT VIC AB 2-0 SH 27 (SUTURE)
SUT VIC AB 2-0 SH 27XBRD (SUTURE) IMPLANT
SUT VIC AB 3-0 SH 18 (SUTURE) IMPLANT
SUT VIC AB 3-0 SH 27 (SUTURE)
SUT VIC AB 3-0 SH 27X BRD (SUTURE) IMPLANT
SUT VIC AB 3-0 SH 27XBRD (SUTURE) IMPLANT
SUT VIC AB 4-0 P-3 18XBRD (SUTURE) IMPLANT
SUT VIC AB 4-0 P3 18 (SUTURE)
SYR 20ML LL LF (SYRINGE) IMPLANT
SYR BULB IRRIG 60ML STRL (SYRINGE) ×2 IMPLANT
SYR CONTROL 10ML LL (SYRINGE) ×2 IMPLANT
SYR TB 1ML LL NO SAFETY (SYRINGE) IMPLANT
TOWEL OR 17X26 10 PK STRL BLUE (TOWEL DISPOSABLE) ×2 IMPLANT
TRAY DSU PREP LF (CUSTOM PROCEDURE TRAY) ×2 IMPLANT
TUBE CONNECTING 12X1/4 (SUCTIONS) ×2 IMPLANT
YANKAUER SUCT BULB TIP NO VENT (SUCTIONS) ×2 IMPLANT

## 2021-04-23 NOTE — Anesthesia Preprocedure Evaluation (Addendum)
Anesthesia Evaluation  ?Patient identified by MRN, date of birth, ID band ?Patient awake ? ? ? ?Reviewed: ?Allergy & Precautions, NPO status , Patient's Chart, lab work & pertinent test results ? ?History of Anesthesia Complications ?Negative for: history of anesthetic complications ? ?Airway ?Mallampati: III ? ?TM Distance: >3 FB ?Neck ROM: Full ? ? ? Dental ? ?(+) Teeth Intact, Dental Advisory Given ?  ?Pulmonary ?Current Smoker,  ?  ?breath sounds clear to auscultation ? ? ? ? ? ? Cardiovascular ?hypertension, Pt. on medications ?(-) angina ?Rhythm:Regular  ?Left ventricle: The cavity size was normal. Wall thickness  ???was normal. Systolic function was normal. The estimated  ???ejection fraction was in the range of 55% to 60%. Wall  ???motion was normal; there were no regional wall motion  ???abnormalities.  ?- Aortic valve: No evidence of vegetation.  ?- Mitral valve: No evidence of vegetation. Mild  ???regurgitation.  ?- Left atrium: No evidence of thrombus in the atrial cavity  ???or appendage.  ?- Right atrium: No evidence of thrombus in the atrial cavity  ???or appendage.  ?- Atrial septum: No defect or patent foramen ovale was  ???identified. Echo contrast study showed no right-to-left  ???atrial level shunt, following an increase in RA pressure  ???induced by provocative maneuvers.  ?- Tricuspid valve: No evidence of vegetation.  ?- Pulmonic valve: No evidence of vegetation.  ? ?  ?Neuro/Psych ? Headaches,  Neuromuscular disease   ? GI/Hepatic ?negative GI ROS, Neg liver ROS,   ?Endo/Other  ?diabetesLab Results ?     Component                Value               Date                 ?     HGBA1C                   6.0 (H)             08/31/2020           ? ? Renal/GU ?Lab Results ?     Component                Value               Date                 ?     CREATININE               0.80                04/23/2021           ?  ? ?  ?Musculoskeletal ?negative musculoskeletal  ROS ?(+)  ? Abdominal ?  ?Peds ? Hematology ?negative hematology ROS ?(+) Lab Results ?     Component                Value               Date                 ?     WBC                      10.5                08/31/2020           ?  HGB                      16.7 (H)            04/23/2021           ?     HCT                      49.0 (H)            04/23/2021           ?     MCV                      88.7                08/31/2020           ?     PLT                      319                 08/31/2020           ?   ?Anesthesia Other Findings ? ? Reproductive/Obstetrics ? ?  ? ? ? ? ? ? ? ? ? ? ? ? ? ?  ?  ? ? ? ? ? ? ? ?Anesthesia Physical ?Anesthesia Plan ? ?ASA: 2 ? ?Anesthesia Plan: General  ? ?Post-op Pain Management: Toradol IV (intra-op)* and Tylenol PO (pre-op)*  ? ?Induction: Intravenous ? ?PONV Risk Score and Plan: 2 and Ondansetron and Dexamethasone ? ?Airway Management Planned: LMA ? ?Additional Equipment: None ? ?Intra-op Plan:  ? ?Post-operative Plan: Extubation in OR ? ?Informed Consent: I have reviewed the patients History and Physical, chart, labs and discussed the procedure including the risks, benefits and alternatives for the proposed anesthesia with the patient or authorized representative who has indicated his/her understanding and acceptance.  ? ? ? ?Dental advisory given ? ?Plan Discussed with: CRNA and Anesthesiologist ? ?Anesthesia Plan Comments:   ? ? ? ? ? ? ?Anesthesia Quick Evaluation ? ?

## 2021-04-23 NOTE — Op Note (Signed)
04/23/2021 ? ?1:19 PM ? ?PATIENT:  Alexandra Christensen  51 y.o. female ? ?Patient Care Team: ?Donita Brooks, MD as PCP - General (Family Medicine) ? ?PRE-OPERATIVE DIAGNOSIS:  Transsphincteric anal fistula with horseshoe configuration, anterior ? ?POST-OPERATIVE DIAGNOSIS:  Same ? ?PROCEDURE:   ?Incision/drainage of perianal abscess with placement of draining seton ?Replacement of additional draining seton ?Anorectal exam under anesthesia ? ?SURGEON:  Surgeon(s): ?Andria Meuse, MD ? ?ASSISTANT: OR staff  ? ?ANESTHESIA:   local and general ? ?DISPOSITION OF SPECIMEN:  N/A ? ?COUNTS:  Sponge, needle, and instrument counts were reported correct x2 at conclusion. ? ?EBL: 5 mL ? ?PLAN OF CARE: Discharge to home after PACU ? ?PATIENT DISPOSITION:  PACU - hemodynamically stable. ? ?OR FINDINGS: Left-sided seton no longer in place.  Right seton with 1 suture remaining.  Small abscess at the left external opening that was drained.  Fistula remains a high transsphincteric fistula coursing across the anterior midline and communicating with both sides in a horseshoe configuration.  The anterior course of the fistula is quite deep in the perineal body.  There is minimal swelling approximately 1 x 1 cm in size in the right posterior labia.  May actually have Bartholin cyst on the labia. Fistula tract courses quite deep at this juncture. Seton on left side replaced. Right sided seton replaced. We were not able to consolidate this down to a single fistula at this juncture given the abscess at present on the left side. ? ?DESCRIPTION: ?The patient was identified in the preoperative holding area and taken to the OR where she was placed on the operating room table. SCDs were placed.  General anesthesia was induced without difficulty. The patient was then positioned in high lithotomy with Allen stirrups. Pressure points were then evaluated and padded.  She was then prepped and draped in usual sterile fashion.  A surgical  timeout was performed indicating the correct patient, procedure, and positioning.  A perianal block was performed using a dilute mixture of 0.25% Marcaine with epinephrine and Exparel.  ? ?After ascertaining that an appropriate level of anesthesia had been achieved, a well lubricated digital rectal exam was performed. This demonstrated no palpable masses or abnormalities.  There is a small area of swelling in the right posterior labia that is approximately 1 x 1 cm in size.  There is no erythema.  There is no fluctuance.  This may be a Bartholin's cyst.  It is not clear that this actually communicates with the current fistulous process going on.  A Hill-Ferguson anoscope was into the anal canal and circumferential inspection demonstrated healthy appearing anoderm.  No ulcerations.  1 remaining seton emanates through the anterior midline.  External opening on the right lateral perianal skin.   ? ?The left external opening is partially patent.  We do incise this to facilitate examination and find an approximate 1 x 1 cm abscess collection associated with the external opening that is drained.  We were able to cannulate this tract.  Given the infectious findings, we opted to replace the seton.  The fistula courses anteriorly and communicates with a shared internal opening.  This is consistent with a horseshoe configuration.  The course of the tract is quite deep in the perineal body.  I am unable to delineate any palpable tracts extending up towards the introitus.  When we passed the fistula probe, not able to find any communication with this area either.  Given these constellation of findings, we opted not to  pursue any further procedures at this juncture but will plan for an outpatient pelvic MRI to further delineate the anatomy. ? ?All counts are reported correct. A dressing consisting of 4 x 4's, ABD, mesh underwear was placed.  She was taken out of lithotomy position, awakened from anesthesia, extubated, and  transferred to a stretcher for transport to PACU in satisfactory condition. ? ?DISPOSITION: PACU in satisfactory condition. ?

## 2021-04-23 NOTE — Anesthesia Procedure Notes (Signed)
Procedure Name: LMA Insertion ?Date/Time: 04/23/2021 12:12 PM ?Performed by: Earmon Phoenix, CRNA ?Pre-anesthesia Checklist: Patient identified, Emergency Drugs available, Suction available, Patient being monitored and Timeout performed ?Patient Re-evaluated:Patient Re-evaluated prior to induction ?Oxygen Delivery Method: Circle system utilized ?Preoxygenation: Pre-oxygenation with 100% oxygen ?Induction Type: IV induction ?Ventilation: Mask ventilation without difficulty ?LMA: LMA inserted ?LMA Size: 4.0 ?Number of attempts: 1 ?Placement Confirmation: positive ETCO2, CO2 detector and breath sounds checked- equal and bilateral ?Tube secured with: Tape ?Dental Injury: Teeth and Oropharynx as per pre-operative assessment  ? ? ? ? ?

## 2021-04-23 NOTE — Transfer of Care (Signed)
Immediate Anesthesia Transfer of Care Note ? ?Patient: Alexandra Christensen ? ?Procedure(s) Performed: INCISION AND DRAINAGE OF PERIRECTAL ABSCESS (Anus) ?PLACEMENT OF SETONS TIMES TWO (Anus) ?ANORECTAL EXAM UNDER ANESTHESIA (Anus) ? ?Patient Location: PACU ? ?Anesthesia Type:General ? ?Level of Consciousness: awake, alert , oriented and patient cooperative ? ?Airway & Oxygen Therapy: Patient Spontanous Breathing ? ?Post-op Assessment: Report given to RN and Post -op Vital signs reviewed and stable ? ?Post vital signs: Reviewed and stable ? ?Last Vitals:  ?Vitals Value Taken Time  ?BP 155/85 04/23/21 1301  ?Temp    ?Pulse 82 04/23/21 1304  ?Resp 10 04/23/21 1304  ?SpO2 97 % 04/23/21 1304  ?Vitals shown include unvalidated device data. ? ?Last Pain:  ?Vitals:  ? 04/23/21 1039  ?TempSrc: Oral  ?PainSc: 0-No pain  ?   ? ?Patients Stated Pain Goal: 5 (04/23/21 1039) ? ?Complications: No notable events documented. ?

## 2021-04-23 NOTE — Anesthesia Postprocedure Evaluation (Signed)
Anesthesia Post Note ? ?Patient: Alexandra Christensen ? ?Procedure(s) Performed: INCISION AND DRAINAGE OF PERIRECTAL ABSCESS (Anus) ?PLACEMENT OF SETONS TIMES TWO (Anus) ?ANORECTAL EXAM UNDER ANESTHESIA (Anus) ? ?  ? ?Patient location during evaluation: PACU ?Anesthesia Type: General ?Level of consciousness: awake and alert ?Pain management: pain level controlled ?Vital Signs Assessment: post-procedure vital signs reviewed and stable ?Respiratory status: spontaneous breathing, nonlabored ventilation, respiratory function stable and patient connected to nasal cannula oxygen ?Cardiovascular status: blood pressure returned to baseline and stable ?Postop Assessment: no apparent nausea or vomiting ?Anesthetic complications: no ? ? ?No notable events documented. ? ?Last Vitals:  ?Vitals:  ? 04/23/21 1315 04/23/21 1358  ?BP: 136/77 (!) 148/85  ?Pulse: 71 65  ?Resp: 13 16  ?Temp: 36.5 ?C 36.6 ?C  ?SpO2: 97% 97%  ?  ?Last Pain:  ?Vitals:  ? 04/23/21 1358  ?TempSrc:   ?PainSc: 0-No pain  ? ? ?  ?  ?  ?  ?  ?  ? ?[name] ? ? ? ? ?

## 2021-04-23 NOTE — H&P (Signed)
CC: here today for surgery  HPI: Alexandra Christensen is an 51 y.o. female hx of DM, HTN and previously developed swelling on her left buttock she reports the size of a baseball and was drained.  She has since had intermittent drainage from this location.  There last week or so she's also developed swelling on the right side of her buttock.  She states she was seen by her PCP, Dr. Alvino Chapel, and performed a incision and drainage but she reports he only got a small amount of pus but some blood out.  She has since had continued swelling on her right buttock.  She reports that she had a colonoscopy approximately 8 years ago with Dr. Benson Norway and she is unsure what the findings were.  OR 11/03/19 - EUA, partial fistulotomy. She was found to have communication between the wounds on her right and left buttocks - despite multiple attempts with fistula probe and peroxide, there was no definitive fistula proven to anal canal - if she has a fistula, would likely be anterior midline.  OR 07/05/20: 1. Incision and drainage of left perirectal abscess 2. Placement of left gluteal draining seton 3. Placement of right gluteal draining seton 4. 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  2 separate draining setons were placed.  The tract courses through the rectovaginal septum.  Of note, she does note that she had a history of "colitis" that was treated for proximally 7 years.  It is unclear what kind of colitis she had or what treatment she underwent.  This was through Dr. Benson Norway.  She continues to smoke but is working on quitting.  Cscope 09/2020 Dr. Benson Norway - Path report showed TA removed from transverse colon; we don't have a physical copy of endoscopy report however.  Last seen 09/2020, she continues to smoke - reports she is down to 5 cigarettes/day.  She has acknowledged the benefits of quitting. She understands her fistula problem is quite complicated with regards to this being a horseshoe configuration and surgery for these being more successful without nicotine on board. She denies any recurrent perianal abscesses. Her setons remain in place and scant drainage. Denies any changes in her health or health history since we had met before.  Occasional pain on the right posterior most aspect of the perineum/introitus that will have scant drainage.  INTERVAL HX Denies any changes in her health. Did have seton come out 1.5 wks ago. Discussed possible consolidation into 1 seton tract today if amenable.   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  Past Medical History:  Diagnosis Date   Anal fistula 2022   Carpal tunnel syndrome, bilateral    per pt on 04/18/21   Family history of adverse reaction to anesthesia    mother-- hard to wake   History of colitis 2011   Hyperlipidemia    Hypertension    followed by pcp   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   Perirectal abscess    recurrent  Splenic disorder    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,  pt had TEE done 06-16-2013 normal;  (06-30-2020  no issue since and told unknown cause)   Type 2 diabetes mellitus ([place])    followed by pcp   (06-29-2020 pt stated does not check blood sugar at home)    Past Surgical History:  Procedure Laterality Date   ANAL FISTULOTOMY N/A 11/03/2019   Procedure: PARTIAL ANAL FISTULOTOMY;  Surgeon: Ileana Roup, MD;  Location: [facility];  Service: General;  Laterality: N/A;    COLONOSCOPY W/ POLYPECTOMY  10/05/2020   two small polyps in the tranverse colon   INCISION AND DRAINAGE ABSCESS N/A 07/05/2020   Procedure: [place];  Surgeon: Ileana Roup, MD;  Location: [place];  Service: General;  Laterality: N/A;   INCISION AND DRAINAGE ABSCESS ANAL     RECTAL EXAM UNDER ANESTHESIA N/A 11/03/2019   Procedure: RECTAL EXAM UNDER ANESTHESIA;  Surgeon: Ileana Roup, MD;  Location: [facility];  Service: General;  Laterality: N/A;   TEE WITHOUT CARDIOVERSION N/A 06/16/2013   Procedure: TRANSESOPHAGEAL ECHOCARDIOGRAM (TEE);  Surgeon: Pixie Casino, MD;  Location: [facility];  Service: Cardiovascular;  Laterality: N/A;   WISDOM TOOTH EXTRACTION      Family History  Problem Relation Age of Onset   Diabetes Mother    Heart disease Mother    Kidney disease Father    Heart disease Father    Stroke Sister 32   Cancer Sister        AML    Social:  reports that she has been smoking cigarettes. She has a 15.00 pack-year smoking history. She has never used smokeless tobacco. She reports current alcohol use. She reports that she does not use drugs.  Allergies:  Allergies  Allergen Reactions   Adhesive [Tape] Rash    Medications: I have reviewed the patient's current medications.  Results for orders placed or performed during the hospital encounter of 04/23/21 (from the past 48 hour(s))  I-STAT, chem 8     Status: Abnormal   Collection Time: 04/23/21 10:55 AM  Result Value Ref Range   Sodium 138 135 - 145 mmol/L   Potassium 4.4 3.5 - 5.1 mmol/L   Chloride 108 98 - 111 mmol/L   BUN 16 6 - 20 mg/dL   Creatinine, Ser 0.80 0.44 - 1.00 mg/dL   Glucose, Bld 110 (H) 70 - 99 mg/dL    Comment: Glucose reference range applies only to samples taken after fasting for at least 8 hours.   Calcium, Ion 1.04 (L) 1.15 - 1.40 mmol/L   TCO2 23 22 - 32 mmol/L   Hemoglobin 16.7 (H) 12.0 - 15.0  g/dL   HCT 49.0 (H) 36.0 - 46.0 %    No results found.  ROS - all of the below systems have been reviewed with the patient and positives are indicated with bold text General: chills, fever or night sweats Eyes: blurry vision or double vision ENT: epistaxis or sore throat Allergy/Immunology: itchy/watery eyes or nasal congestion Hematologic/Lymphatic: bleeding problems, blood clots or swollen lymph nodes Endocrine: temperature intolerance or unexpected weight changes Breast: new or changing breast lumps or nipple discharge Resp: cough, shortness of breath, or wheezing CV: chest pain or dyspnea on exertion GI: as per HPI GU: dysuria, trouble voiding, or hematuria MSK: joint pain or joint stiffness Neuro: TIA or stroke symptoms Derm: pruritus and skin lesion  changes Psych: anxiety and depression  PE Blood pressure 135/84, pulse (!) 52, temperature (!) 97.5 F (36.4 C), temperature source Oral, resp. rate 18, height 5' 6.25" (1.683 m), weight 102.9 kg, SpO2 98 %. Constitutional: NAD; conversant Eyes: Moist conjunctiva; no lid lag; anicteric Lungs: Normal respiratory effort CV: RRR MSK: Normal range of motion of extremities Psychiatric: Appropriate affect; alert and oriented x3  Results for orders placed or performed during the hospital encounter of 04/23/21 (from the past 48 hour(s))  I-STAT, chem 8     Status: Abnormal   Collection Time: 04/23/21 10:55 AM  Result Value Ref Range   Sodium 138 135 - 145 mmol/L   Potassium 4.4 3.5 - 5.1 mmol/L   Chloride 108 98 - 111 mmol/L   BUN 16 6 - 20 mg/dL   Creatinine, Ser 0.80 0.44 - 1.00 mg/dL   Glucose, Bld 110 (H) 70 - 99 mg/dL    Comment: Glucose reference range applies only to samples taken after fasting for at least 8 hours.   Calcium, Ion 1.04 (L) 1.15 - 1.40 mmol/L   TCO2 23 22 - 32 mmol/L   Hemoglobin 16.7 (H) 12.0 - 15.0 g/dL   HCT 49.0 (H) 36.0 - 46.0 %    No results found.  A/P: Alexandra Christensen is an 51 y.o. female  with hx of HTN, DM - now s/p placement of draining setons bilaterally in horseshoe configuration transsphincteric anal fistula that courses through the rectovaginal septum  Cscope 09/2020 - TA removed from transverse colon  I suspect the actual Rona Ravens sphincteric course of this horseshoe fistula can be controlled by placing a seton in the right posterior introitus/perineum. There is some scar tissue palpable at this location.  -Strongly have recommeded multiple times she quit smoking and detailed why in regards to worsening perianal disease, fistula recurrence rates, infectious and pulmonary complications  -We discussed from a fistula standpoint, would consider anorectal exam under anesthesia with either consolidation of setons into an anterior midline transsphincteric fistula to control this and ideally over time being able to get the ischiorectal drains out, allowing time for everything to scar down and subsequently attempting definitive procedure to address the fistula  Return for Postop appointment 3-5 weeks after surgery  Nadeen Landau, MD [facility], [place]

## 2021-04-23 NOTE — Discharge Instructions (Addendum)
ANORECTAL SURGERY: POST OP INSTRUCTIONS ? ?DIET: Follow a light bland diet the first 24 hours after arrival home, such as soup, liquids, crackers, etc.  Be sure to include lots of fluids daily.  Avoid fast food or heavy meals as your are more likely to get nauseated.  Eat a low fat diet the next few days after surgery.   ?Some bleeding with bowel movements is expected for the first couple of days but this should stop in between bowel movements ? ?Take your usually prescribed home medications unless otherwise directed. ? ?PAIN CONTROL: ?It is helpful to take an over-the-counter pain medication regularly for the first few days/weeks.  Choose from the following that works best for you: ?Ibuprofen (Advil, etc) Three '200mg'$  tabs every 6 hours as needed. ?Acetaminophen (Tylenol, etc) 500-'650mg'$  every 6 hours as needed ?NOTE: You may take both of these medications together - most patients find it most helpful when alternating between the two (i.e. Ibuprofen at 6am, tylenol at 9am, ibuprofen at 12pm ...) ?A  prescription for pain medication may have been prescribed for you at discharge.  Take your pain medication as prescribed.  ?If you are having problems/concerns with the prescription medicine, please call us for further advice. ? ?Avoid getting constipated.  Between the surgery and the pain medications, it is common to experience some constipation.  Increasing fluid intake (64oz of water per day) and taking a fiber supplement (such as Metamucil, Citrucel, FiberCon) 1-2 times a day regularly will usually help prevent this problem from occurring.  Take Miralax (over the counter) 1-2x/day while taking a narcotic pain medication. If no bowel movement after 48hours, you may additionally take a laxative like a bottle of Milk of Magnesia which can be purchased over the counter. Avoid enemas if possible as these are often painful. ?  ?Watch out for diarrhea.  If you have many loose bowel movements, simplify your diet to bland  foods.  Stop any stool softeners and decrease your fiber supplement. If this worsens or does not improve, please call us. ? ?Wash / shower every day.  If you were discharged with a dressing, you may remove this the day after your surgery. You may shower normally, getting soap/water on your wound, particularly after bowel movements. ? ?Soaking in a warm bath filled a couple inches ("Sitz bath") is a great way to clean the area after a bowel movement and many patients find it is a way to soothe the area. ? ?ACTIVITIES as tolerated:   ?You may resume regular (light) daily activities beginning the next day--such as daily self-care, walking, climbing stairs--gradually increasing activities as tolerated.  If you can walk 30 minutes without difficulty, it is safe to try more intense activity such as jogging, treadmill, bicycling, low-impact aerobics, etc. ?Refrain from any heavy lifting or straining for the first 2 weeks after your procedure, particularly if your surgery was for hemorrhoids. ?Avoid activities that make your pain worse ?You may drive when you are no longer taking prescription pain medication, you can comfortably wear a seatbelt, and you can safely maneuver your car and apply brakes. ? ?FOLLOW UP in our office ?Please call [facility] at [phone] to set up an appointment to see your surgeon in the office for a follow-up appointment approximately 2 weeks after your surgery. ?Make sure that you call for this appointment the day you arrive home to insure a convenient appointment time. ? ?9. If you have disability or family leave forms that need to be completed,  you may have them completed by your primary care physician's office; for return to work instructions, please ask our office staff and they will be happy to assist you in obtaining this documentation ?  ?When to call us [phone]: ?Poor pain control ?Reactions / problems with new medications (rash/itching, etc)  ?Fever over 101.5 F (38.5 C) ?Inability  to urinate ?Nausea/vomiting ?Worsening swelling or bruising ?Continued bleeding from incision. ?Increased pain, redness, or drainage from the incision ? ?The clinic staff is available to answer your questions during regular business hours (8:30am-5pm).  Please don?t hesitate to call and ask to speak to one of our nurses for clinical concerns.   A surgeon from [facility] is always on call at the hospitals ?  ?If you have a medical emergency, go to the nearest emergency room or call 911. ?  ?[facility] ?[facility] ?[address], [place], [place], [place]  [address] ?MAIN: [phone] ?FAX: [phone] ?[email or web address] ? ?Post Anesthesia Home Care Instructions ? ?Activity: ?Get plenty of rest for the remainder of the day. A responsible individual must stay with you for 24 hours following the procedure.  ?For the next 24 hours, DO NOT: ?-Drive a car ?-Paediatric nurse ?-Drink alcoholic beverages ?-Take any medication unless instructed by your physician ?-Make any legal decisions or sign important papers. ? ?Meals: ?Start with liquid foods such as gelatin or soup. Progress to regular foods as tolerated. Avoid greasy, spicy, heavy foods. If nausea and/or vomiting occur, drink only clear liquids until the nausea and/or vomiting subsides. Call your physician if vomiting continues. ? ?Special Instructions/Symptoms: ?Your throat may feel dry or sore from the anesthesia or the breathing tube placed in your throat during surgery. If this causes discomfort, gargle with warm salt water. The discomfort should disappear within 24 hours. ? ?If you had a scopolamine patch placed behind your ear for the management of post- operative nausea and/or vomiting: ? ?1. The medication in the patch is effective for 72 hours, after which it should be removed.  Wrap patch in a tissue and discard in the trash. Wash hands thoroughly with soap and water. ?2. You may remove the  patch earlier than 72 hours if you experience unpleasant side effects which may include dry mouth, dizziness or visual disturbances. ?3. Avoid touching the patch. Wash your hands with soap and water after contact with the patch. ? ?**No acetaminophen or Tylenol until 4:30pm today if needed. ? ? ?You received a medication called "EXPAREL" today to numb your surgical area.  Here is what you need to know: ?EXPAREL (bupivacaine liposome injectable suspension)  ? ?Your surgeon or anesthesiologist gave you EXPAREL(bupivacaine) to help control your pain after surgery.  ?EXPAREL is a local anesthetic that provides pain relief by numbing the tissue around the surgical site. ?EXPAREL is designed to release pain medication over time and can control pain for up to 72 hours. ?Depending on how you respond to EXPAREL, you may require less pain medication during your recovery. ? ?Possible side effects: ?Temporary loss of sensation or ability to move in the area where bupivacaine was injected. ?Nausea, vomiting, constipation ?Rarely, numbness and tingling in your mouth or lips, lightheadedness, or anxiety may occur. ?Call your doctor right away if you think you may be experiencing any of these sensations, or if you have other questions regarding possible side effects. ? ?Follow all other discharge instructions given to you by your surgeon or nurse. Eat a healthy diet and  drink plenty of water or other fluids. ? ?If you return to the hospital for any reason within 96 hours following the administration of EXPAREL, it is important for health care providers to know that you have received this anesthetic. A teal colored band has been placed on your arm with the date, time and amount of EXPAREL you have received in order to alert and inform your health care providers. Please leave this armband in place for the full 96 hours following administration, and then you may remove the band.  ?    ?

## 2021-04-24 ENCOUNTER — Encounter (HOSPITAL_BASED_OUTPATIENT_CLINIC_OR_DEPARTMENT_OTHER): Payer: Self-pay | Admitting: Surgery

## 2021-04-25 ENCOUNTER — Other Ambulatory Visit: Payer: Self-pay | Admitting: Surgery

## 2021-04-25 DIAGNOSIS — K605 Anorectal fistula: Secondary | ICD-10-CM

## 2021-05-12 ENCOUNTER — Other Ambulatory Visit: Payer: Self-pay

## 2021-05-12 ENCOUNTER — Ambulatory Visit
Admission: RE | Admit: 2021-05-12 | Discharge: 2021-05-12 | Disposition: A | Discharge status: Home / Self Care | Payer: BC Managed Care – PPO | Source: Ambulatory Visit | Attending: Surgery | Admitting: Surgery

## 2021-05-12 DIAGNOSIS — K605 Anorectal fistula: Secondary | ICD-10-CM | POA: Insufficient documentation

## 2021-05-12 MED ORDER — GADOBUTROL 1 MMOL/ML IV SOLN
10.0000 mL | Freq: Once | INTRAVENOUS | Status: AC | PRN
  Administered 2021-05-12: 10 mL via INTRAVENOUS

## 2021-07-13 ENCOUNTER — Other Ambulatory Visit: Payer: Self-pay | Admitting: Family Medicine

## 2021-07-13 ENCOUNTER — Other Ambulatory Visit: Payer: Self-pay

## 2021-07-13 MED ORDER — ATORVASTATIN CALCIUM 20 MG PO TABS: 20.0000 mg | ORAL_TABLET | Freq: Every day | ORAL | 3 refills | Status: DC

## 2021-07-13 MED ORDER — GABAPENTIN 300 MG PO CAPS: ORAL_CAPSULE | ORAL | 3 refills | Status: DC

## 2021-07-13 MED ORDER — LOSARTAN POTASSIUM 25 MG PO TABS: 25.0000 mg | ORAL_TABLET | Freq: Every day | ORAL | 2 refills | Status: DC

## 2021-07-13 MED ORDER — OZEMPIC (0.25 OR 0.5 MG/DOSE) 2 MG/1.5ML ~~LOC~~ SOPN: 0.2500 mg | PEN_INJECTOR | SUBCUTANEOUS | 0 refills | Status: DC

## 2021-07-13 MED ORDER — IBUPROFEN 800 MG PO TABS
800.0000 mg | ORAL_TABLET | Freq: Three times a day (TID) | ORAL | 0 refills | Status: AC | PRN
Start: 2021-07-13 — End: ?

## 2021-07-13 NOTE — Telephone Encounter (Signed)
LOV 08/31/20 Last refill 04/24/20, #60, 1 refills  Please review, thanks!

## 2021-07-17 NOTE — Telephone Encounter (Signed)
Refilled 07/13/2021 #[phone] refills. Requested Prescriptions  Pending Prescriptions Disp Refills  . ibuprofen (ADVIL) 800 MG tablet [Pharmacy Med Name: Ibuprofen 800 MG Oral Tablet] 90 tablet 0    Sig: TAKE 1 TABLET BY MOUTH EVERY 8 HOURS AS NEEDED     Analgesics:  NSAIDS Failed - 07/13/2021  2:04 PM      Failed - Manual Review: Labs are only required if the patient has taken medication for more than 8 weeks.      Failed - HGB in normal range and within 360 days    Hemoglobin  Date Value Ref Range Status  04/23/2021 16.7 (H) 12.0 - 15.0 g/dL Final   HGB  Date Value Ref Range Status  06/30/2013 14.1 12.0 - 16.0 g/dL Final         Failed - HCT in normal range and within 360 days    HCT  Date Value Ref Range Status  04/23/2021 49.0 (H) 36.0 - 46.0 % Final  06/30/2013 42.8 35.0 - 47.0 % Final         Passed - Cr in normal range and within 360 days    Creat  Date Value Ref Range Status  08/31/2020 0.99 0.50 - 1.03 mg/dL Final   Creatinine, Ser  Date Value Ref Range Status  04/23/2021 0.80 0.44 - 1.00 mg/dL Final   Creatinine, Urine  Date Value Ref Range Status  10/14/2018 142 20 - 275 mg/dL Final         Passed - PLT in normal range and within 360 days    Platelets  Date Value Ref Range Status  08/31/2020 319 140 - 400 Thousand/uL Final   Platelet  Date Value Ref Range Status  06/30/2013 284 150 - 440 x10 3/mm  Final    Comment:    Platelet - FIBRIN STRANDS SEEN ON SMEAR. THIS MAY  - AFFECT THE PLATELET COUNT. THE ACTUAL  - NUMERICAL COUNT MAY BE SOMEWHAT HIGHER  - THAN THE REPORTED VALUE.  - SLIGHT PLATELET CLUMPING IN SPECIMEN. ACTUAL  - NUMERICAL COUNT MAY BE SOMEWHAT HIGHER THAN  - THE REPORTED VALUE.          Passed - eGFR is 30 or above and within 360 days    GFR, Est African American  Date Value Ref Range Status  09/30/2017 96 > OR = 60 mL/min/1.82m2 Final   GFR calc Af Amer  Date Value Ref Range Status  10/29/2019 >60 >60 mL/min Final   GFR, Est Non  African American  Date Value Ref Range Status  09/30/2017 83 > OR = 60 mL/min/1.17m2 Final   GFR calc non Af Amer  Date Value Ref Range Status  10/29/2019 >60 >60 mL/min Final   eGFR  Date Value Ref Range Status  08/31/2020 69 > OR = 60 mL/min/1.104m2 Final    Comment:    The eGFR is based on the CKD-EPI 2021 equation. To calculate  the new eGFR from a previous Creatinine or Cystatin C result, go to https://www.kidney.org/professionals/ kdoqi/gfr%5Fcalculator          Passed - Patient is not pregnant      Passed - Valid encounter within last 12 months    Recent Outpatient Visits          5 months ago Folliculitis   [facility] Valentino Nose, NP   10 months ago Essential hypertension   [facility] [place], Priscille Heidelberg, MD   1 year ago Essential hypertension  [employer], Modena Nunnery, MD   1 year ago Routine general medical examination at a health care facility   [place], Modena Nunnery, MD   1 year ago Infected sebaceous cyst   [place] Pickard, Cammie Mcgee, MD

## 2021-08-06 ENCOUNTER — Other Ambulatory Visit: Payer: Self-pay | Admitting: Family Medicine

## 2021-08-06 NOTE — Telephone Encounter (Signed)
Requesting early. Requested Prescriptions  Pending Prescriptions Disp Refills  . ibuprofen (ADVIL) 800 MG tablet [Pharmacy Med Name: Ibuprofen 800 MG Oral Tablet] 90 tablet 0    Sig: TAKE 1 TABLET BY MOUTH EVERY 8 HOURS AS NEEDED     Analgesics:  NSAIDS Failed - 08/06/2021  5:05 PM      Failed - Manual Review: Labs are only required if the patient has taken medication for more than 8 weeks.      Failed - HGB in normal range and within 360 days    Hemoglobin  Date Value Ref Range Status  04/23/2021 16.7 (H) 12.0 - 15.0 g/dL Final   HGB  Date Value Ref Range Status  06/30/2013 14.1 12.0 - 16.0 g/dL Final         Failed - HCT in normal range and within 360 days    HCT  Date Value Ref Range Status  04/23/2021 49.0 (H) 36.0 - 46.0 % Final  06/30/2013 42.8 35.0 - 47.0 % Final         Passed - Cr in normal range and within 360 days    Creat  Date Value Ref Range Status  08/31/2020 0.99 0.50 - 1.03 mg/dL Final   Creatinine, Ser  Date Value Ref Range Status  04/23/2021 0.80 0.44 - 1.00 mg/dL Final   Creatinine, Urine  Date Value Ref Range Status  10/14/2018 142 20 - 275 mg/dL Final         Passed - PLT in normal range and within 360 days    Platelets  Date Value Ref Range Status  08/31/2020 319 140 - 400 Thousand/uL Final   Platelet  Date Value Ref Range Status  06/30/2013 284 150 - 440 x10 3/mm  Final    Comment:    Platelet - FIBRIN STRANDS SEEN ON SMEAR. THIS MAY  - AFFECT THE PLATELET COUNT. THE ACTUAL  - NUMERICAL COUNT MAY BE SOMEWHAT HIGHER  - THAN THE REPORTED VALUE.  - SLIGHT PLATELET CLUMPING IN SPECIMEN. ACTUAL  - NUMERICAL COUNT MAY BE SOMEWHAT HIGHER THAN  - THE REPORTED VALUE.          Passed - eGFR is 30 or above and within 360 days    GFR, Est African American  Date Value Ref Range Status  09/30/2017 96 > OR = 60 mL/min/1.65m2 Final   GFR calc Af Amer  Date Value Ref Range Status  10/29/2019 >60 >60 mL/min Final   GFR, Est Non African  American  Date Value Ref Range Status  09/30/2017 83 > OR = 60 mL/min/1.29m2 Final   GFR calc non Af Amer  Date Value Ref Range Status  10/29/2019 >60 >60 mL/min Final   eGFR  Date Value Ref Range Status  08/31/2020 69 > OR = 60 mL/min/1.105m2 Final    Comment:    The eGFR is based on the CKD-EPI 2021 equation. To calculate  the new eGFR from a previous Creatinine or Cystatin C result, go to https://www.kidney.org/professionals/ kdoqi/gfr%5Fcalculator          Passed - Patient is not pregnant      Passed - Valid encounter within last 12 months    Recent Outpatient Visits          6 months ago [place] Eulogio Bear, NP   11 months ago Essential hypertension   [place], Cammie Mcgee, MD   1 year ago Essential hypertension   [facility] [place], Modena Nunnery, MD   1 year ago Routine general medical examination at a health care facility   [place], Modena Nunnery, MD   1 year ago Infected sebaceous cyst   [place] Pickard, Cammie Mcgee, MD

## 2021-08-27 ENCOUNTER — Telehealth: Payer: Self-pay

## 2021-08-27 ENCOUNTER — Other Ambulatory Visit: Payer: Self-pay | Admitting: Family Medicine

## 2021-08-27 MED ORDER — ALPRAZOLAM 1 MG PO TABS: 1.0000 mg | ORAL_TABLET | Freq: Every evening | ORAL | 1 refills | Status: DC | PRN

## 2021-08-27 NOTE — Telephone Encounter (Signed)
Pharmacy faxed a refill request for ALPRAZolam Prudy Feeler) 1 MG tablet [[id number]]    Order Details Dose: 1 mg Route: Oral Frequency: At bedtime PRN  Dispense Quantity: 60 tablet Refills: 1    LOV: 08/31/20  NEXT SCHEDULED APPT: 09/07/21

## 2021-09-07 ENCOUNTER — Ambulatory Visit: Payer: BC Managed Care – PPO | Admitting: Family Medicine

## 2021-09-07 VITALS — BP 128/62 | HR 77 | Temp 97.9°F | Ht 66.5 in | Wt 221.2 lb

## 2021-09-07 DIAGNOSIS — E1149 Type 2 diabetes mellitus with other diabetic neurological complication: Secondary | ICD-10-CM

## 2021-09-07 DIAGNOSIS — Z23 Encounter for immunization: Secondary | ICD-10-CM | POA: Diagnosis not present

## 2021-09-07 DIAGNOSIS — K529 Noninfective gastroenteritis and colitis, unspecified: Secondary | ICD-10-CM | POA: Diagnosis not present

## 2021-09-07 NOTE — Progress Notes (Signed)
Subjective:    Patient ID: Alexandra Christensen, female    DOB: Nov 29, 1970, 51 y.o.   MRN: [id number]  HPI Patient presents today for follow-up of her diabetes.  She is currently on Ozempic 0.5 mg subcu weekly.  She is not checking her sugars regularly however she denies any polyuria polydipsia or blurry vision.  She does have neuropathy in both feet.  Diabetic foot exam was performed today.  She has excellent 2/4 dorsalis pedis and posterior tibialis pulses bilaterally however she has diminished sensation 10 g monofilament especially in her toes.  She denies any blurry vision.  She denies any abdominal pain.  She is currently being evaluated at [facility] for Crohn's disease.  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  Her gastroenterologist there has discussed starting her on Remicade so they would like her to start hepatitis B vaccination series.  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,  pt had TEE done 06-16-2013 normal;  (06-30-2020  no issue since and told unknown cause)   Type 2 diabetes mellitus ([place])    followed by pcp   (06-29-2020 pt stated does not check blood sugar at home)   Past Surgical History:  Procedure Laterality Date   ANAL FISTULOTOMY N/A 11/03/2019   Procedure: PARTIAL ANAL FISTULOTOMY;  Surgeon: Ileana Roup, MD;  [facility]: [facility];  Service: General;  Laterality: N/A;   COLONOSCOPY W/ POLYPECTOMY  10/05/2020   two small polyps in the tranverse colon   INCISION AND DRAINAGE ABSCESS N/A 07/05/2020    Procedure: INCISION AND [place];  Surgeon: Ileana Roup, MD;  Location: [place];  Service: General;  Laterality: N/A;   INCISION AND DRAINAGE ABSCESS N/A 04/23/2021   Procedure: INCISION AND DRAINAGE OF PERIRECTAL ABSCESS;  Surgeon: Ileana Roup, MD;  Location: [place];  Service: General;  Laterality: N/A;   [place] N/A 04/23/2021   Procedure: PLACEMENT OF SETONS TIMES TWO;  Surgeon: Ileana Roup, MD;  Location: [place];  Service: General;  Laterality: N/A;   RECTAL EXAM UNDER ANESTHESIA N/A 11/03/2019   Procedure: RECTAL EXAM UNDER ANESTHESIA;  Surgeon: Ileana Roup, MD;  [facility];  Service: General;  Laterality: N/A;   RECTAL EXAM UNDER ANESTHESIA N/A 04/23/2021   Procedure: ANORECTAL EXAM UNDER ANESTHESIA;  Surgeon: Ileana Roup, MD;  Location: [place];  Service: General;  Laterality: N/A;   TEE WITHOUT CARDIOVERSION N/A 06/16/2013   Procedure: TRANSESOPHAGEAL ECHOCARDIOGRAM (TEE);  Surgeon: Pixie Casino, MD;  [facility]: [facility] END[facility];  Service: Cardiovascular;  Laterality: N/A;   WISDOM TOOTH EXTRACTION     Current Outpatient Medications on File Prior to Visit  Medication Sig Dispense Refill   ALPRAZolam (XANAX) 1 MG tablet Take 1 tablet (1 mg  total) by mouth at bedtime as needed. for sleep 60 tablet 1   aluminum chloride (DRYSOL) 20 % external solution Apply topically at bedtime. 35 mL 0   atorvastatin (LIPITOR) 20 MG tablet Take 1 tablet (20 mg total) by mouth daily. 90 tablet 3   Blood Glucose Monitoring Suppl (BLOOD GLUCOSE SYSTEM PAK) KIT Dispense based on patient and insurance preference. Use to monitor FSBS 1x daily. Dx: E11.9 1 each 1   gabapentin (NEURONTIN) 300 MG capsule TAKE 1 CAPSULE BY MOUTH IN THE MORNING AND 2 AT BEDTIME 270 capsule 3   glucose blood (ONE TOUCH  ULTRA TEST) test strip USE TO CHECK FASTING BLOOD GLUCOSE ONCE DAILY 50 each 3   ibuprofen (ADVIL) 800 MG tablet Take 1 tablet (800 mg total) by mouth every 8 (eight) hours as needed. 90 tablet 0   Lancet Devices MISC Dispense based on patient and insurance preference. Use to monitor FSBS 1x daily. Dx: E11.9 1 each 1   Lancets MISC Dispense based on patient and insurance preference. Use to monitor FSBS 1x daily. Dx: E11.9 100 each 1   losartan (COZAAR) 25 MG tablet Take 1 tablet (25 mg total) by mouth daily. 90 tablet 2   Semaglutide,0.25 or 0.5MG/DOS, (OZEMPIC, 0.25 OR 0.5 MG/DOSE,) 2 MG/1.5ML SOPN Inject 0.25 mg into the skin once a week. 6 mL 0   vitamin B-12 (CYANOCOBALAMIN) 100 MCG tablet Take 100 mcg by mouth daily. Takes a couple of days a week, unsure of dosage.     No current facility-administered medications on file prior to visit.   Allergies  Allergen Reactions   Adhesive [Tape] Rash    Social History   Socioeconomic History   Marital status: Single    Spouse name: Not on file   Number of children: Not on file   Years of education: Not on file   Highest education level: Not on file  Occupational History   Not on file  Tobacco Use   Smoking status: Every Day    Packs/day: 0.50    Years: 30.00    Total pack years: 15.00    Types: Cigarettes   Smokeless tobacco: Never   Tobacco comments:    06-30-2020  pt stated 3ppwk, 04/18/21 pt stated about 6 cigarettes per day  Vaping Use   Vaping Use: Never used  Substance and Sexual Activity   Alcohol use: Yes    Comment: 4 to 5 drinks per week usually beer or wine   Drug use: No   Sexual activity: Yes    Birth control/protection: None  Other Topics Concern   Not on file  Social History Narrative   Not on file   Social Determinants of Health   Financial Resource Strain: Not on file  Food Insecurity: Not on file  Transportation Needs: Not on file  Physical Activity: Not on file  Stress: Not on file  Social Connections:  Not on file  Intimate Partner Violence: Not on file   Family History  Problem Relation Age of Onset   Diabetes Mother    Heart disease Mother    Kidney disease Father    Heart disease Father    Stroke Sister 61   Cancer Sister        AML      Review of Systems  All other systems reviewed and are negative.      Objective:   Physical Exam Vitals reviewed.  Cardiovascular:     Rate and Rhythm: Normal rate and regular  rhythm.     Heart sounds: Normal heart sounds.  Pulmonary:     Effort: Pulmonary effort is normal. No respiratory distress.     Breath sounds: Normal breath sounds. No stridor. No wheezing or rales.  Abdominal:     General: Bowel sounds are normal. There is no distension.     Palpations: Abdomen is soft. There is no mass.     Tenderness: There is no abdominal tenderness. There is no guarding or rebound.           Assessment & Plan:  Type 2 diabetes mellitus with neurological complications (HCC) - Plan: Hemoglobin A1c, CBC with Differential/Platelet, Lipid panel, COMPLETE METABOLIC PANEL WITH GFR, Microalbumin, urine Patient's blood pressure today is excellent.  Check a fasting lipid panel.  Goal LDL cholesterol is less than 100.  Check an A1c.  Ideally I like her A1c to be below 6.5.  We did discuss increasing the Ozempic to try to achieve additional weight loss.. Wt Readings from Last 3 Encounters:  09/07/21 221 lb 3.2 oz (100.3 kg)  04/23/21 226 lb 12.8 oz (102.9 kg)  01/19/21 229 lb 6.4 oz (104.1 kg)   Patient has lost 8 pounds since December of last year.  This has been on low-dose Ozempic.  However the patient did experience some nausea at 0.5 mg.  Await the results of her A1c to determine if her sugars are well controlled prior to making any additional recommendations.  Patient received the start of the hepatitis B vaccination series

## 2021-09-07 NOTE — Addendum Note (Signed)
Addended by: Venia Carbon K on: 09/07/2021 12:50 PM   Modules accepted: Orders

## 2021-09-08 LAB — CBC WITH DIFFERENTIAL/PLATELET
Absolute Monocytes: 555 cells/uL (ref 200–950)
Basophils Absolute: 78 cells/uL (ref 0–200)
Basophils Relative: 0.7 %
Eosinophils Absolute: 488 cells/uL (ref 15–500)
Eosinophils Relative: 4.4 %
HCT: 44.9 % (ref 35.0–45.0)
Hemoglobin: 15.1 g/dL (ref 11.7–15.5)
Lymphs Abs: 2631 cells/uL (ref 850–3900)
MCH: 30.3 pg (ref 27.0–33.0)
MCHC: 33.6 g/dL (ref 32.0–36.0)
MCV: 90.2 fL (ref 80.0–100.0)
MPV: 12.2 fL (ref 7.5–12.5)
Monocytes Relative: 5 %
Neutro Abs: 7348 cells/uL (ref 1500–7800)
Neutrophils Relative %: 66.2 %
Platelets: 340 10*3/uL (ref 140–400)
RBC: 4.98 10*6/uL (ref 3.80–5.10)
RDW: 12.9 % (ref 11.0–15.0)
Total Lymphocyte: 23.7 %
WBC: 11.1 10*3/uL — ABNORMAL HIGH (ref 3.8–10.8)

## 2021-09-08 LAB — HEMOGLOBIN A1C
Hgb A1c MFr Bld: 6 % of total Hgb — ABNORMAL HIGH (ref <5.7)
Mean Plasma Glucose: 126 mg/dL
eAG (mmol/L): 7 mmol/L

## 2021-09-08 LAB — COMPLETE METABOLIC PANEL WITH GFR
AG Ratio: 1.2 (calc) (ref 1.0–2.5)
ALT: 9 U/L (ref 6–29)
AST: 11 U/L (ref 10–35)
Albumin: 3.9 g/dL (ref 3.6–5.1)
Alkaline phosphatase (APISO): 102 U/L (ref 37–153)
BUN: 12 mg/dL (ref 7–25)
CO2: 28 mmol/L (ref 20–32)
Calcium: 9.7 mg/dL (ref 8.6–10.4)
Chloride: 104 mmol/L (ref 98–110)
Creat: 0.9 mg/dL (ref 0.50–1.03)
Globulin: 3.3 g/dL (calc) (ref 1.9–3.7)
Glucose, Bld: 106 mg/dL — ABNORMAL HIGH (ref 65–99)
Potassium: 4.6 mmol/L (ref 3.5–5.3)
Sodium: 139 mmol/L (ref 135–146)
Total Bilirubin: 0.4 mg/dL (ref 0.2–1.2)
Total Protein: 7.2 g/dL (ref 6.1–8.1)
eGFR: 76 mL/min/{1.73_m2} (ref >=60)

## 2021-09-08 LAB — LIPID PANEL
Cholesterol: 127 mg/dL (ref <200)
HDL: 41 mg/dL — ABNORMAL LOW (ref >=50)
LDL Cholesterol (Calc): 67 mg/dL (calc)
Non-HDL Cholesterol (Calc): 86 mg/dL (calc) (ref <130)
Total CHOL/HDL Ratio: 3.1 (calc) (ref <5.0)
Triglycerides: 107 mg/dL (ref <150)

## 2021-09-08 LAB — MICROALBUMIN, URINE: Microalb, Ur: 5.5 mg/dL

## 2021-09-25 DIAGNOSIS — R928 Other abnormal and inconclusive findings on diagnostic imaging of breast: Secondary | ICD-10-CM | POA: Insufficient documentation

## 2021-09-26 ENCOUNTER — Other Ambulatory Visit: Payer: Self-pay | Admitting: Obstetrics and Gynecology

## 2021-09-26 DIAGNOSIS — R928 Other abnormal and inconclusive findings on diagnostic imaging of breast: Secondary | ICD-10-CM

## 2021-10-08 ENCOUNTER — Ambulatory Visit (INDEPENDENT_AMBULATORY_CARE_PROVIDER_SITE_OTHER): Payer: BC Managed Care – PPO

## 2021-10-08 ENCOUNTER — Ambulatory Visit: Payer: BC Managed Care – PPO

## 2021-10-08 DIAGNOSIS — Z23 Encounter for immunization: Secondary | ICD-10-CM | POA: Diagnosis not present

## 2021-10-11 ENCOUNTER — Other Ambulatory Visit: Payer: Self-pay | Admitting: Obstetrics and Gynecology

## 2021-10-11 ENCOUNTER — Ambulatory Visit
Admission: RE | Admit: 2021-10-11 | Discharge: 2021-10-11 | Disposition: A | Discharge status: Home / Self Care | Payer: BC Managed Care – PPO | Source: Ambulatory Visit | Attending: Obstetrics and Gynecology | Admitting: Obstetrics and Gynecology

## 2021-10-11 ENCOUNTER — Ambulatory Visit: Payer: BC Managed Care – PPO

## 2021-10-11 DIAGNOSIS — R928 Other abnormal and inconclusive findings on diagnostic imaging of breast: Secondary | ICD-10-CM

## 2021-10-11 DIAGNOSIS — R921 Mammographic calcification found on diagnostic imaging of breast: Secondary | ICD-10-CM

## 2021-10-16 ENCOUNTER — Other Ambulatory Visit: Payer: Self-pay | Admitting: Family Medicine

## 2021-10-16 NOTE — Telephone Encounter (Signed)
Requested medication (s) are due for refill today: yes  Requested medication (s) are on the active medication list: yes    Last refill: 08/27/21 69ml 0 refills  Future visit scheduled no  Notes to clinic:Please review if refill appropriate. Note reads    NO FURTHER REFILLS. MUST ESTABLISH NEW PCP. Appears Dr. Tanya Nones is PCP?   Thank you!  Requested Prescriptions  Pending Prescriptions Disp Refills   OZEMPIC, 0.25 OR 0.5 MG/DOSE, 2 MG/3ML SOPN [Pharmacy Med Name: Ozempic (0.25 or 0.5 MG/DOSE) 2 MG/3ML Subcutaneous Solution Pen-injector] 3 mL 0    Sig: INJECT 0.5MG  SUBCUTANEOUSLY ONCE A WEEK ON MONDAY. NO FURTHER REFILLS. MUST ESTABLISH NEW PCP.     Endocrinology:  Diabetes - GLP-1 Receptor Agonists - semaglutide Failed - 10/16/2021  1:00 PM      Failed - HBA1C in normal range and within 180 days    Hgb A1c MFr Bld  Date Value Ref Range Status  09/07/2021 6.0 (H) <5.7 % of total Hgb Final    Comment:    For someone without known diabetes, a hemoglobin  A1c value between 5.7% and 6.4% is consistent with prediabetes and should be confirmed with a  follow-up test. . For someone with known diabetes, a value <7% indicates that their diabetes is well controlled. A1c targets should be individualized based on duration of diabetes, age, comorbid conditions, and other considerations. . This assay result is consistent with an increased risk of diabetes. . Currently, no consensus exists regarding use of hemoglobin A1c for diagnosis of diabetes for children. .          Failed - Valid encounter within last 6 months    Recent Outpatient Visits           9 months ago Folliculitis   [facility] Valentino Nose, NP   1 year ago Essential hypertension   [facility] [place], Priscille Heidelberg, MD   1 year ago Essential hypertension   [facility] [place], Velna Hatchet, MD   1 year ago Routine general medical examination at a health care facility    [facility] [place], Velna Hatchet, MD   2 years ago Infected sebaceous cyst   [facility] Donita Brooks, MD              Passed - Cr in normal range and within 360 days    Creat  Date Value Ref Range Status  09/07/2021 0.90 0.50 - 1.03 mg/dL Final   Creatinine, Urine  Date Value Ref Range Status  10/14/2018 142 20 - 275 mg/dL Final

## 2021-10-18 NOTE — Telephone Encounter (Signed)
Pls advice: see note re: PCP?

## 2021-10-24 ENCOUNTER — Telehealth: Payer: Self-pay

## 2021-10-24 NOTE — Telephone Encounter (Signed)
PA-Ozempic sent to [facility]

## 2021-10-25 NOTE — Telephone Encounter (Signed)
PA-Ozempic has been denied

## 2021-10-26 ENCOUNTER — Ambulatory Visit
Admission: RE | Admit: 2021-10-26 | Discharge: 2021-10-26 | Disposition: A | Discharge status: Home / Self Care | Payer: BC Managed Care – PPO | Source: Ambulatory Visit | Attending: Obstetrics and Gynecology | Admitting: Obstetrics and Gynecology

## 2021-10-26 DIAGNOSIS — R921 Mammographic calcification found on diagnostic imaging of breast: Secondary | ICD-10-CM

## 2021-11-09 ENCOUNTER — Ambulatory Visit: Payer: Self-pay | Admitting: Surgery

## 2021-11-09 ENCOUNTER — Telehealth: Payer: Self-pay

## 2021-11-09 DIAGNOSIS — N6091 Unspecified benign mammary dysplasia of right breast: Secondary | ICD-10-CM

## 2021-11-09 NOTE — Telephone Encounter (Signed)
Pt called stating that her Ozempic has been denied. Pt asks if there is another Rx that she can do instead fo the Ozempic? Thank you!

## 2021-11-12 ENCOUNTER — Other Ambulatory Visit: Payer: Self-pay | Admitting: Surgery

## 2021-11-12 DIAGNOSIS — N6091 Unspecified benign mammary dysplasia of right breast: Secondary | ICD-10-CM

## 2021-11-14 ENCOUNTER — Encounter (HOSPITAL_BASED_OUTPATIENT_CLINIC_OR_DEPARTMENT_OTHER): Payer: Self-pay | Admitting: Surgery

## 2021-11-14 ENCOUNTER — Other Ambulatory Visit: Payer: Self-pay

## 2021-11-14 NOTE — Progress Notes (Signed)
   11/14/21 1338  PAT Phone Screen  Do You Have Diabetes? (S)  Yes (has not been on Ozempic in over 2 wks)  Do You Have Hypertension? Yes  Have You Ever Been to the ER for Asthma? No  Have You Taken Oral Steroids in the Past 3 Months? No  Do you Take Phenteramine or any Other Diet Drugs? No  Recent  Lab Work, EKG, CXR? Yes  Where was this test performed? 09-07-21 A1c 6.0  Do you have a history of heart problems? No  Have You Ever Had Tests on Your Heart? Yes  Where? 2015 TEE EF 55-60%  Any Recent Hospitalizations? No  Height 5\' 6"  (1.676 m)  Weight 100.2 kg  Pat Appointment Scheduled (S)  Yes (coming in for PAT)

## 2021-11-16 ENCOUNTER — Encounter (HOSPITAL_BASED_OUTPATIENT_CLINIC_OR_DEPARTMENT_OTHER)
Admission: RE | Admit: 2021-11-16 | Discharge: 2021-11-16 | Disposition: A | Discharge status: Home / Self Care | Payer: BC Managed Care – PPO | Source: Ambulatory Visit | Attending: Surgery | Admitting: Surgery

## 2021-11-16 DIAGNOSIS — E1149 Type 2 diabetes mellitus with other diabetic neurological complication: Secondary | ICD-10-CM | POA: Diagnosis not present

## 2021-11-16 DIAGNOSIS — Z01818 Encounter for other preprocedural examination: Secondary | ICD-10-CM | POA: Insufficient documentation

## 2021-11-16 LAB — BASIC METABOLIC PANEL
Anion gap: 10 (ref 5–15)
BUN: 13 mg/dL (ref 6–20)
CO2: 25 mmol/L (ref 22–32)
Calcium: 9.4 mg/dL (ref 8.9–10.3)
Chloride: 104 mmol/L (ref 98–111)
Creatinine, Ser: 0.89 mg/dL (ref 0.44–1.00)
GFR, Estimated: >60 mL/min (ref >60)
Glucose, Bld: 92 mg/dL (ref 70–99)
Potassium: 4.8 mmol/L (ref 3.5–5.1)
Sodium: 139 mmol/L (ref 135–145)

## 2021-11-16 NOTE — Progress Notes (Signed)

## 2021-11-19 ENCOUNTER — Encounter: Payer: Self-pay | Admitting: Family Medicine

## 2021-11-19 ENCOUNTER — Ambulatory Visit (INDEPENDENT_AMBULATORY_CARE_PROVIDER_SITE_OTHER): Payer: BC Managed Care – PPO | Admitting: Family Medicine

## 2021-11-19 ENCOUNTER — Telehealth: Payer: Self-pay | Admitting: Family Medicine

## 2021-11-19 VITALS — BP 142/80 | HR 83 | Temp 97.7°F | Ht 66.0 in | Wt 224.0 lb

## 2021-11-19 DIAGNOSIS — H6690 Otitis media, unspecified, unspecified ear: Secondary | ICD-10-CM

## 2021-11-19 DIAGNOSIS — H9202 Otalgia, left ear: Secondary | ICD-10-CM | POA: Diagnosis not present

## 2021-11-19 MED ORDER — AMOXICILLIN 500 MG PO CAPS: 500.0000 mg | ORAL_CAPSULE | Freq: Two times a day (BID) | ORAL | 0 refills | Status: AC

## 2021-11-19 NOTE — Progress Notes (Signed)
Acute Office Visit  Subjective:     Patient ID: Alexandra Christensen, female    DOB: [birth year], 51 y.o.   MRN: [id number]  Chief Complaint  Patient presents with   ear infection    Alexandra Alexandra Christensen presents today with complains of left ear swelling and pain for 2.5 weeks. She tried peroxide and heat pad with mild relief. She has not been swimming, denies fevers or drainage, endorses throbbing pain 6/10, swelling, redness, jaw pain, and hearing loss. It is ongoing and not improving.  Otalgia  There is pain in the left ear. This is a new problem. The current episode started 1 to 4 weeks ago. The problem occurs constantly. The problem has been gradually worsening. There has been no fever. Associated symptoms include hearing loss. Pertinent negatives include no ear discharge, headaches, rhinorrhea or sore throat. She has tried heat packs for the symptoms. The treatment provided mild relief. ear infection years ago    Review of Systems  Constitutional: Negative.   HENT:  Positive for ear pain and hearing loss. Negative for ear discharge, rhinorrhea and sore throat.   Skin: Negative.   Neurological:  Negative for headaches.    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,  pt had TEE done 06-16-2013 normal;  (06-30-2020  no issue since and told unknown cause)   Type 2 diabetes mellitus ([place])    followed by pcp   (06-29-2020 pt stated does not check blood sugar at home)   Past Surgical History:  Procedure Laterality Date   ANAL FISTULOTOMY N/A 11/03/2019    Procedure: PARTIAL ANAL FISTULOTOMY;  Surgeon: Ileana Roup, MD;  [facility];  Service: General;  Laterality: N/A;   COLONOSCOPY W/ POLYPECTOMY  10/05/2020   two small polyps in the tranverse colon   INCISION AND DRAINAGE ABSCESS N/A 07/05/2020   Procedure: INCISION AND [place];  Surgeon: Ileana Roup, MD;  Location: [place];  Service: General;  Laterality: N/A;   INCISION AND DRAINAGE ABSCESS N/A 04/23/2021   Procedure: INCISION AND DRAINAGE OF PERIRECTAL ABSCESS;  Surgeon: Ileana Roup, MD;  Location: [place];  Service: General;  Laterality: N/A;   [place] N/A 04/23/2021   Procedure: PLACEMENT OF SETONS TIMES TWO;  Surgeon: Ileana Roup, MD;  Location: [place];  Service: General;  Laterality: N/A;   RECTAL EXAM UNDER ANESTHESIA N/A 11/03/2019   Procedure: RECTAL EXAM UNDER ANESTHESIA;  Surgeon: Ileana Roup, MD;  [facility];  Service: General;  Laterality: N/A;   RECTAL EXAM UNDER ANESTHESIA N/A 04/23/2021   Procedure: ANORECTAL EXAM UNDER ANESTHESIA;  Surgeon: Ileana Roup, MD;  Location: [place];  Service: General;  Laterality: N/A;   TEE WITHOUT CARDIOVERSION N/A 06/16/2013   Procedure: TRANSESOPHAGEAL  ECHOCARDIOGRAM (TEE);  Surgeon: Pixie Casino, MD;  Location: [facility] ENDOSCOPY;  Service: Cardiovascular;  Laterality: N/A;   WISDOM TOOTH EXTRACTION     Current Outpatient Medications on File Prior to Visit  Medication Sig Dispense Refill   ALPRAZolam (XANAX) 1 MG tablet Take 1 tablet (1 mg total) by mouth at bedtime as needed. for sleep 60 tablet 1   aluminum chloride (DRYSOL) 20 % external solution Apply topically at bedtime. 35 mL 0   atorvastatin (LIPITOR) 20 MG tablet Take 1 tablet (20 mg total) by mouth daily. 90 tablet 3   Blood Glucose Monitoring Suppl (BLOOD  GLUCOSE SYSTEM PAK) KIT Dispense based on patient and insurance preference. Use to monitor FSBS 1x daily. Dx: E11.9 1 each 1   gabapentin (NEURONTIN) 300 MG capsule TAKE 1 CAPSULE BY MOUTH IN THE MORNING AND 2 AT BEDTIME 270 capsule 3   glucose blood (ONE TOUCH ULTRA TEST) test strip USE TO CHECK FASTING BLOOD GLUCOSE ONCE DAILY 50 each 3   ibuprofen (ADVIL) 800 MG tablet Take 1 tablet (800 mg total) by mouth every 8 (eight) hours as needed. 90 tablet 0   Lancet Devices MISC Dispense based on patient and insurance preference. Use to monitor FSBS 1x daily. Dx: E11.9 1 each 1   Lancets MISC Dispense based on patient and insurance preference. Use to monitor FSBS 1x daily. Dx: E11.9 100 each 1   losartan (COZAAR) 25 MG tablet Take 1 tablet (25 mg total) by mouth daily. 90 tablet 2   OZEMPIC, 0.25 OR 0.5 MG/DOSE, 2 MG/3ML SOPN INJECT 0.5MG SUBCUTANEOUSLY ONCE A WEEK ON MONDAY. NO FURTHER REFILLS. MUST ESTABLISH NEW PCP. 3 mL 0   Semaglutide,0.25 or 0.5MG/DOS, (OZEMPIC, 0.25 OR 0.5 MG/DOSE,) 2 MG/1.5ML SOPN Inject 0.25 mg into the skin once a week. 6 mL 0   vitamin B-12 (CYANOCOBALAMIN) 100 MCG tablet Take 100 mcg by mouth daily. Takes a couple of days a week, unsure of dosage.     No current facility-administered medications on file prior to visit.   Allergies  Allergen Reactions   Adhesive [Tape] Rash        Objective:    BP (!) 142/80   Pulse 83   Temp 97.7 F (36.5 C) (Oral)   Ht 5' 6"  (1.676 m)   Wt 224 lb (101.6 kg)   LMP  (LMP Unknown) Comment: per pt, sometime in 2022 / pt is not sure if it has been a year since her last period 04/18/21 pt states over a year since she had a period  SpO2 97%   BMI 36.15 kg/m    Physical Exam Constitutional:      Appearance: Normal appearance.  HENT:     Right Ear: Tympanic membrane, ear canal and external ear normal.     Left Ear: Swelling and tenderness present. No drainage. A middle ear effusion is present. Tympanic membrane is erythematous  and bulging.  Neurological:     Mental Status: She is alert.     No results found for any visits on 11/19/21.      Assessment & Plan:   Acute otitis media, unspecified otitis media type Alexandra Christensen presents with left ear OM with a bulging and erythematous tympanic membrane. Start Amoxicillin 544m every 12 hours for 7 days. Return to office if symptoms worsen or persist.  Ear pain, left    Meds ordered this encounter  Medications   amoxicillin (AMOXIL) 500 MG capsule    Sig: Take 1 capsule (  500 mg total) by mouth 2 (two) times daily for 7 days.    Dispense:  14 capsule    Refill:  0    Order Specific Question:   Supervising Provider    Answer:   Jenna Luo T [[id number]]    Return if symptoms worsen or fail to improve.  Rubie Maid, FNP

## 2021-11-19 NOTE — Telephone Encounter (Signed)
Patient requesting call back with status of refill of diabetes med; stated she's been waiting for a refill for about 3 weeks.  Please advise at [phone].

## 2021-11-20 ENCOUNTER — Ambulatory Visit
Admission: RE | Admit: 2021-11-20 | Discharge: 2021-11-20 | Disposition: A | Discharge status: Home / Self Care | Payer: BC Managed Care – PPO | Source: Ambulatory Visit | Attending: Surgery | Admitting: Surgery

## 2021-11-20 DIAGNOSIS — N6091 Unspecified benign mammary dysplasia of right breast: Secondary | ICD-10-CM

## 2021-11-21 ENCOUNTER — Ambulatory Visit
Admission: RE | Admit: 2021-11-21 | Discharge: 2021-11-21 | Disposition: A | Discharge status: Home / Self Care | Payer: BC Managed Care – PPO | Source: Ambulatory Visit | Attending: Surgery | Admitting: Surgery

## 2021-11-21 ENCOUNTER — Other Ambulatory Visit: Payer: Self-pay

## 2021-11-21 ENCOUNTER — Ambulatory Visit (HOSPITAL_BASED_OUTPATIENT_CLINIC_OR_DEPARTMENT_OTHER)
Admission: RE | Admit: 2021-11-21 | Discharge: 2021-11-21 | Disposition: A | Discharge status: Home / Self Care | Payer: BC Managed Care – PPO | Attending: Surgery | Admitting: Surgery

## 2021-11-21 ENCOUNTER — Encounter (HOSPITAL_BASED_OUTPATIENT_CLINIC_OR_DEPARTMENT_OTHER): Payer: Self-pay | Admitting: Surgery

## 2021-11-21 ENCOUNTER — Encounter (HOSPITAL_BASED_OUTPATIENT_CLINIC_OR_DEPARTMENT_OTHER)
Admission: RE | Disposition: A | Discharge status: Home / Self Care | Payer: Self-pay | Source: Home / Self Care | Attending: Surgery

## 2021-11-21 ENCOUNTER — Ambulatory Visit (HOSPITAL_BASED_OUTPATIENT_CLINIC_OR_DEPARTMENT_OTHER): Payer: BC Managed Care – PPO | Admitting: Anesthesiology

## 2021-11-21 DIAGNOSIS — E119 Type 2 diabetes mellitus without complications: Secondary | ICD-10-CM | POA: Diagnosis not present

## 2021-11-21 DIAGNOSIS — I10 Essential (primary) hypertension: Secondary | ICD-10-CM | POA: Insufficient documentation

## 2021-11-21 DIAGNOSIS — E1149 Type 2 diabetes mellitus with other diabetic neurological complication: Secondary | ICD-10-CM

## 2021-11-21 DIAGNOSIS — F17210 Nicotine dependence, cigarettes, uncomplicated: Secondary | ICD-10-CM | POA: Diagnosis not present

## 2021-11-21 DIAGNOSIS — N6091 Unspecified benign mammary dysplasia of right breast: Secondary | ICD-10-CM

## 2021-11-21 DIAGNOSIS — F419 Anxiety disorder, unspecified: Secondary | ICD-10-CM | POA: Insufficient documentation

## 2021-11-21 DIAGNOSIS — G709 Myoneural disorder, unspecified: Secondary | ICD-10-CM | POA: Diagnosis not present

## 2021-11-21 HISTORY — PX: BREAST LUMPECTOMY WITH RADIOACTIVE SEED LOCALIZATION: SHX6424

## 2021-11-21 HISTORY — DX: Unspecified benign mammary dysplasia of right breast: N60.91

## 2021-11-21 HISTORY — DX: Anxiety disorder, unspecified: F41.9

## 2021-11-21 LAB — GLUCOSE, CAPILLARY: Glucose-Capillary: 115 mg/dL — ABNORMAL HIGH (ref 70–99)

## 2021-11-21 SURGERY — BREAST LUMPECTOMY WITH RADIOACTIVE SEED LOCALIZATION
Anesthesia: General | Site: Breast | Laterality: Right

## 2021-11-21 MED ORDER — CHLORHEXIDINE GLUCONATE CLOTH 2 % EX PADS: 6.0000 | MEDICATED_PAD | Freq: Once | CUTANEOUS | Status: DC

## 2021-11-21 MED ORDER — ONDANSETRON HCL 4 MG/2ML IJ SOLN
INTRAMUSCULAR | Status: AC
  Filled 2021-11-21: qty 2

## 2021-11-21 MED ORDER — MIDAZOLAM HCL 5 MG/5ML IJ SOLN
INTRAMUSCULAR | Status: DC | PRN
  Administered 2021-11-21: 2 mg via INTRAVENOUS

## 2021-11-21 MED ORDER — FENTANYL CITRATE (PF) 100 MCG/2ML IJ SOLN
INTRAMUSCULAR | Status: AC
  Filled 2021-11-21: qty 2

## 2021-11-21 MED ORDER — EPHEDRINE SULFATE (PRESSORS) 50 MG/ML IJ SOLN
INTRAMUSCULAR | Status: DC | PRN
  Administered 2021-11-21: 10 mg via INTRAVENOUS

## 2021-11-21 MED ORDER — DEXAMETHASONE SODIUM PHOSPHATE 4 MG/ML IJ SOLN
INTRAMUSCULAR | Status: DC | PRN
  Administered 2021-11-21: 4 mg via INTRAVENOUS

## 2021-11-21 MED ORDER — ACETAMINOPHEN 500 MG PO TABS
1000.0000 mg | ORAL_TABLET | ORAL | Status: AC
  Administered 2021-11-21: 1000 mg via ORAL

## 2021-11-21 MED ORDER — SODIUM CHLORIDE 0.9 % IV SOLN
INTRAVENOUS | Status: DC | PRN
  Administered 2021-11-21: 500 mL

## 2021-11-21 MED ORDER — LIDOCAINE 2% (20 MG/ML) 5 ML SYRINGE
INTRAMUSCULAR | Status: DC | PRN
  Administered 2021-11-21: 60 mg via INTRAVENOUS

## 2021-11-21 MED ORDER — ONDANSETRON HCL 4 MG/2ML IJ SOLN: 4.0000 mg | Freq: Once | INTRAMUSCULAR | Status: DC | PRN

## 2021-11-21 MED ORDER — ONDANSETRON HCL 4 MG/2ML IJ SOLN
INTRAMUSCULAR | Status: DC | PRN
  Administered 2021-11-21: 4 mg via INTRAVENOUS

## 2021-11-21 MED ORDER — DEXAMETHASONE SODIUM PHOSPHATE 10 MG/ML IJ SOLN
INTRAMUSCULAR | Status: AC
  Filled 2021-11-21: qty 1

## 2021-11-21 MED ORDER — FENTANYL CITRATE (PF) 100 MCG/2ML IJ SOLN
INTRAMUSCULAR | Status: DC | PRN
  Administered 2021-11-21: 100 ug via INTRAVENOUS

## 2021-11-21 MED ORDER — MIDAZOLAM HCL 2 MG/2ML IJ SOLN
INTRAMUSCULAR | Status: AC
  Filled 2021-11-21: qty 2

## 2021-11-21 MED ORDER — ACETAMINOPHEN 500 MG PO TABS
ORAL_TABLET | ORAL | Status: AC
  Filled 2021-11-21: qty 2

## 2021-11-21 MED ORDER — PROPOFOL 10 MG/ML IV BOLUS
INTRAVENOUS | Status: DC | PRN
  Administered 2021-11-21: 200 mg via INTRAVENOUS

## 2021-11-21 MED ORDER — CEFAZOLIN SODIUM-DEXTROSE 2-4 GM/100ML-% IV SOLN
INTRAVENOUS | Status: AC
  Filled 2021-11-21: qty 100

## 2021-11-21 MED ORDER — ACETAMINOPHEN 160 MG/5ML PO SOLN: 325.0000 mg | ORAL | Status: DC | PRN

## 2021-11-21 MED ORDER — BUPIVACAINE-EPINEPHRINE (PF) 0.25% -1:200000 IJ SOLN
INTRAMUSCULAR | Status: DC | PRN
  Administered 2021-11-21: 27 mL

## 2021-11-21 MED ORDER — OXYCODONE HCL 5 MG/5ML PO SOLN: 5.0000 mg | Freq: Once | ORAL | Status: DC | PRN

## 2021-11-21 MED ORDER — LACTATED RINGERS IV SOLN: INTRAVENOUS | Status: DC

## 2021-11-21 MED ORDER — OXYCODONE HCL 5 MG PO TABS: 5.0000 mg | ORAL_TABLET | Freq: Once | ORAL | Status: DC | PRN

## 2021-11-21 MED ORDER — FENTANYL CITRATE (PF) 100 MCG/2ML IJ SOLN: 25.0000 ug | INTRAMUSCULAR | Status: DC | PRN

## 2021-11-21 MED ORDER — IBUPROFEN 800 MG PO TABS: 800.0000 mg | ORAL_TABLET | Freq: Three times a day (TID) | ORAL | 0 refills | Status: AC | PRN

## 2021-11-21 MED ORDER — CEFAZOLIN IN SODIUM CHLORIDE 3-0.9 GM/100ML-% IV SOLN
3.0000 g | INTRAVENOUS | Status: AC
  Administered 2021-11-21: 2 g via INTRAVENOUS

## 2021-11-21 MED ORDER — ACETAMINOPHEN 325 MG PO TABS: 325.0000 mg | ORAL_TABLET | ORAL | Status: DC | PRN

## 2021-11-21 MED ORDER — PROPOFOL 500 MG/50ML IV EMUL
INTRAVENOUS | Status: AC
  Filled 2021-11-21: qty 50

## 2021-11-21 MED ORDER — OXYCODONE HCL 5 MG PO TABS: 5.0000 mg | ORAL_TABLET | Freq: Four times a day (QID) | ORAL | 0 refills | Status: AC | PRN

## 2021-11-21 MED ORDER — MEPERIDINE HCL 25 MG/ML IJ SOLN: 6.2500 mg | INTRAMUSCULAR | Status: DC | PRN

## 2021-11-21 SURGICAL SUPPLY — 45 items
ADH SKN CLS APL DERMABOND .7 (GAUZE/BANDAGES/DRESSINGS) ×1
APL PRP STRL LF DISP 70% ISPRP (MISCELLANEOUS) ×1
APPLIER CLIP 9.375 MED OPEN (MISCELLANEOUS)
APR CLP MED 9.3 20 MLT OPN (MISCELLANEOUS)
BINDER BREAST XXLRG (GAUZE/BANDAGES/DRESSINGS) IMPLANT
BLADE SURG 15 STRL LF DISP TIS (BLADE) ×1 IMPLANT
BLADE SURG 15 STRL SS (BLADE) ×1
CANISTER SUC SOCK COL 7IN (MISCELLANEOUS) IMPLANT
CANISTER SUCT 1200ML W/VALVE (MISCELLANEOUS) IMPLANT
CHLORAPREP W/TINT 26 (MISCELLANEOUS) ×1 IMPLANT
CLIP APPLIE 9.375 MED OPEN (MISCELLANEOUS) IMPLANT
COVER BACK TABLE 60X90IN (DRAPES) ×1 IMPLANT
COVER MAYO STAND STRL (DRAPES) ×1 IMPLANT
COVER PROBE W GEL 5X96 (DRAPES) ×1 IMPLANT
DERMABOND ADVANCED .7 DNX12 (GAUZE/BANDAGES/DRESSINGS) ×1 IMPLANT
DRAPE LAPAROTOMY 100X72 PEDS (DRAPES) ×1 IMPLANT
DRAPE UTILITY XL STRL (DRAPES) ×1 IMPLANT
ELECT COATED BLADE 2.86 ST (ELECTRODE) ×1 IMPLANT
ELECT REM PT RETURN 9FT ADLT (ELECTROSURGICAL) ×1
ELECTRODE REM PT RTRN 9FT ADLT (ELECTROSURGICAL) ×1 IMPLANT
GLOVE BIOGEL PI IND STRL 8 (GLOVE) ×1 IMPLANT
GLOVE ECLIPSE 8.0 STRL XLNG CF (GLOVE) ×1 IMPLANT
GOWN STRL REUS W/ TWL LRG LVL3 (GOWN DISPOSABLE) ×2 IMPLANT
GOWN STRL REUS W/ TWL XL LVL3 (GOWN DISPOSABLE) ×1 IMPLANT
GOWN STRL REUS W/TWL LRG LVL3 (GOWN DISPOSABLE) ×2
GOWN STRL REUS W/TWL XL LVL3 (GOWN DISPOSABLE) ×1
HEMOSTAT ARISTA ABSORB 3G PWDR (HEMOSTASIS) IMPLANT
HEMOSTAT SNOW SURGICEL 2X4 (HEMOSTASIS) IMPLANT
KIT MARKER MARGIN INK (KITS) ×1 IMPLANT
NDL HYPO 25X1 1.5 SAFETY (NEEDLE) ×1 IMPLANT
NEEDLE HYPO 25X1 1.5 SAFETY (NEEDLE) ×1 IMPLANT
NS IRRIG 1000ML POUR BTL (IV SOLUTION) ×1 IMPLANT
PACK BASIN DAY SURGERY FS (CUSTOM PROCEDURE TRAY) ×1 IMPLANT
PENCIL SMOKE EVACUATOR (MISCELLANEOUS) ×1 IMPLANT
SLEEVE SCD COMPRESS KNEE MED (STOCKING) ×1 IMPLANT
SPIKE FLUID TRANSFER (MISCELLANEOUS) IMPLANT
SPONGE T-LAP 4X18 ~~LOC~~+RFID (SPONGE) ×1 IMPLANT
SUT MNCRL AB 4-0 PS2 18 (SUTURE) ×1 IMPLANT
SUT SILK 2 0 SH (SUTURE) IMPLANT
SUT VICRYL 3-0 CR8 SH (SUTURE) ×1 IMPLANT
SYR CONTROL 10ML LL (SYRINGE) ×1 IMPLANT
TOWEL GREEN STERILE FF (TOWEL DISPOSABLE) ×1 IMPLANT
TRAY FAXITRON CT DISP (TRAY / TRAY PROCEDURE) ×1 IMPLANT
TUBE CONNECTING 20X1/4 (TUBING) IMPLANT
YANKAUER SUCT BULB TIP NO VENT (SUCTIONS) IMPLANT

## 2021-11-21 NOTE — Anesthesia Procedure Notes (Signed)
Procedure Name: LMA Insertion Date/Time: 11/21/2021 12:33 PM  Performed by: Maryella Shivers, CRNAPre-anesthesia Checklist: Patient identified, Emergency Drugs available, Suction available and Patient being monitored Patient Re-evaluated:Patient Re-evaluated prior to induction Oxygen Delivery Method: Circle system utilized Preoxygenation: Pre-oxygenation with 100% oxygen Induction Type: IV induction Ventilation: Mask ventilation without difficulty LMA: LMA inserted LMA Size: 4.0 Number of attempts: 1 Airway Equipment and Method: Bite block Placement Confirmation: positive ETCO2 Tube secured with: Tape Dental Injury: Teeth and Oropharynx as per pre-operative assessment

## 2021-11-21 NOTE — Interval H&P Note (Signed)
History and Physical Interval Note:  11/21/2021 12:00 PM  Alexandra Christensen  has presented today for surgery, with the diagnosis of ATYPICAL DUCTAL HYPERPLASIA.  The various methods of treatment have been discussed with the patient and family. After consideration of risks, benefits and other options for treatment, the patient has consented to  Procedure(s): RIGHT BREAST LUMPECTOMY WITH RADIOACTIVE SEED LOCALIZATION (Right) as a surgical intervention.  The patient's history has been reviewed, patient examined, no change in status, stable for surgery.  I have reviewed the patient's chart and labs.  Questions were answered to the patient's satisfaction.   The procedure has been discussed with the patient. Alternatives to surgery have been discussed with the patient.  Risks of surgery include bleeding,  Infection,  Seroma formation, death,  and the need for further surgery.   The patient understands and wishes to proceed.   Turner Daniels MD

## 2021-11-21 NOTE — Op Note (Signed)
Preoperative diagnosis: Right breast atypical ductal hyperplasia  Postoperative diagnosis: Same  Procedure: Right breast seed localized lumpectomy  Surgeon: Erroll Luna, MD  Anesthesia: LMA with 0.25% Marcaine with epinephrine local  EBL: 20 cc  Specimen: Right breast tissue with seed and clip verified by Faxitron  Indications for procedure: The patient is a 51 year old female who had a screen detected right breast mammographic abnormality core biopsy which showed atypical duct hyperplasia.  We discussed the natural history as well as high risk nature of the lesion.  We discussed potential upgrade risk of 20% or more.  We discussed lumpectomy as a potential step in evaluating his for invasive cancer.  She agreed to proceed for right breast seed localized lumpectomy.The procedure has been discussed with the patient. Alternatives to surgery have been discussed with the patient.  Risks of surgery include bleeding,  Infection,  Seroma formation, death,  and the need for further surgery.   The patient understands and wishes to proceed.     Description of procedure: The patient was met in the holding area and questions were answered.  The right breast was marked as the correct site.  Seed was placed as an outpatient.  She was then taken back to the operative room.  She is placed supine upon the OR table.  After induction general esthesia right breast was prepped and draped in sterile fashion timeout performed.  The seed was identified with the probe and films.  Incision made in the upper central breast after timeout and dissection was carried down through the subcutaneous tissues.  Tissue around the seed and clip were located and excised with a grossly negative margin.  Hemostasis achieved with cautery.  Irrigation used.  There were some small bleeding vessels oversewn with 3-0 Vicryl.  The Faxitron ring image revealed the seed and clip be present.  Tissue oriented with ink and sent to pathology.   After ensuring hemostasis local anesthetic was infiltrated to the cavity and the cavity closed with 3-0 Vicryl.  4 Monocryl was used to close the skin in a subcuticular fashion.  Dermabond applied.  Breast binder placed.  All counts found to be correct.  Patient awoke extubated taken recovery in satisfactory condition.

## 2021-11-21 NOTE — Anesthesia Postprocedure Evaluation (Signed)
Anesthesia Post Note  Patient: Alexandra Christensen  Procedure(s) Performed: RIGHT BREAST LUMPECTOMY WITH RADIOACTIVE SEED LOCALIZATION (Right: Breast)     Patient location during evaluation: PACU Anesthesia Type: General Level of consciousness: awake and alert Pain management: pain level controlled Vital Signs Assessment: post-procedure vital signs reviewed and stable Respiratory status: spontaneous breathing, nonlabored ventilation, respiratory function stable and patient connected to nasal cannula oxygen Cardiovascular status: blood pressure returned to baseline and stable Postop Assessment: no apparent nausea or vomiting Anesthetic complications: no   No notable events documented.  Last Vitals:  Vitals:   11/21/21 1400 11/21/21 1414  BP: 105/64 (!) 146/94  Pulse: 69 77  Resp: 11 14  Temp:  36.6 C  SpO2: 94% 94%    Last Pain:  Vitals:   11/21/21 1414  TempSrc:   PainSc: 3                  Kasmira Cacioppo

## 2021-11-21 NOTE — Anesthesia Preprocedure Evaluation (Addendum)
Anesthesia Evaluation  Patient identified by MRN, date of birth, ID band Patient awake    Reviewed: Allergy & Precautions, NPO status , Patient's Chart, lab work & pertinent test results  History of Anesthesia Complications (+) Family history of anesthesia reactionNegative for: history of anesthetic complications  Airway Mallampati: III  TM Distance: >3 FB Neck ROM: Full    Dental  (+) Teeth Intact, Dental Advisory Given   Pulmonary Current Smoker,    breath sounds clear to auscultation       Cardiovascular hypertension, Pt. on medications (-) angina Rhythm:Regular  Left ventricle: The cavity size was normal. Wall thickness  was normal. Systolic function was normal. The estimated  ejection fraction was in the range of 55% to 60%. Wall  motion was normal; there were no regional wall motion  abnormalities.  - Aortic valve: No evidence of vegetation.  - Mitral valve: No evidence of vegetation. Mild  regurgitation.  - Left atrium: No evidence of thrombus in the atrial cavity  or appendage.  - Right atrium: No evidence of thrombus in the atrial cavity  or appendage.  - Atrial septum: No defect or patent foramen ovale was  identified. Echo contrast study showed no right-to-left  atrial level shunt, following an increase in RA pressure  induced by provocative maneuvers.  - Tricuspid valve: No evidence of vegetation.  - Pulmonic valve: No evidence of vegetation.     Neuro/Psych  Headaches, PSYCHIATRIC DISORDERS Anxiety  Neuromuscular disease    GI/Hepatic negative GI ROS, Neg liver ROS,   Endo/Other  diabetes, Type 2  Renal/GU      Musculoskeletal negative musculoskeletal ROS (+)   Abdominal   Peds  Hematology negative hematology ROS (+)   Anesthesia Other Findings   Reproductive/Obstetrics                            Anesthesia Physical  Anesthesia Plan  ASA:  3  Anesthesia Plan: General   Post-op Pain Management:    Induction: Intravenous  PONV Risk Score and Plan: 2 and Ondansetron and Dexamethasone  Airway Management Planned: LMA  Additional Equipment: None  Intra-op Plan:   Post-operative Plan: Extubation in OR  Informed Consent: I have reviewed the patients History and Physical, chart, labs and discussed the procedure including the risks, benefits and alternatives for the proposed anesthesia with the patient or authorized representative who has indicated his/her understanding and acceptance.     Dental advisory given  Plan Discussed with: CRNA and Anesthesiologist  Anesthesia Plan Comments:         Anesthesia Quick Evaluation

## 2021-11-21 NOTE — Transfer of Care (Signed)
Immediate Anesthesia Transfer of Care Note  Patient: Beatriz Stallion  Procedure(s) Performed: RIGHT BREAST LUMPECTOMY WITH RADIOACTIVE SEED LOCALIZATION (Right: Breast)  Patient Location: PACU  Anesthesia Type:General  Level of Consciousness: sedated  Airway & Oxygen Therapy: Patient Spontanous Breathing and Patient connected to face mask oxygen  Post-op Assessment: Report given to RN and Post -op Vital signs reviewed and stable  Post vital signs: Reviewed and stable  Last Vitals:  Vitals Value Taken Time  BP 104/61 11/21/21 1336  Temp    Pulse 64 11/21/21 1338  Resp 12 11/21/21 1338  SpO2 95 % 11/21/21 1338  Vitals shown include unvalidated device data.  Last Pain:  Vitals:   11/21/21 1132  TempSrc: Oral  PainSc: 0-No pain      Patients Stated Pain Goal: 3 (58/85/02 7741)  Complications: No notable events documented.

## 2021-11-21 NOTE — Discharge Instructions (Addendum)
[facility] Office Phone Number [phone]  BREAST BIOPSY/ PARTIAL MASTECTOMY: POST OP INSTRUCTIONS  Always review your discharge instruction sheet given to you by the facility where your surgery was performed.  IF YOU HAVE DISABILITY OR FAMILY LEAVE FORMS, YOU MUST BRING THEM TO THE OFFICE FOR PROCESSING.  DO NOT GIVE THEM TO YOUR DOCTOR.  A prescription for pain medication may be given to you upon discharge.  Take your pain medication as prescribed, if needed.  If narcotic pain medicine is not needed, then you may take acetaminophen (Tylenol) or ibuprofen (Advil) as needed. May have Tylenol after 5:45 PM today and ibuprofen starting at any time.   Take your usually prescribed medications unless otherwise directed If you need a refill on your pain medication, please contact your pharmacy.  They will contact our office to request authorization.  Prescriptions will not be filled after 5pm or on week-ends. You should eat very light the first 24 hours after surgery, such as soup, crackers, pudding, etc.  Resume your normal diet the day after surgery. Most patients will experience some swelling and bruising in the breast.  Ice packs and a good support bra will help.  Swelling and bruising can take several days to resolve.  It is common to experience some constipation if taking pain medication after surgery.  Increasing fluid intake and taking a stool softener will usually help or prevent this problem from occurring.  A mild laxative (Milk of Magnesia or Miralax) should be taken according to package directions if there are no bowel movements after 48 hours. Unless discharge instructions indicate otherwise, you may remove your bandages 24-48 hours after surgery, and you may shower at that time.  You may have steri-strips (small skin tapes) in place directly over the incision.  These strips should be left on the skin for 7-10 days.  If your surgeon used skin glue on the incision, you may  shower in 24 hours.  The glue will flake off over the next 2-3 weeks.  Any sutures or staples will be removed at the office during your follow-up visit. ACTIVITIES:  You may resume regular daily activities (gradually increasing) beginning the next day.  Wearing a good support bra or sports bra minimizes pain and swelling.  You may have sexual intercourse when it is comfortable. You may drive when you no longer are taking prescription pain medication, you can comfortably wear a seatbelt, and you can safely maneuver your car and apply brakes. RETURN TO WORK:  ______________________________________________________________________________________ Alexandra Christensen should see your doctor in the office for a follow-up appointment approximately two weeks after your surgery.  Your doctor's nurse will typically make your follow-up appointment when she calls you with your pathology report.  Expect your pathology report 2-3 business days after your surgery.  You may call to check if you do not hear from [place] after three days. OTHER INSTRUCTIONS: _______________________________________________________________________________________________ _____________________________________________________________________________________________________________________________________ _____________________________________________________________________________________________________________________________________ _____________________________________________________________________________________________________________________________________  WHEN TO CALL YOUR DOCTOR: Fever over 101.0 Nausea and/or vomiting. Extreme swelling or bruising. Continued bleeding from incision. Increased pain, redness, or drainage from the incision.  The clinic staff is available to answer your questions during regular business hours.  Please don't hesitate to call and ask to speak to one of the nurses for clinical concerns.  If you have a medical emergency,  go to the nearest emergency room or call 911.  A surgeon from [facility] is always on call at the hospital.  For further questions, please visit [email or web address]  Post Anesthesia Home Care Instructions  Activity: Get plenty of rest for the remainder of the day. A responsible individual must stay with you for 24 hours following the procedure.  For the next 24 hours, DO NOT: -Drive a car -Paediatric nurse -Drink alcoholic beverages -Take any medication unless instructed by your physician -Make any legal decisions or sign important papers.  Meals: Start with liquid foods such as gelatin or soup. Progress to regular foods as tolerated. Avoid greasy, spicy, heavy foods. If nausea and/or vomiting occur, drink only clear liquids until the nausea and/or vomiting subsides. Call your physician if vomiting continues.  Special Instructions/Symptoms: Your throat may feel dry or sore from the anesthesia or the breathing tube placed in your throat during surgery. If this causes discomfort, gargle with warm salt water. The discomfort should disappear within 24 hours.

## 2021-11-21 NOTE — H&P (Signed)
History of Present Illness: Alexandra Christensen is a 51 y.o. female who is seen today as an office consultation for evaluation of No chief complaint on file. .   Patient presents for evaluation of abnormal right breast mammogram. There is an area of calcifications core biopsy proven to be atypical ductal hyperplasia. No history of breast pain, breast mass or nipple discharge. No family history of breast cancer.  Review of Systems: A complete review of systems was obtained from the patient. I have reviewed this information and discussed as appropriate with the patient. See HPI as well for other ROS.    Medical History: No past medical history on file.  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   Allergies  Allergen Reactions  Tapentadol Rash   Current Outpatient Medications on File Prior to Visit  Medication Sig Dispense Refill  ALPRAZolam (XANAX) 1 MG tablet Take 1 mg by mouth nightly as needed  atorvastatin (LIPITOR) 20 MG tablet Take 20 mg by mouth once daily  gabapentin (NEURONTIN) 300 MG capsule TAKE 1 CAPSULE BY MOUTH IN THE MORNING AND 2 CAPSULES AT BEDTIME  losartan (COZAAR) 25 MG tablet Take 25 mg by mouth once daily  semaglutide (OZEMPIC) 0.25 mg or 0.5 mg(2 mg/1.5 mL) pen injector Inject subcutaneously   No current facility-administered medications on file prior to visit.   Family History  Problem Relation Age of Onset  Stroke Mother  Obesity Mother  High blood pressure (Hypertension) Mother  Hyperlipidemia (Elevated cholesterol) Mother  Coronary Artery Disease (Blocked arteries around heart) Mother   Diabetes Mother  Stroke Father  Skin cancer Father  Obesity Father  High blood pressure (Hypertension) Father  Hyperlipidemia (Elevated cholesterol) Father  Diabetes Father  Coronary Artery Disease (Blocked arteries around heart) Father  Stroke Sister  High blood pressure (Hypertension) Sister  Diabetes Sister  Obesity Brother  Hyperlipidemia (Elevated cholesterol) Brother  Diabetes Brother    Social History   Tobacco Use  Smoking Status Every Day  Packs/day: 0.20  Types: Cigarettes  Smokeless Tobacco Never    Social History   Socioeconomic History  Marital status: Single  Tobacco Use  Smoking status: Every Day  Packs/day: 0.20  Types: Cigarettes  Smokeless tobacco: Never  Vaping Use  Vaping Use: Never used  Substance and Sexual Activity  Alcohol use: Never  Drug use: Never   Objective:  There were no vitals filed for this visit.  There is no height or weight on file to calculate BMI.  Physical Exam HENT:  Head: Normocephalic.  Cardiovascular:  Rate and Rhythm: Normal rate.  Pulmonary:  Effort: Pulmonary effort is normal.  Breath sounds: No stridor.  Chest:  Breasts: Right: Normal. No inverted nipple or mass.  Left: Normal. No inverted nipple or mass.  Lymphadenopathy:  Upper Body:  Right upper body: No supraclavicular or axillary adenopathy.  Left upper body: No supraclavicular or axillary adenopathy.  Neurological:  Mental Status: She is alert.    Labs, Imaging and Diagnostic Testing: CLINICAL DATA: The patient was called back for right breast calcifications, a right breast asymmetry and a left breast asymmetry.  EXAM: DIGITAL DIAGNOSTIC BILATERAL MAMMOGRAM WITH TOMOSYNTHESIS; ULTRASOUND LEFT BREAST  LIMITED  TECHNIQUE: Bilateral digital diagnostic mammography and breast tomosynthesis was performed.; Targeted ultrasound examination of the left breast was performed.  COMPARISON: Previous exam(s).  ACR Breast Density Category b: There  are scattered areas of fibroglandular density.  FINDINGS: Calcifications in the medial superior right breast are best identified on cc imaging, spanning up to 5 mm, coarsened heterogeneous.  The asymmetry on the right was more conspicuous on screening images due to difference in technique. On today's imaging, the asymmetry appear similar compared to multiple previous years.  The asymmetry in the medial left breast appears to represent a mass only seen on the cc view.  On physical exam, no suspicious lumps are identified.  Targeted ultrasound is performed, showing focal fibrocystic changes in the medial left breast accounting for the left breast mass.  IMPRESSION: Indeterminate right breast calcifications. Probably benign right breast asymmetry. Fibrocystic changes on the left.  RECOMMENDATION: Recommend stereotactic biopsy of the right breast calcifications. If the biopsy is benign, recommend six-month follow-up of the right breast asymmetry. If the biopsy results in a need for surgery, recommend stereotactic biopsy of the right breast asymmetry.  I have discussed the findings and recommendations with the patient. If applicable, a reminder letter will be sent to the patient regarding the next appointment.  BI-RADS CATEGORY 4: Suspicious.   Electronically Signed By: Gerome Sam III M.D. On: 10/11/2021 09:21    Result History  MM DIAG BREAST TOMO BILATERAL (Order #[id number]) on 10/11/2021 - Order Result History Report - Result Edited MyChart Results Release  MyChart Status: Active Results Release   Encounter-Level Documents on 10/11/2021:  Electronic signature on 10/11/2021 7:46 AM - E-signed Electronic signature on 10/11/2021 7:46 AM - E-signed Scan on 10/05/2021 3:22 PM by Default, Provider, MD   Order-Level Documents:  There are no order-level documents. Vitals  Height Weight BMI (Calculated)    Assessment/Recommendation  Assessment Side  Recommendation  Suspicious [4] Right Biopsy   Imaging  Imaging Information   MM DIAG BREAST TOMO BILATERAL: Patient Communication  Add Comments Seen   Resulted by:  Signed Date/Time Phone Pager  Judithe Modest, DAVID M 10/11/2021 9:21 AM [phone]   Reference Links     Original Order  Ordered On Ordered By  09/26/2021 10:36 AM Deliah Boston     MM DIAG BREAST TOMO BILATERAL [[id number]] (Abnormal) Resulted: 10/11/21 0921, Result status: Final result Ordering provider: Philip Aspen, DO 10/11/21 0747 Resulted by: Waldo Laine, MD  Performed: 10/11/21 0753 - 10/11/21 0849 Accession number: [id number]  Resulting lab: [facility]  Narrative:  CLINICAL DATA: The patient was called back for right breast  calcifications, a right breast asymmetry and a left breast  asymmetry.   EXAM:  DIGITAL DIAGNOSTIC BILATERAL MAMMOGRAM WITH TOMOSYNTHESIS;  ULTRASOUND LEFT BREAST LIMITED   TECHNIQUE:  Bilateral digital diagnostic mammography and breast tomosynthesis  was performed.; Targeted ultrasound examination of the left breast  was performed.   COMPARISON: Previous exam(s).   ACR Breast Density Category b: There are scattered areas of  fibroglandular density.   FINDINGS:  Calcifications in the medial superior right breast are best  identified on cc imaging, spanning up to 5 mm, coarsened  heterogeneous.   The asymmetry on the right was more conspicuous on screening images  due to difference in technique. On today's imaging, the asymmetry  appear similar compared to multiple previous years.   The asymmetry in the medial left breast appears to represent a mass  only seen on the cc view.  On physical exam, no suspicious lumps are identified.   Targeted ultrasound is performed, showing focal fibrocystic changes  in the medial left breast accounting for the left breast mass.   IMPRESSION:  Indeterminate right breast calcifications. Probably  benign right  breast asymmetry. Fibrocystic changes on the left.   RECOMMENDATION:  Recommend stereotactic biopsy of the right breast calcifications. If  the biopsy is benign, recommend six-month follow-up of the right  breast asymmetry. If the biopsy results in a need for surgery,  recommend stereotactic biopsy of the right breast asymmetry.   I have discussed the findings and recommendations with the patient.  If applicable, a reminder letter will be sent to the patient  regarding the next appointment.   BI-RADS CATEGORY 4: Suspicious   Diagnosis Breast, right, needle core biopsy, UIQ anterior - FOCAL ATYPICAL DUCTAL HYPERPLASIA ASSOCIATED WITH FIBROCYSTIC CHANGES WITH CALCIFICATIONS. - SEE NOTE. Diagnosis Note Dr. Alric Seton reviewed this case and agrees. Assessment and Plan:   Diagnoses and all orders for this visit:  Atypical ductal hyperplasia of right breast    Recommend right breast seed lumpectomy. Risks and benefits of procedure and rationale discussed. Potential upgrade risk to 35% discussed as well. Complications reviewed as well as alternative options. The procedure has been discussed with the patient. Alternatives to surgery have been discussed with the patient. Risks of surgery include bleeding, Infection, Seroma formation, death, and the need for further surgery. The patient understands and wishes to proceed.   No follow-ups on file.  Kennieth Francois, MD

## 2021-11-22 LAB — GLUCOSE, CAPILLARY: Glucose-Capillary: 121 mg/dL — ABNORMAL HIGH (ref 70–99)

## 2021-11-23 LAB — SURGICAL PATHOLOGY

## 2021-11-25 ENCOUNTER — Encounter (HOSPITAL_BASED_OUTPATIENT_CLINIC_OR_DEPARTMENT_OTHER): Payer: Self-pay | Admitting: Surgery

## 2021-11-26 ENCOUNTER — Encounter: Payer: Self-pay | Admitting: Surgery

## 2021-11-27 ENCOUNTER — Telehealth: Payer: Self-pay | Admitting: Family Medicine

## 2021-11-27 ENCOUNTER — Encounter (HOSPITAL_COMMUNITY): Payer: Self-pay

## 2021-11-27 NOTE — Telephone Encounter (Signed)
Patient called to follow up on issue with L ear; stated she finished all of her antibiotics and is still having an issue with the same ear.   Requesting either a refill or a different script.  Pharmacy confirmed as   [id number] - [place], [place] - [address]  [address], [place] [address]  Phone:  [phone]  Fax:  [phone]  DEA #:  [id number]  Please advise patient at [phone].

## 2021-12-04 NOTE — Telephone Encounter (Signed)
Patient called to follow up on request for either a refill of the same antibiotic or a different one; still having issue with L ear and OTC meds not helping.   Please advise at [phone].

## 2021-12-06 ENCOUNTER — Telehealth: Payer: Self-pay

## 2021-12-06 NOTE — Telephone Encounter (Signed)
We are having a hard time getting her Ozempic covered by her insurance. Is there another prescription she can try? Thank you.

## 2021-12-07 NOTE — Telephone Encounter (Signed)
Tried to call pt to make an appt for today w/pickard at 4.  no answer-LVM for return call

## 2021-12-20 NOTE — Telephone Encounter (Signed)
Pt returned called 10/20, stated that she was out of town at the moment. Will call when back in town if need anything.

## 2022-01-30 ENCOUNTER — Telehealth: Payer: Self-pay

## 2022-01-30 NOTE — Telephone Encounter (Signed)
Received information from pt's insurance plan and they have denied the Ozempic rx. Pt advised via my chart message. Thank you.

## 2022-02-04 ENCOUNTER — Ambulatory Visit (INDEPENDENT_AMBULATORY_CARE_PROVIDER_SITE_OTHER): Payer: BC Managed Care – PPO | Admitting: Family Medicine

## 2022-02-04 ENCOUNTER — Encounter: Payer: Self-pay | Admitting: Family Medicine

## 2022-02-04 VITALS — BP 128/82 | HR 73 | Ht 66.0 in | Wt 227.6 lb

## 2022-02-04 DIAGNOSIS — E1149 Type 2 diabetes mellitus with other diabetic neurological complication: Secondary | ICD-10-CM | POA: Diagnosis not present

## 2022-02-04 NOTE — Progress Notes (Signed)
Subjective:    Patient ID: Alexandra Christensen, female    DOB: [birth year], 51 y.o.   MRN: [id number]  HPI Patient has a history of type 2 diabetes.  She has been unable to take metformin due to neuropathy.  After stopping the neuropathy and starting B12, the patient's neuropathy in her feet have improved however every time she takes the metformin the neuropathy returns and she gets diarrhea.  She was on Ozempic but her insurance has refused to cover the GLP-1 agonist for some reason.  She reports polyuria and polydipsia.  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,  pt had TEE done 06-16-2013 normal;  (06-30-2020  no issue since and told unknown cause)   Type 2 diabetes mellitus ([place])    followed by pcp   (06-29-2020 pt stated does not check blood sugar at home)   Past Surgical History:  Procedure Laterality Date   ANAL FISTULOTOMY N/A 11/03/2019   Procedure: PARTIAL ANAL FISTULOTOMY;  Surgeon: Ileana Roup, MD;  Location: [facility];  Service: General;  Laterality: N/A;   BREAST LUMPECTOMY WITH RADIOACTIVE SEED LOCALIZATION Right 11/21/2021   Procedure: RIGHT BREAST LUMPECTOMY WITH RADIOACTIVE SEED LOCALIZATION;  Surgeon: Erroll Luna, MD;  Location: [facility];  Service: General;  Laterality: Right;   COLONOSCOPY W/ POLYPECTOMY  10/05/2020   two small polyps in the tranverse colon   INCISION AND DRAINAGE ABSCESS N/A 07/05/2020   Procedure: [place];   Surgeon: Ileana Roup, MD;  Location: [facility];  Service: General;  Laterality: N/A;   INCISION AND DRAINAGE ABSCESS N/A 04/23/2021   Procedure: INCISION AND DRAINAGE OF PERIRECTAL ABSCESS;  Surgeon: Ileana Roup, MD;  Location: [facility];  Service: General;  Laterality: N/A;   INCISION AND DRAINAGE ABSCESS ANAL     PLACEMENT OF SETON N/A 04/23/2021   Procedure: PLACEMENT OF SETONS TIMES TWO;  Surgeon: Ileana Roup, MD;  Location: [facility];  Service: General;  Laterality: N/A;   RECTAL EXAM UNDER ANESTHESIA N/A 11/03/2019   Procedure: RECTAL EXAM UNDER ANESTHESIA;  Surgeon: Ileana Roup, MD;  [facility];  Service: General;  Laterality: N/A;   RECTAL EXAM UNDER ANESTHESIA N/A 04/23/2021   Procedure: ANORECTAL EXAM UNDER ANESTHESIA;  Surgeon: Ileana Roup, MD;  Location: [facility];  Service: General;  Laterality: N/A;   TEE WITHOUT CARDIOVERSION N/A 06/16/2013   Procedure: TRANSESOPHAGEAL ECHOCARDIOGRAM (TEE);  Surgeon: Pixie Casino, MD;  Location: [facility];  Service: Cardiovascular;  Laterality: N/A;   WISDOM TOOTH EXTRACTION     Current Outpatient Medications on File Prior to Visit  Medication Sig Dispense Refill   ALPRAZolam (XANAX) 1 MG tablet Take 1 tablet (1 mg total) by mouth at bedtime as needed. for sleep 60 tablet  1   aluminum chloride (DRYSOL) 20 % external solution Apply topically at bedtime. 35 mL 0   atorvastatin (LIPITOR) 20 MG tablet Take 1 tablet (20 mg total) by mouth daily. 90 tablet 3   Blood Glucose Monitoring Suppl (BLOOD GLUCOSE SYSTEM PAK) KIT Dispense based on patient and insurance preference. Use to monitor FSBS 1x daily. Dx: E11.9 1 each 1   gabapentin (NEURONTIN) 300 MG capsule TAKE 1 CAPSULE BY MOUTH IN THE MORNING AND 2 AT BEDTIME 270 capsule 3   glucose blood (ONE TOUCH ULTRA TEST) test strip USE TO CHECK FASTING BLOOD GLUCOSE ONCE DAILY 50 each 3    ibuprofen (ADVIL) 800 MG tablet Take 1 tablet (800 mg total) by mouth every 8 (eight) hours as needed. 90 tablet 0   ibuprofen (ADVIL) 800 MG tablet Take 1 tablet (800 mg total) by mouth every 8 (eight) hours as needed. 30 tablet 0   Lancet Devices MISC Dispense based on patient and insurance preference. Use to monitor FSBS 1x daily. Dx: E11.9 1 each 1   Lancets MISC Dispense based on patient and insurance preference. Use to monitor FSBS 1x daily. Dx: E11.9 100 each 1   losartan (COZAAR) 25 MG tablet Take 1 tablet (25 mg total) by mouth daily. 90 tablet 2   oxyCODONE (OXY IR/ROXICODONE) 5 MG immediate release tablet Take 1 tablet (5 mg total) by mouth every 6 (six) hours as needed for severe pain. 15 tablet 0   vitamin B-12 (CYANOCOBALAMIN) 100 MCG tablet Take 100 mcg by mouth daily. Takes a couple of days a week, unsure of dosage.     No current facility-administered medications on file prior to visit.   Allergies  Allergen Reactions   Adhesive [Tape] Rash    Social History   Socioeconomic History   Marital status: Single    Spouse name: Not on file   Number of children: Not on file   Years of education: Not on file   Highest education level: Not on file  Occupational History   Not on file  Tobacco Use   Smoking status: Every Day    Packs/day: 0.50    Years: 30.00    Total pack years: 15.00    Types: Cigarettes   Smokeless tobacco: Never   Tobacco comments:    06-30-2020  pt stated 3ppwk, 04/18/21 pt stated about 6 cigarettes per day  Vaping Use   Vaping Use: Never used  Substance and Sexual Activity   Alcohol use: Not Currently    Comment: due to GI issues   Drug use: No   Sexual activity: Yes    Birth control/protection: None  Other Topics Concern   Not on file  Social History Narrative   Not on file   Social Determinants of Health   Financial Resource Strain: Not on file  Food Insecurity: Not on file  Transportation Needs: Not on file  Physical Activity: Not on  file  Stress: Not on file  Social Connections: Not on file  Intimate Partner Violence: Not on file   Family History  Problem Relation Age of Onset   Diabetes Mother    Heart disease Mother    Kidney disease Father    Heart disease Father    Stroke Sister 22   Cancer Sister        AML      Review of Systems  All other systems reviewed and are negative.      Objective:   Physical Exam Vitals reviewed.  Cardiovascular:     Rate and Rhythm: Normal rate and regular rhythm.     Heart sounds: Normal heart sounds.  Pulmonary:     Effort: Pulmonary effort is normal. No respiratory distress.     Breath sounds: Normal breath sounds. No stridor. No wheezing or rales.  Abdominal:     General: Bowel sounds are normal. There is no distension.     Palpations: Abdomen is soft. There is no mass.     Tenderness: There is no abdominal tenderness. There is no guarding or rebound.           Assessment & Plan:  Type 2 diabetes mellitus with neurological complications (HCC) - Plan: Hemoglobin A1c, Lipid panel, COMPLETE METABOLIC PANEL WITH GFR, Protein / Creatinine Ratio, Urine Check hemoglobin A1c.  Goal A1c is less than 6.5.  If elevated above 6.5 and the fact she cannot tolerate metformin, I hope that I can get Ozempic approved as this would be medically necessary.  I will also check a urine protein to creatinine ratio to determine if there is any signs of diabetic nephropathy.  Check a fasting lipid panel.  Goal LDL cholesterol is less than 100.

## 2022-02-05 ENCOUNTER — Other Ambulatory Visit: Payer: Self-pay

## 2022-02-05 DIAGNOSIS — E1165 Type 2 diabetes mellitus with hyperglycemia: Secondary | ICD-10-CM

## 2022-02-05 LAB — COMPLETE METABOLIC PANEL WITH GFR
AG Ratio: 1.5 (calc) (ref 1.0–2.5)
ALT: 31 U/L — ABNORMAL HIGH (ref 6–29)
AST: 20 U/L (ref 10–35)
Albumin: 4 g/dL (ref 3.6–5.1)
Alkaline phosphatase (APISO): 110 U/L (ref 37–153)
BUN: 12 mg/dL (ref 7–25)
CO2: 27 mmol/L (ref 20–32)
Calcium: 9.4 mg/dL (ref 8.6–10.4)
Chloride: 105 mmol/L (ref 98–110)
Creat: 0.81 mg/dL (ref 0.50–1.03)
Globulin: 2.6 g/dL (calc) (ref 1.9–3.7)
Glucose, Bld: 119 mg/dL — ABNORMAL HIGH (ref 65–99)
Potassium: 4.3 mmol/L (ref 3.5–5.3)
Sodium: 142 mmol/L (ref 135–146)
Total Bilirubin: 0.3 mg/dL (ref 0.2–1.2)
Total Protein: 6.6 g/dL (ref 6.1–8.1)
eGFR: 87 mL/min/{1.73_m2} (ref >=60)

## 2022-02-05 LAB — PROTEIN / CREATININE RATIO, URINE
Creatinine, Urine: 150 mg/dL (ref 20–275)
Protein/Creat Ratio: 107 mg/g creat (ref 24–184)
Protein/Creatinine Ratio: 0.107 mg/mg creat (ref 0.024–0.184)
Total Protein, Urine: 16 mg/dL (ref 5–24)

## 2022-02-05 LAB — HEMOGLOBIN A1C
Hgb A1c MFr Bld: 7.2 % of total Hgb — ABNORMAL HIGH (ref <5.7)
Mean Plasma Glucose: 160 mg/dL
eAG (mmol/L): 8.9 mmol/L

## 2022-02-05 LAB — LIPID PANEL
Cholesterol: 182 mg/dL (ref <200)
HDL: 51 mg/dL (ref >=50)
LDL Cholesterol (Calc): 100 mg/dL (calc) — ABNORMAL HIGH
Non-HDL Cholesterol (Calc): 131 mg/dL (calc) — ABNORMAL HIGH (ref <130)
Total CHOL/HDL Ratio: 3.6 (calc) (ref <5.0)
Triglycerides: 190 mg/dL — ABNORMAL HIGH (ref <150)

## 2022-02-05 MED ORDER — SEMAGLUTIDE (1 MG/DOSE) 4 MG/3ML ~~LOC~~ SOPN: 1.0000 mg | PEN_INJECTOR | SUBCUTANEOUS | 3 refills | Status: DC

## 2022-02-05 NOTE — Telephone Encounter (Signed)
error 

## 2022-02-19 ENCOUNTER — Other Ambulatory Visit: Payer: Self-pay

## 2022-02-19 DIAGNOSIS — E1165 Type 2 diabetes mellitus with hyperglycemia: Secondary | ICD-10-CM

## 2022-02-19 MED ORDER — SEMAGLUTIDE (1 MG/DOSE) 4 MG/3ML ~~LOC~~ SOPN: 1.0000 mg | PEN_INJECTOR | SUBCUTANEOUS | 3 refills | Status: DC

## 2022-03-29 ENCOUNTER — Other Ambulatory Visit: Payer: Self-pay | Admitting: Family Medicine

## 2022-04-09 ENCOUNTER — Ambulatory Visit (INDEPENDENT_AMBULATORY_CARE_PROVIDER_SITE_OTHER): Payer: BC Managed Care – PPO

## 2022-04-09 DIAGNOSIS — Z23 Encounter for immunization: Secondary | ICD-10-CM

## 2022-04-09 NOTE — Progress Notes (Signed)
Patient in for #3 in Hep B series.  Tolerated with no complications

## 2022-06-04 ENCOUNTER — Encounter: Payer: Self-pay | Admitting: Family Medicine

## 2022-06-04 ENCOUNTER — Ambulatory Visit: Payer: BC Managed Care – PPO | Admitting: Family Medicine

## 2022-06-04 VITALS — BP 118/62 | HR 115 | Temp 98.5°F | Ht 66.0 in | Wt 205.0 lb

## 2022-06-04 DIAGNOSIS — R1031 Right lower quadrant pain: Secondary | ICD-10-CM

## 2022-06-04 DIAGNOSIS — M549 Dorsalgia, unspecified: Secondary | ICD-10-CM | POA: Diagnosis not present

## 2022-06-04 DIAGNOSIS — R310 Gross hematuria: Secondary | ICD-10-CM | POA: Diagnosis not present

## 2022-06-04 LAB — URINALYSIS, COMPLETE
Glucose, UA: NEGATIVE
Nitrite: NEGATIVE
Specific Gravity, Urine: 1.026 (ref 1.001–1.035)
pH: 5.5 (ref 5.0–8.0)

## 2022-06-04 MED ORDER — TAMSULOSIN HCL 0.4 MG PO CAPS: 0.4000 mg | ORAL_CAPSULE | Freq: Every day | ORAL | 0 refills | Status: DC

## 2022-06-04 MED ORDER — OXYCODONE-ACETAMINOPHEN 5-325 MG PO TABS: 1.0000 | ORAL_TABLET | ORAL | 0 refills | Status: AC | PRN

## 2022-06-04 NOTE — Progress Notes (Signed)
Subjective:    Patient ID: Alexandra Christensen, female    DOB: 1971-02-10, 52 y.o.   MRN: [id number]  Back Pain  Symptoms began 6 days ago.  The symptoms started with severe pain in her right flank that would take her breath away.  The pain radiates around to her right lower abdomen.  Sharp and severe at times.  She has right-sided CVA tenderness.  She has also noticed her urine getting darker today, there is +2 blood, +2 bilirubin urinalysis, +3 protein, and trace leukocyte esterase.  The quantity was insufficient to do microscopy or urine culture.  She denies any dysuria or urgency or frequency.  She denies any fevers or chills but she does feel nauseated.  It also hurts to take a deep breath in.  Pain seems to come and go at will.  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,  pt had TEE done 06-16-2013 normal;  (06-30-2020  no issue since and told unknown cause)   Type 2 diabetes mellitus    followed by pcp   (06-29-2020 pt stated does not check blood sugar at home)   Past Surgical History:  Procedure Laterality Date   ANAL FISTULOTOMY N/A 11/03/2019   Procedure: PARTIAL ANAL FISTULOTOMY;  Surgeon: Andria Meuse, MD;  [facility];  Service: General;  Laterality: N/A;   BREAST LUMPECTOMY WITH RADIOACTIVE SEED LOCALIZATION Right 11/21/2021   Procedure: RIGHT BREAST LUMPECTOMY WITH RADIOACTIVE SEED LOCALIZATION;  Surgeon: Harriette Bouillon, MD;  Location: [facility];  Service: General;  Laterality:  Right;   COLONOSCOPY W/ POLYPECTOMY  10/05/2020   two small polyps in the tranverse colon   INCISION AND DRAINAGE ABSCESS N/A 07/05/2020   Procedure: INCISION AND DRAINAGE PERIRECTAL ABSCESS & PLACEMENT OF SETON;  Surgeon: Andria Meuse, MD;  Location: [facility];  Service: General;  Laterality: N/A;   INCISION AND DRAINAGE ABSCESS N/A 04/23/2021   Procedure: INCISION AND DRAINAGE OF PERIRECTAL ABSCESS;  Surgeon: Andria Meuse, MD;  Location: [facility];  Service: General;  Laterality: N/A;   INCISION AND DRAINAGE ABSCESS ANAL     PLACEMENT OF SETON N/A 04/23/2021   Procedure: PLACEMENT OF SETONS TIMES TWO;  Surgeon: Andria Meuse, MD;  Location: [facility];  Service: General;  Laterality: N/A;   RECTAL EXAM UNDER ANESTHESIA N/A 11/03/2019   Procedure: RECTAL EXAM UNDER ANESTHESIA;  Surgeon: Andria Meuse, MD;  [facility];  Service: General;  Laterality: N/A;   RECTAL EXAM UNDER ANESTHESIA N/A 04/23/2021   Procedure: ANORECTAL EXAM UNDER ANESTHESIA;  Surgeon: Andria Meuse, MD;  Location: [facility];  Service: General;  Laterality: N/A;   TEE WITHOUT CARDIOVERSION N/A 06/16/2013   Procedure: TRANSESOPHAGEAL ECHOCARDIOGRAM (TEE);  Surgeon: Chrystie Nose, MD;  Location: [facility];  Service: Cardiovascular;  Laterality: N/A;  WISDOM TOOTH EXTRACTION     Current Outpatient Medications on File Prior to Visit  Medication Sig Dispense Refill   ALPRAZolam (XANAX) 1 MG tablet TAKE 1 TABLET BY MOUTH AT BEDTIME AS NEEDED FOR SLEEP 60 tablet 0   aluminum chloride (DRYSOL) 20 % external solution Apply topically at bedtime. 35 mL 0   atorvastatin (LIPITOR) 20 MG tablet Take 1 tablet (20 mg total) by mouth daily. 90 tablet 3   Blood Glucose Monitoring Suppl (BLOOD GLUCOSE SYSTEM PAK) KIT Dispense based on patient and insurance preference. Use to monitor FSBS 1x daily. Dx: E11.9 1 each 1   gabapentin  (NEURONTIN) 300 MG capsule TAKE 1 CAPSULE BY MOUTH IN THE MORNING AND 2 AT BEDTIME 270 capsule 3   glucose blood (ONE TOUCH ULTRA TEST) test strip USE TO CHECK FASTING BLOOD GLUCOSE ONCE DAILY 50 each 3   ibuprofen (ADVIL) 800 MG tablet Take 1 tablet (800 mg total) by mouth every 8 (eight) hours as needed. 90 tablet 0   ibuprofen (ADVIL) 800 MG tablet Take 1 tablet (800 mg total) by mouth every 8 (eight) hours as needed. 30 tablet 0   ibuprofen (ADVIL) 800 MG tablet TAKE 1 TABLET BY MOUTH EVERY 8 HOURS AS NEEDED 90 tablet 0   Lancet Devices MISC Dispense based on patient and insurance preference. Use to monitor FSBS 1x daily. Dx: E11.9 1 each 1   Lancets MISC Dispense based on patient and insurance preference. Use to monitor FSBS 1x daily. Dx: E11.9 100 each 1   losartan (COZAAR) 25 MG tablet Take 1 tablet by mouth once daily 90 tablet 0   oxyCODONE (OXY IR/ROXICODONE) 5 MG immediate release tablet Take 1 tablet (5 mg total) by mouth every 6 (six) hours as needed for severe pain. 15 tablet 0   Semaglutide, 1 MG/DOSE, 4 MG/3ML SOPN Inject 1 mg as directed once a week. 3 mL 3   vitamin B-12 (CYANOCOBALAMIN) 100 MCG tablet Take 100 mcg by mouth daily. Takes a couple of days a week, unsure of dosage. (Patient not taking: Reported on 06/04/2022)     No current facility-administered medications on file prior to visit.   Allergies  Allergen Reactions   Adhesive [Tape] Rash    Social History   Socioeconomic History   Marital status: Single    Spouse name: Not on file   Number of children: Not on file   Years of education: Not on file   Highest education level: Not on file  Occupational History   Not on file  Tobacco Use   Smoking status: Every Day    Packs/day: 0.50    Years: 30.00    Additional pack years: 0.00    Total pack years: 15.00    Types: Cigarettes   Smokeless tobacco: Never   Tobacco comments:    06-30-2020  pt stated 3ppwk, 04/18/21 pt stated about 6 cigarettes per day  Vaping  Use   Vaping Use: Never used  Substance and Sexual Activity   Alcohol use: Not Currently    Comment: due to GI issues   Drug use: No   Sexual activity: Yes    Birth control/protection: None  Other Topics Concern   Not on file  Social History Narrative   Not on file   Social Determinants of Health   Financial Resource Strain: Not on file  Food Insecurity: Not on file  Transportation Needs: Not on file  Physical Activity: Not on file  Stress: Not on file  Social Connections: Not on file  Intimate Partner Violence: Not on file   Family History  Problem Relation Age of Onset   Diabetes Mother    Heart disease Mother    Kidney disease Father    Heart disease Father    Stroke Sister 43   Cancer Sister        AML      Review of Systems  Musculoskeletal:  Positive for back pain.  All other systems reviewed and are negative.      Objective:   Physical Exam Vitals reviewed.  Constitutional:      Appearance: Normal appearance. She is obese. She is not ill-appearing or toxic-appearing.  Cardiovascular:     Rate and Rhythm: Normal rate and regular rhythm.     Heart sounds: Normal heart sounds.  Pulmonary:     Effort: Pulmonary effort is normal. No respiratory distress.     Breath sounds: Normal breath sounds. No stridor. No wheezing or rales.  Abdominal:     General: Bowel sounds are normal. There is no distension.     Palpations: Abdomen is soft. There is no mass.     Tenderness: There is no abdominal tenderness. There is right CVA tenderness. There is no guarding or rebound.    Musculoskeletal:       Back:  Neurological:     Mental Status: She is alert.           Assessment & Plan:  Other acute back pain - Plan: Urinalysis, Routine w reflex microscopic, POCT URINALYSIS DIP (CLINITEK), Urinalysis, Complete  Right lower quadrant abdominal pain - Plan: CT RENAL STONE STUDY  Gross hematuria - Plan: CT RENAL STONE STUDY I am concerned that the patient may  have a kidney stone.  The pain has been present for 6 days and now she is having CVA tenderness which makes me concerned about possible hydronephrosis.  I will obtain a stat CT scan of the abdomen and pelvis to evaluate 1 for the presence of a kidney stone and to determine if it would pass spontaneously or requires instrumentation.  Begin Flomax 0.4 mg p.o. daily, push fluids, use oxycodone 5/325 1-2 every 4-6 hours as needed for severe pain.  Go to the emergency room immediately if the pain becomes unbearable or she develops fevers

## 2022-06-05 ENCOUNTER — Ambulatory Visit
Admission: RE | Admit: 2022-06-05 | Discharge: 2022-06-05 | Disposition: A | Discharge status: Home / Self Care | Payer: BC Managed Care – PPO | Source: Ambulatory Visit | Attending: Family Medicine | Admitting: Family Medicine

## 2022-06-05 DIAGNOSIS — R1031 Right lower quadrant pain: Secondary | ICD-10-CM

## 2022-06-05 DIAGNOSIS — R310 Gross hematuria: Secondary | ICD-10-CM

## 2022-06-06 ENCOUNTER — Other Ambulatory Visit: Payer: Self-pay | Admitting: Family Medicine

## 2022-06-06 ENCOUNTER — Ambulatory Visit: Payer: BC Managed Care – PPO | Admitting: Family Medicine

## 2022-06-06 MED ORDER — AZITHROMYCIN 250 MG PO TABS: ORAL_TABLET | ORAL | 0 refills | Status: AC

## 2022-06-06 MED ORDER — AMOXICILLIN-POT CLAVULANATE 875-125 MG PO TABS: 1.0000 | ORAL_TABLET | Freq: Two times a day (BID) | ORAL | 0 refills | Status: AC

## 2022-06-11 ENCOUNTER — Ambulatory Visit: Payer: BC Managed Care – PPO | Admitting: Family Medicine

## 2022-06-11 ENCOUNTER — Encounter: Payer: Self-pay | Admitting: Family Medicine

## 2022-06-11 VITALS — BP 124/70 | HR 95 | Temp 98.5°F | Ht 66.0 in | Wt 213.0 lb

## 2022-06-11 DIAGNOSIS — J159 Unspecified bacterial pneumonia: Secondary | ICD-10-CM

## 2022-06-11 NOTE — Progress Notes (Signed)
Subjective:    Patient ID: Alexandra Christensen, female    DOB: 1970/05/21, 52 y.o.   MRN: [id number] I recently saw the patient for right flank pain with right CVA tenderness and hematuria.  As part of workup I obtained a renal CT scan to evaluate for nephrolithiasis.  There was a coincidental finding of a right lower lobe pneumonia which was treated with Augmentin and Z-Pak.  Patient states that she feels almost completely better.  She denies any pleurisy.  She denies any hemoptysis.  She denies any shortness of breath.  She denies any fever.  Today on exam, there is no CVA tenderness.  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,  pt had TEE done 06-16-2013 normal;  (06-30-2020  no issue since and told unknown cause)   Type 2 diabetes mellitus    followed by pcp   (06-29-2020 pt stated does not check blood sugar at home)   Past Surgical History:  Procedure Laterality Date   ANAL FISTULOTOMY N/A 11/03/2019   Procedure: PARTIAL ANAL FISTULOTOMY;  Surgeon: Andria Meuse, MD;  [facility];  Service: General;  Laterality: N/A;   BREAST LUMPECTOMY WITH RADIOACTIVE SEED LOCALIZATION Right 11/21/2021   Procedure: RIGHT BREAST LUMPECTOMY WITH RADIOACTIVE SEED LOCALIZATION;  Surgeon: Harriette Bouillon, MD;  Location: [facility];  Service: General;  Laterality: Right;   COLONOSCOPY W/ POLYPECTOMY  10/05/2020   two small polyps in the tranverse colon   INCISION AND DRAINAGE ABSCESS N/A 07/05/2020   Procedure: INCISION AND  DRAINAGE PERIRECTAL ABSCESS & PLACEMENT OF SETON;  Surgeon: Andria Meuse, MD;  Location: [facility];  Service: General;  Laterality: N/A;   INCISION AND DRAINAGE ABSCESS N/A 04/23/2021   Procedure: INCISION AND DRAINAGE OF PERIRECTAL ABSCESS;  Surgeon: Andria Meuse, MD;  Location: [facility];  Service: General;  Laterality: N/A;   INCISION AND DRAINAGE ABSCESS ANAL     PLACEMENT OF SETON N/A 04/23/2021   Procedure: PLACEMENT OF SETONS TIMES TWO;  Surgeon: Andria Meuse, MD;  Location: [facility];  Service: General;  Laterality: N/A;   RECTAL EXAM UNDER ANESTHESIA N/A 11/03/2019   Procedure: RECTAL EXAM UNDER ANESTHESIA;  Surgeon: Andria Meuse, MD;  [facility];  Service: General;  Laterality: N/A;   RECTAL EXAM UNDER ANESTHESIA N/A 04/23/2021   Procedure: ANORECTAL EXAM UNDER ANESTHESIA;  Surgeon: Andria Meuse, MD;  Location: [facility];  Service: General;  Laterality: N/A;   TEE WITHOUT CARDIOVERSION N/A 06/16/2013   Procedure: TRANSESOPHAGEAL ECHOCARDIOGRAM (TEE);  Surgeon: Chrystie Nose, MD;  Location: [facility];  Service: Cardiovascular;  Laterality: N/A;   WISDOM TOOTH EXTRACTION     Current Outpatient Medications on File Prior to Visit  Medication Sig Dispense Refill   ALPRAZolam (XANAX) 1 MG tablet TAKE 1 TABLET BY MOUTH AT  BEDTIME AS NEEDED FOR SLEEP 60 tablet 0   aluminum chloride (DRYSOL) 20 % external solution Apply topically at bedtime. 35 mL 0   amoxicillin-clavulanate (AUGMENTIN) 875-125 MG tablet Take 1 tablet by mouth 2 (two) times daily. 20 tablet 0   atorvastatin (LIPITOR) 20 MG tablet Take 1 tablet (20 mg total) by mouth daily. 90 tablet 3   azithromycin (ZITHROMAX) 250 MG tablet 2 tabs poqday1, 1 tab poqday 2-5 6 tablet 0   Blood Glucose Monitoring Suppl (BLOOD GLUCOSE SYSTEM PAK) KIT Dispense based on patient and insurance preference. Use to monitor FSBS 1x daily. Dx:  E11.9 1 each 1   gabapentin (NEURONTIN) 300 MG capsule TAKE 1 CAPSULE BY MOUTH IN THE MORNING AND 2 AT BEDTIME 270 capsule 3   glucose blood (ONE TOUCH ULTRA TEST) test strip USE TO CHECK FASTING BLOOD GLUCOSE ONCE DAILY 50 each 3   ibuprofen (ADVIL) 800 MG tablet Take 1 tablet (800 mg total) by mouth every 8 (eight) hours as needed. 90 tablet 0   ibuprofen (ADVIL) 800 MG tablet Take 1 tablet (800 mg total) by mouth every 8 (eight) hours as needed. 30 tablet 0   ibuprofen (ADVIL) 800 MG tablet TAKE 1 TABLET BY MOUTH EVERY 8 HOURS AS NEEDED 90 tablet 0   Lancet Devices MISC Dispense based on patient and insurance preference. Use to monitor FSBS 1x daily. Dx: E11.9 1 each 1   Lancets MISC Dispense based on patient and insurance preference. Use to monitor FSBS 1x daily. Dx: E11.9 100 each 1   losartan (COZAAR) 25 MG tablet Take 1 tablet by mouth once daily 90 tablet 0   oxyCODONE (OXY IR/ROXICODONE) 5 MG immediate release tablet Take 1 tablet (5 mg total) by mouth every 6 (six) hours as needed for severe pain. 15 tablet 0   oxyCODONE-acetaminophen (PERCOCET/ROXICET) 5-325 MG tablet Take 1-2 tablets by mouth every 4 (four) hours as needed for up to 7 days for severe pain. 30 tablet 0   Semaglutide, 1 MG/DOSE, 4 MG/3ML SOPN Inject 1 mg as directed once a week. 3 mL 3   tamsulosin (FLOMAX) 0.4 MG CAPS capsule Take 1 capsule (0.4 mg total) by mouth daily. 30 capsule 0   vitamin B-12 (CYANOCOBALAMIN) 100 MCG tablet Take 100 mcg by mouth daily. Takes a couple of days a week, unsure of dosage.     No current facility-administered medications on file prior to visit.   Allergies  Allergen Reactions   Adhesive [Tape] Rash    Social History   Socioeconomic History   Marital status: Single    Spouse name: Not on file   Number of children: Not on file   Years of education: Not on file   Highest education level: Not on file  Occupational History   Not on file  Tobacco Use   Smoking status: Every Day     Packs/day: 0.50    Years: 30.00    Additional pack years: 0.00    Total pack years: 15.00    Types: Cigarettes   Smokeless tobacco: Never   Tobacco comments:    06-30-2020  pt stated 3ppwk, 04/18/21 pt stated about 6 cigarettes per day  Vaping Use   Vaping Use: Never used  Substance and Sexual Activity   Alcohol use: Not Currently    Comment: due to GI issues   Drug use: No   Sexual activity: Yes    Birth control/protection: None  Other Topics Concern   Not on file  Social History Narrative   Not on file   Social Determinants of Health   Financial Resource Strain: Not on file  Food Insecurity: Not on file  Transportation Needs: Not on file  Physical Activity: Not on file  Stress: Not on file  Social Connections: Not on file  Intimate Partner Violence: Not on file   Family History  Problem Relation Age of Onset   Diabetes Mother    Heart disease Mother    Kidney disease Father    Heart disease Father    Stroke Sister 31   Cancer Sister        AML      Review of Systems  Musculoskeletal:  Positive for back pain.  All other systems reviewed and are negative.      Objective:   Physical Exam Vitals reviewed.  Constitutional:      Appearance: Normal appearance. She is obese. She is not ill-appearing or toxic-appearing.  Cardiovascular:     Rate and Rhythm: Normal rate and regular rhythm.     Heart sounds: Normal heart sounds.  Pulmonary:     Effort: Pulmonary effort is normal. No respiratory distress.     Breath sounds: Normal breath sounds. No stridor. No wheezing or rales.  Abdominal:     General: Bowel sounds are normal. There is no distension.     Palpations: Abdomen is soft. There is no mass.     Tenderness: There is no abdominal tenderness. There is no right CVA tenderness, guarding or rebound.  Neurological:     Mental Status: She is alert.           Assessment & Plan:  Community acquired bacterial pneumonia - Plan: DG Chest 2  View Patient had a very unusual presentation however she is clinically recovered having taken Augmentin and Z-Pak.  I recommended repeating a chest x-ray in 4 weeks to ensure resolution however clinically she has resolved.  I also want her repeat fasting lab work to monitor her diabetes.  She needs to come in at her earliest convenience fasting for CMP, lipid panel, A1c, and a urine protein creatinine ratio

## 2022-06-27 ENCOUNTER — Other Ambulatory Visit: Payer: Self-pay | Admitting: Family Medicine

## 2022-06-27 NOTE — Telephone Encounter (Signed)
Requested medication (s) are due for refill today - provider review   Requested medication (s) are on the active medication list -yes  Future visit scheduled -yes  Last refill: 06/04/22 #30  Notes to clinic: Rx for acute problem- sent for PCP review to continue   Requested Prescriptions  Pending Prescriptions Disp Refills   tamsulosin (FLOMAX) 0.4 MG CAPS capsule [Pharmacy Med Name: TAMSULOSIN HCL 0.4 MG CAPSULE] 90 capsule 1    Sig: TAKE 1 CAPSULE BY MOUTH EVERY DAY     Urology: Alpha-Adrenergic Blocker Failed - 06/27/2022  1:31 PM      Failed - PSA in normal range and within 360 days    No results found for: "LABPSA", "PSA", "PSA1", "ULTRAPSA"       Failed - Valid encounter within last 12 months    Recent Outpatient Visits           1 year ago Folliculitis   [facility] Valentino Nose, NP   1 year ago Essential hypertension   [facility] [place], Priscille Heidelberg, MD   2 years ago Essential hypertension   [facility] [place], Velna Hatchet, MD   2 years ago Routine general medical examination at a health care facility   [facility] [place], Velna Hatchet, MD   2 years ago Infected sebaceous cyst   [facility] Donita Brooks, MD              Passed - Last BP in normal range    BP Readings from Last 1 Encounters:  06/11/22 124/70            Requested Prescriptions  Pending Prescriptions Disp Refills   tamsulosin (FLOMAX) 0.4 MG CAPS capsule [Pharmacy Med Name: TAMSULOSIN HCL 0.4 MG CAPSULE] 90 capsule 1    Sig: TAKE 1 CAPSULE BY MOUTH EVERY DAY     Urology: Alpha-Adrenergic Blocker Failed - 06/27/2022  1:31 PM      Failed - PSA in normal range and within 360 days    No results found for: "LABPSA", "PSA", "PSA1", "ULTRAPSA"       Failed - Valid encounter within last 12 months    Recent Outpatient Visits           1 year ago Folliculitis   [facility] Valentino Nose, NP   1 year ago Essential hypertension   [facility] [place], Priscille Heidelberg, MD   2 years ago Essential hypertension   [facility] [place], Velna Hatchet, MD   2 years ago Routine general medical examination at a health care facility   [facility] [place], Velna Hatchet, MD   2 years ago Infected sebaceous cyst   [facility] Donita Brooks, MD              Passed - Last BP in normal range    BP Readings from Last 1 Encounters:  06/11/22 124/70

## 2022-07-02 ENCOUNTER — Other Ambulatory Visit: Payer: BC Managed Care – PPO

## 2022-07-03 ENCOUNTER — Other Ambulatory Visit: Payer: Self-pay | Admitting: Family Medicine

## 2022-07-03 MED ORDER — LOSARTAN POTASSIUM 25 MG PO TABS: 25.0000 mg | ORAL_TABLET | Freq: Every day | ORAL | 0 refills | Status: DC

## 2022-07-09 ENCOUNTER — Other Ambulatory Visit: Payer: BC Managed Care – PPO

## 2022-07-09 ENCOUNTER — Ambulatory Visit (HOSPITAL_COMMUNITY)
Admission: RE | Admit: 2022-07-09 | Discharge: 2022-07-09 | Disposition: A | Discharge status: Home / Self Care | Payer: BC Managed Care – PPO | Source: Ambulatory Visit | Attending: Family Medicine | Admitting: Family Medicine

## 2022-07-09 DIAGNOSIS — J159 Unspecified bacterial pneumonia: Secondary | ICD-10-CM

## 2022-07-09 DIAGNOSIS — K529 Noninfective gastroenteritis and colitis, unspecified: Secondary | ICD-10-CM

## 2022-07-09 DIAGNOSIS — E1149 Type 2 diabetes mellitus with other diabetic neurological complication: Secondary | ICD-10-CM

## 2022-07-09 DIAGNOSIS — E781 Pure hyperglyceridemia: Secondary | ICD-10-CM

## 2022-07-09 DIAGNOSIS — I10 Essential (primary) hypertension: Secondary | ICD-10-CM

## 2022-07-09 DIAGNOSIS — G629 Polyneuropathy, unspecified: Secondary | ICD-10-CM

## 2022-07-09 LAB — CBC WITH DIFFERENTIAL/PLATELET
Absolute Monocytes: 545 cells/uL (ref 200–950)
Basophils Absolute: 85 cells/uL (ref 0–200)
Eosinophils Relative: 6 %
Lymphs Abs: 3182 cells/uL (ref 850–3900)
RDW: 13.1 % (ref 11.0–15.0)

## 2022-07-10 LAB — CBC WITH DIFFERENTIAL/PLATELET
Basophils Relative: 0.7 %
Eosinophils Absolute: 726 cells/uL — ABNORMAL HIGH (ref 15–500)
HCT: 45.9 % — ABNORMAL HIGH (ref 35.0–45.0)
Hemoglobin: 15.4 g/dL (ref 11.7–15.5)
MCH: 30 pg (ref 27.0–33.0)
MCHC: 33.6 g/dL (ref 32.0–36.0)
MCV: 89.5 fL (ref 80.0–100.0)
MPV: 11.8 fL (ref 7.5–12.5)
Monocytes Relative: 4.5 %
Neutro Abs: 7563 cells/uL (ref 1500–7800)
Neutrophils Relative %: 62.5 %
Platelets: 324 10*3/uL (ref 140–400)
RBC: 5.13 10*6/uL — ABNORMAL HIGH (ref 3.80–5.10)
Total Lymphocyte: 26.3 %
WBC: 12.1 10*3/uL — ABNORMAL HIGH (ref 3.8–10.8)

## 2022-07-10 LAB — COMPLETE METABOLIC PANEL WITH GFR
AG Ratio: 1.3 (calc) (ref 1.0–2.5)
ALT: 7 U/L (ref 6–29)
AST: 10 U/L (ref 10–35)
Albumin: 3.9 g/dL (ref 3.6–5.1)
Alkaline phosphatase (APISO): 107 U/L (ref 37–153)
BUN: 12 mg/dL (ref 7–25)
CO2: 24 mmol/L (ref 20–32)
Calcium: 9.6 mg/dL (ref 8.6–10.4)
Chloride: 104 mmol/L (ref 98–110)
Creat: 0.96 mg/dL (ref 0.50–1.03)
Globulin: 3 g/dL (calc) (ref 1.9–3.7)
Glucose, Bld: 92 mg/dL (ref 65–99)
Potassium: 4.3 mmol/L (ref 3.5–5.3)
Sodium: 141 mmol/L (ref 135–146)
Total Bilirubin: 0.5 mg/dL (ref 0.2–1.2)
Total Protein: 6.9 g/dL (ref 6.1–8.1)
eGFR: 70 mL/min/{1.73_m2} (ref >=60)

## 2022-07-10 LAB — VITAMIN B12: Vitamin B-12: 486 pg/mL (ref 200–1100)

## 2022-07-10 LAB — LIPID PANEL
Cholesterol: 179 mg/dL (ref <200)
HDL: 40 mg/dL — ABNORMAL LOW (ref >=50)
LDL Cholesterol (Calc): 105 mg/dL (calc) — ABNORMAL HIGH
Non-HDL Cholesterol (Calc): 139 mg/dL (calc) — ABNORMAL HIGH (ref <130)
Total CHOL/HDL Ratio: 4.5 (calc) (ref <5.0)
Triglycerides: 216 mg/dL — ABNORMAL HIGH (ref <150)

## 2022-07-10 LAB — MICROALBUMIN / CREATININE URINE RATIO
Creatinine, Urine: 166 mg/dL (ref 20–275)
Microalb Creat Ratio: 15 mg/g creat (ref <30)
Microalb, Ur: 2.5 mg/dL

## 2022-07-10 LAB — HEMOGLOBIN A1C
Hgb A1c MFr Bld: 6.2 % of total Hgb — ABNORMAL HIGH (ref <5.7)
Mean Plasma Glucose: 131 mg/dL
eAG (mmol/L): 7.3 mmol/L

## 2022-07-12 ENCOUNTER — Telehealth: Payer: Self-pay

## 2022-07-12 ENCOUNTER — Other Ambulatory Visit: Payer: Self-pay

## 2022-07-12 ENCOUNTER — Other Ambulatory Visit: Payer: Self-pay | Admitting: Family Medicine

## 2022-07-12 DIAGNOSIS — R911 Solitary pulmonary nodule: Secondary | ICD-10-CM

## 2022-07-12 DIAGNOSIS — J159 Unspecified bacterial pneumonia: Secondary | ICD-10-CM

## 2022-07-12 DIAGNOSIS — E781 Pure hyperglyceridemia: Secondary | ICD-10-CM

## 2022-07-12 MED ORDER — ROSUVASTATIN CALCIUM 40 MG PO TABS
40.0000 mg | ORAL_TABLET | Freq: Every day | ORAL | 1 refills | Status: DC
Start: 2022-07-12 — End: 2023-01-28

## 2022-07-12 NOTE — Telephone Encounter (Signed)
Call report result on patient's CXR:  IMPRESSION: Persistent right lower lobe airspace opacification. Recommend CT chest with contrast in further evaluation, as a centrally obstructing lesion cannot be excluded. These results will be called to the ordering clinician or representative by the Radiologist Assistant, and communication documented in the PACS or [employer].

## 2022-07-12 NOTE — Telephone Encounter (Signed)
IMPRESSION: Persistent right lower lobe airspace opacification. Recommend CT chest with contrast in further evaluation, as a centrally obstructing lesion cannot be excluded. These results will be called to the ordering clinician or representative by the Radiologist Assistant, and communication documented in the PACS or [employer].

## 2022-07-26 ENCOUNTER — Ambulatory Visit
Admission: RE | Admit: 2022-07-26 | Discharge: 2022-07-26 | Disposition: A | Discharge status: Home / Self Care | Payer: BC Managed Care – PPO | Source: Ambulatory Visit | Attending: Family Medicine | Admitting: Family Medicine

## 2022-07-26 DIAGNOSIS — R911 Solitary pulmonary nodule: Secondary | ICD-10-CM

## 2022-07-26 MED ORDER — IOPAMIDOL (ISOVUE-300) INJECTION 61%
75.0000 mL | Freq: Once | INTRAVENOUS | Status: AC | PRN
  Administered 2022-07-26: 75 mL via INTRAVENOUS

## 2022-07-30 ENCOUNTER — Telehealth: Payer: Self-pay

## 2022-07-30 NOTE — Telephone Encounter (Signed)
Pt called in wanting to discuss results of CT scan. Please advise.  Cb#: [phone]

## 2022-09-09 ENCOUNTER — Other Ambulatory Visit: Payer: Self-pay | Admitting: Family Medicine

## 2022-09-09 DIAGNOSIS — E1165 Type 2 diabetes mellitus with hyperglycemia: Secondary | ICD-10-CM

## 2022-09-09 NOTE — Telephone Encounter (Signed)
Prescription Request  09/09/2022  LOV: 06/11/2022  What is the name of the medication or equipment?   Semaglutide (OZEMPIC), 1 MG/DOSE, 4 MG/3ML SOPN   Have you contacted your pharmacy to request a refill? Yes   Which pharmacy would you like this sent to?  [facility] [address] [place] - [address] [place] [address] Phone: [phone] Fax: [phone]    Patient notified that their request is being sent to the clinical staff for review and that they should receive a response within 2 business days.   Please advise pharmacist.

## 2022-09-10 MED ORDER — SEMAGLUTIDE (1 MG/DOSE) 4 MG/3ML ~~LOC~~ SOPN
1.0000 mg | PEN_INJECTOR | SUBCUTANEOUS | 2 refills | Status: DC
Start: 2022-09-10 — End: 2023-01-28

## 2022-09-10 NOTE — Telephone Encounter (Signed)
Requested Prescriptions  Pending Prescriptions Disp Refills   Semaglutide, 1 MG/DOSE, 4 MG/3ML SOPN 3 mL 2    Sig: Inject 1 mg as directed once a week.     Endocrinology:  Diabetes - GLP-1 Receptor Agonists - semaglutide Failed - 09/09/2022  9:23 AM      Failed - HBA1C in normal range and within 180 days    Hgb A1c MFr Bld  Date Value Ref Range Status  07/09/2022 6.2 (H) <5.7 % of total Hgb Final    Comment:    For someone without known diabetes, a hemoglobin  A1c value between 5.7% and 6.4% is consistent with prediabetes and should be confirmed with a  follow-up test. . For someone with known diabetes, a value <7% indicates that their diabetes is well controlled. A1c targets should be individualized based on duration of diabetes, age, comorbid conditions, and other considerations. . This assay result is consistent with an increased risk of diabetes. . Currently, no consensus exists regarding use of hemoglobin A1c for diagnosis of diabetes for children. .          Failed - Valid encounter within last 6 months    Recent Outpatient Visits           1 year ago Folliculitis   [facility] Valentino Nose, NP   2 years ago Essential hypertension   [facility] [place], Priscille Heidelberg, MD   2 years ago Essential hypertension   [facility] [place], Velna Hatchet, MD   2 years ago Routine general medical examination at a health care facility   [facility] [place], Velna Hatchet, MD   2 years ago Infected sebaceous cyst   [facility] Donita Brooks, MD              Passed - Cr in normal range and within 360 days    Creat  Date Value Ref Range Status  07/09/2022 0.96 0.50 - 1.03 mg/dL Final   Creatinine, Urine  Date Value Ref Range Status  07/09/2022 166 20 - 275 mg/dL Final

## 2022-10-05 ENCOUNTER — Other Ambulatory Visit: Payer: Self-pay | Admitting: Family Medicine

## 2022-10-07 NOTE — Telephone Encounter (Signed)
Requested Prescriptions  Pending Prescriptions Disp Refills   losartan (COZAAR) 25 MG tablet [Pharmacy Med Name: Losartan Potassium 25 MG Oral Tablet] 90 tablet 0    Sig: Take 1 tablet by mouth once daily     Cardiovascular:  Angiotensin Receptor Blockers Failed - 10/05/2022 11:52 AM      Failed - Valid encounter within last 6 months    Recent Outpatient Visits           1 year ago Folliculitis   [facility] Valentino Nose, NP   2 years ago Essential hypertension   [facility] Donita Brooks, MD   2 years ago Essential hypertension   [facility] [place], Velna Hatchet, MD   2 years ago Routine general medical examination at a health care facility   [facility] [place], Velna Hatchet, MD   3 years ago Infected sebaceous cyst   [facility] [place] Nones, Priscille Heidelberg, MD              Passed - Cr in normal range and within 180 days    Creat  Date Value Ref Range Status  07/09/2022 0.96 0.50 - 1.03 mg/dL Final   Creatinine, Urine  Date Value Ref Range Status  07/09/2022 166 20 - 275 mg/dL Final         Passed - K in normal range and within 180 days    Potassium  Date Value Ref Range Status  07/09/2022 4.3 3.5 - 5.3 mmol/L Final         Passed - Patient is not pregnant      Passed - Last BP in normal range    BP Readings from Last 1 Encounters:  06/11/22 124/70

## 2022-12-10 ENCOUNTER — Other Ambulatory Visit: Payer: Self-pay | Admitting: Family Medicine

## 2022-12-12 NOTE — Telephone Encounter (Signed)
Due to a system glitch the last office visit for this practice is not detected correctly.    LOV 06/11/2022   Labs in date.  Requested Prescriptions  Pending Prescriptions Disp Refills   gabapentin (NEURONTIN) 300 MG capsule [Pharmacy Med Name: Gabapentin 300 MG Oral Capsule] 270 capsule 0    Sig: TAKE 1 CAPSULE BY MOUTH IN THE MORNING AND 2 CAPSULES AT BEDTIME     Neurology: Anticonvulsants - gabapentin Failed - 12/10/2022  3:14 PM      Failed - Valid encounter within last 12 months    Recent Outpatient Visits           1 year ago Folliculitis   [facility] Valentino Nose, NP   2 years ago Essential hypertension   [facility] [place], Priscille Heidelberg, MD   2 years ago Essential hypertension   [facility] [place], Velna Hatchet, MD   3 years ago Routine general medical examination at a health care facility   [facility] Medicine [place], Velna Hatchet, MD   3 years ago Infected sebaceous cyst   [facility] Donita Brooks, MD              Passed - Cr in normal range and within 360 days    Creat  Date Value Ref Range Status  07/09/2022 0.96 0.50 - 1.03 mg/dL Final   Creatinine, Urine  Date Value Ref Range Status  07/09/2022 166 20 - 275 mg/dL Final         Passed - Completed PHQ-2 or PHQ-9 in the last 360 days

## 2022-12-17 ENCOUNTER — Other Ambulatory Visit: Payer: Self-pay | Admitting: Family Medicine

## 2022-12-17 DIAGNOSIS — E1165 Type 2 diabetes mellitus with hyperglycemia: Secondary | ICD-10-CM

## 2022-12-18 NOTE — Telephone Encounter (Signed)
Requested medication (s) are due for refill today: yes   Requested medication (s) are on the active medication list: yes   Last refill:  09/10/22 #3 ml 2 refills   Future visit scheduled: no  Notes to clinic:  last OV 06/11/22 for nondiabetic OV. How much to dispense?     Requested Prescriptions  Pending Prescriptions Disp Refills   OZEMPIC, 1 MG/DOSE, 4 MG/3ML SOPN [Pharmacy Med Name: OZEMPIC 4 MG/3 ML (1 MG/DOSE)]  2    Sig: INJECT 1 MG ONCE A WEEK AS DIRECTED     Endocrinology:  Diabetes - GLP-1 Receptor Agonists - semaglutide Failed - 12/17/2022 11:41 AM      Failed - HBA1C in normal range and within 180 days    Hgb A1c MFr Bld  Date Value Ref Range Status  07/09/2022 6.2 (H) <5.7 % of total Hgb Final    Comment:    For someone without known diabetes, a hemoglobin  A1c value between 5.7% and 6.4% is consistent with prediabetes and should be confirmed with a  follow-up test. . For someone with known diabetes, a value <7% indicates that their diabetes is well controlled. A1c targets should be individualized based on duration of diabetes, age, comorbid conditions, and other considerations. . This assay result is consistent with an increased risk of diabetes. . Currently, no consensus exists regarding use of hemoglobin A1c for diagnosis of diabetes for children. .          Failed - Valid encounter within last 6 months    Recent Outpatient Visits           1 year ago Folliculitis   [facility] Valentino Nose, NP   2 years ago Essential hypertension   [facility] [place], Priscille Heidelberg, MD   2 years ago Essential hypertension   [facility] [place], Velna Hatchet, MD   3 years ago Routine general medical examination at a health care facility   [facility] [place], Velna Hatchet, MD   3 years ago Infected sebaceous cyst   [facility] Donita Brooks, MD              Passed - Cr in  normal range and within 360 days    Creat  Date Value Ref Range Status  07/09/2022 0.96 0.50 - 1.03 mg/dL Final   Creatinine, Urine  Date Value Ref Range Status  07/09/2022 166 20 - 275 mg/dL Final

## 2023-01-21 ENCOUNTER — Other Ambulatory Visit: Payer: Self-pay | Admitting: Family Medicine

## 2023-01-21 DIAGNOSIS — E781 Pure hyperglyceridemia: Secondary | ICD-10-CM

## 2023-01-21 DIAGNOSIS — E1165 Type 2 diabetes mellitus with hyperglycemia: Secondary | ICD-10-CM

## 2023-01-27 MED ORDER — LOSARTAN POTASSIUM 25 MG PO TABS: 25.0000 mg | ORAL_TABLET | Freq: Every day | ORAL | 0 refills | Status: DC

## 2023-01-27 MED ORDER — GABAPENTIN 300 MG PO CAPS: ORAL_CAPSULE | ORAL | 0 refills | Status: DC

## 2023-01-28 MED ORDER — SEMAGLUTIDE (1 MG/DOSE) 4 MG/3ML ~~LOC~~ SOPN
1.0000 mg | PEN_INJECTOR | SUBCUTANEOUS | 2 refills | Status: DC
Start: 2023-01-28 — End: 2023-07-10

## 2023-01-28 MED ORDER — ROSUVASTATIN CALCIUM 40 MG PO TABS: 40.0000 mg | ORAL_TABLET | Freq: Every day | ORAL | 1 refills | Status: DC

## 2023-01-28 NOTE — Telephone Encounter (Unsigned)
Copied from CRM [id number]. Topic: Clinical - Medication Refill >> Jan 28, 2023 12:10 PM Prudencio Pair wrote: Most Recent Primary Care Visit:  Provider[facility]  Department[facility]  Visit Type: LAB  Date: 07/09/2022  Medication: Ozempic 1Mg /Dose 4MG /3ML & Rosuvastatin 40mg   Has the patient contacted their pharmacy? Yes, pharmacy stated they have yet to receive refill requests (Agent: If no, request that the patient contact the pharmacy for the refill. If patient does not wish to contact the pharmacy document the reason why and proceed with request.) (Agent: If yes, when and what did the pharmacy advise?)  Is this the correct pharmacy for this prescription? Yes If no, delete pharmacy and type the correct one.  This is the patient's preferred pharmacy:  [facility] [address] [place] - [address] [place] [address] Phone: [phone] Fax: [phone]  [facility] [address] [place] - [address] [place]) [place] [address] Phone: [phone] Fax: [phone]   Has the prescription been filled recently? Yes  Is the patient out of the medication? Yes  Has the patient been seen for an appointment in the last year OR does the patient have an upcoming appointment? Yes  Can we respond through MyChart? Yes  Agent: Please be advised that Rx refills may take up to 3 business days. We ask that you follow-up with your pharmacy.

## 2023-01-28 NOTE — Addendum Note (Signed)
Addended by: Arta Silence on: 01/28/2023 03:32 PM   Modules accepted: Orders

## 2023-03-20 ENCOUNTER — Other Ambulatory Visit: Payer: Self-pay | Admitting: Family Medicine

## 2023-04-27 ENCOUNTER — Other Ambulatory Visit: Payer: Self-pay | Admitting: Family Medicine

## 2023-04-29 NOTE — Telephone Encounter (Signed)
 Requested medications are due for refill today.  yes  Requested medications are on the active medications list.  yes  Last refill. 03/20/2023 #30 0 rf  Future visit scheduled.   no  Notes to clinic.  Pt last seen 04/12/2022. Courtesy refill already given.    Requested Prescriptions  Pending Prescriptions Disp Refills   losartan (COZAAR) 25 MG tablet [Pharmacy Med Name: Losartan Potassium 25 MG Oral Tablet] 30 tablet 0    Sig: Take 1 tablet by mouth once daily     Cardiovascular:  Angiotensin Receptor Blockers Failed - 04/29/2023  9:57 AM      Failed - Cr in normal range and within 180 days    Creat  Date Value Ref Range Status  07/09/2022 0.96 0.50 - 1.03 mg/dL Final   Creatinine, Urine  Date Value Ref Range Status  07/09/2022 166 20 - 275 mg/dL Final         Failed - K in normal range and within 180 days    Potassium  Date Value Ref Range Status  07/09/2022 4.3 3.5 - 5.3 mmol/L Final         Failed - Valid encounter within last 6 months    Recent Outpatient Visits           2 years ago Folliculitis   [facility] Valentino Nose, NP   2 years ago Essential hypertension   [facility] Tanya Nones, Priscille Heidelberg, MD   3 years ago Essential hypertension   [facility] [place], Velna Hatchet, MD   3 years ago Routine general medical examination at a health care facility   [facility] [place], Velna Hatchet, MD   3 years ago Infected sebaceous cyst   [facility] Donita Brooks, MD              Passed - Patient is not pregnant      Passed - Last BP in normal range    BP Readings from Last 1 Encounters:  06/11/22 124/70

## 2023-05-20 ENCOUNTER — Ambulatory Visit: Payer: Self-pay | Admitting: Family Medicine

## 2023-05-20 ENCOUNTER — Encounter: Payer: Self-pay | Admitting: Family Medicine

## 2023-05-20 VITALS — BP 122/70 | HR 73 | Temp 97.8°F | Ht 66.0 in | Wt 205.0 lb

## 2023-05-20 DIAGNOSIS — Z23 Encounter for immunization: Secondary | ICD-10-CM | POA: Diagnosis not present

## 2023-05-20 DIAGNOSIS — K589 Irritable bowel syndrome without diarrhea: Secondary | ICD-10-CM | POA: Insufficient documentation

## 2023-05-20 DIAGNOSIS — Z1211 Encounter for screening for malignant neoplasm of colon: Secondary | ICD-10-CM | POA: Insufficient documentation

## 2023-05-20 DIAGNOSIS — Z794 Long term (current) use of insulin: Secondary | ICD-10-CM | POA: Diagnosis not present

## 2023-05-20 DIAGNOSIS — K625 Hemorrhage of anus and rectum: Secondary | ICD-10-CM | POA: Insufficient documentation

## 2023-05-20 DIAGNOSIS — R933 Abnormal findings on diagnostic imaging of other parts of digestive tract: Secondary | ICD-10-CM | POA: Insufficient documentation

## 2023-05-20 DIAGNOSIS — E1149 Type 2 diabetes mellitus with other diabetic neurological complication: Secondary | ICD-10-CM | POA: Diagnosis not present

## 2023-05-20 DIAGNOSIS — D2239 Melanocytic nevi of other parts of face: Secondary | ICD-10-CM | POA: Insufficient documentation

## 2023-05-20 DIAGNOSIS — K529 Noninfective gastroenteritis and colitis, unspecified: Secondary | ICD-10-CM | POA: Insufficient documentation

## 2023-05-20 DIAGNOSIS — L738 Other specified follicular disorders: Secondary | ICD-10-CM | POA: Insufficient documentation

## 2023-05-20 DIAGNOSIS — K603 Anal fistula, unspecified: Secondary | ICD-10-CM | POA: Insufficient documentation

## 2023-05-20 DIAGNOSIS — R197 Diarrhea, unspecified: Secondary | ICD-10-CM | POA: Insufficient documentation

## 2023-05-20 DIAGNOSIS — R141 Gas pain: Secondary | ICD-10-CM | POA: Insufficient documentation

## 2023-05-20 NOTE — Progress Notes (Signed)
 Subjective:    Patient ID: Alexandra Christensen, female    DOB: [birth year], 53 y.o.   MRN: [id number]  HPI Patient has a history of type 2 diabetes.  She continues to complain of neuropathy in her feet.  Diabetic foot exam was performed today and shows excellent pulses but she does have diminished sensation to 10 g monofilament in some of her toes.  She denies any chest pain.  Denies shortness of breath.  She denies any blurry vision.  She does have polyuria.  She denies any dysuria.  She is not checking her blood sugar.  She is due for mammogram.  She is also due for a booster of her pneumonia vaccine.  Her blood pressure today is well-controlled at 122/70. 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,  pt had TEE done 06-16-2013 normal;  (06-30-2020  no issue since and told unknown cause)   Type 2 diabetes mellitus (HCC)    followed by pcp   (06-29-2020 pt stated does not check blood sugar at home)   Past Surgical History:  Procedure Laterality Date   ANAL FISTULOTOMY N/A 11/03/2019   Procedure: PARTIAL ANAL FISTULOTOMY;  Surgeon: Andria Meuse, MD;  Location: [facility];  Service: General;  Laterality: N/A;   BREAST LUMPECTOMY WITH RADIOACTIVE SEED LOCALIZATION Right 11/21/2021   Procedure: RIGHT BREAST LUMPECTOMY WITH RADIOACTIVE SEED LOCALIZATION;  Surgeon: Harriette Bouillon, MD;  Location: [facility];  Service: General;  Laterality: Right;   COLONOSCOPY W/ POLYPECTOMY  10/05/2020   two small polyps in the tranverse colon   INCISION AND  DRAINAGE ABSCESS N/A 07/05/2020   Procedure: INCISION AND DRAINAGE PERIRECTAL ABSCESS & PLACEMENT OF SETON;  Surgeon: Andria Meuse, MD;  Location: [facility];  Service: General;  Laterality: N/A;   INCISION AND DRAINAGE ABSCESS N/A 04/23/2021   Procedure: INCISION AND DRAINAGE OF PERIRECTAL ABSCESS;  Surgeon: Andria Meuse, MD;  Location: [facility];  Service: General;  Laterality: N/A;   INCISION AND DRAINAGE ABSCESS ANAL     PLACEMENT OF SETON N/A 04/23/2021   Procedure: PLACEMENT OF SETONS TIMES TWO;  Surgeon: Andria Meuse, MD;  Location: [facility];  Service: General;  Laterality: N/A;   RECTAL EXAM UNDER ANESTHESIA N/A 11/03/2019   Procedure: RECTAL EXAM UNDER ANESTHESIA;  Surgeon: Andria Meuse, MD;  Location: [facility];  Service: General;  Laterality: N/A;   RECTAL EXAM UNDER ANESTHESIA N/A 04/23/2021   Procedure: ANORECTAL EXAM UNDER ANESTHESIA;  Surgeon: Andria Meuse, MD;  Location: [facility];  Service: General;  Laterality: N/A;   TEE WITHOUT CARDIOVERSION N/A 06/16/2013   Procedure: TRANSESOPHAGEAL ECHOCARDIOGRAM (TEE);  Surgeon: Chrystie Nose, MD;  Location: [facility];  Service: Cardiovascular;  Laterality: N/A;   WISDOM TOOTH EXTRACTION     Current Outpatient Medications on File Prior to Visit  Medication Sig  Dispense Refill   ALPRAZolam (XANAX) 1 MG tablet TAKE 1 TABLET BY MOUTH AT BEDTIME AS NEEDED FOR SLEEP 60 tablet 0   aluminum chloride (DRYSOL) 20 % external solution Apply topically at bedtime. 35 mL 0   amoxicillin-clavulanate (AUGMENTIN) 875-125 MG tablet Take 1 tablet by mouth 2 (two) times daily. 20 tablet 0   azithromycin (ZITHROMAX) 250 MG tablet 2 tabs poqday1, 1 tab poqday 2-5 6 tablet 0   Blood Glucose Monitoring Suppl (BLOOD GLUCOSE SYSTEM PAK) KIT Dispense based on patient and insurance preference. Use to monitor FSBS 1x daily. Dx: E11.9 1 each 1   gabapentin  (NEURONTIN) 300 MG capsule TAKE 1 CAPSULE BY MOUTH IN THE MORNING AND 2 CAPSULES AT BEDTIME 90 capsule 0   glucose blood (ONE TOUCH ULTRA TEST) test strip USE TO CHECK FASTING BLOOD GLUCOSE ONCE DAILY 50 each 3   ibuprofen (ADVIL) 800 MG tablet Take 1 tablet (800 mg total) by mouth every 8 (eight) hours as needed. 90 tablet 0   ibuprofen (ADVIL) 800 MG tablet Take 1 tablet (800 mg total) by mouth every 8 (eight) hours as needed. 30 tablet 0   ibuprofen (ADVIL) 800 MG tablet TAKE 1 TABLET BY MOUTH EVERY 8 HOURS AS NEEDED 90 tablet 0   Lancet Devices MISC Dispense based on patient and insurance preference. Use to monitor FSBS 1x daily. Dx: E11.9 1 each 1   Lancets MISC Dispense based on patient and insurance preference. Use to monitor FSBS 1x daily. Dx: E11.9 100 each 1   losartan (COZAAR) 25 MG tablet Take 1 tablet by mouth once daily 30 tablet 0   oxyCODONE (OXY IR/ROXICODONE) 5 MG immediate release tablet Take 1 tablet (5 mg total) by mouth every 6 (six) hours as needed for severe pain. 15 tablet 0   rosuvastatin (CRESTOR) 40 MG tablet Take 1 tablet (40 mg total) by mouth daily. 90 tablet 1   Semaglutide, 1 MG/DOSE, 4 MG/3ML SOPN Inject 1 mg as directed once a week. 3 mL 2   tamsulosin (FLOMAX) 0.4 MG CAPS capsule TAKE 1 CAPSULE BY MOUTH EVERY DAY 90 capsule 1   vitamin B-12 (CYANOCOBALAMIN) 100 MCG tablet Take 100 mcg by mouth daily. Takes a couple of days a week, unsure of dosage.     No current facility-administered medications on file prior to visit.   Allergies  Allergen Reactions   Adhesive [Tape] Rash    Social History   Socioeconomic History   Marital status: Single    Spouse name: Not on file   Number of children: Not on file   Years of education: Not on file   Highest education level: Not on file  Occupational History   Not on file  Tobacco Use   Smoking status: Every Day    Current packs/day: 0.50    Average packs/day: 0.5 packs/day for 30.0 years (15.0 ttl pk-yrs)     Types: Cigarettes   Smokeless tobacco: Never   Tobacco comments:    06-30-2020  pt stated 3ppwk, 04/18/21 pt stated about 6 cigarettes per day  Vaping Use   Vaping status: Never Used  Substance and Sexual Activity   Alcohol use: Not Currently    Comment: due to GI issues   Drug use: No   Sexual activity: Yes    Birth control/protection: None  Other Topics Concern   Not on file  Social History Narrative   Not on file   Social Drivers of Health   Financial Resource Strain: Not  on file  Food Insecurity: Not on file  Transportation Needs: Not on file  Physical Activity: Not on file  Stress: Not on file  Social Connections: Not on file  Intimate Partner Violence: Not on file   Family History  Problem Relation Age of Onset   Diabetes Mother    Heart disease Mother    Kidney disease Father    Heart disease Father    Stroke Sister 31   Cancer Sister        AML      Review of Systems  All other systems reviewed and are negative.      Objective:   Physical Exam Vitals reviewed.  Cardiovascular:     Rate and Rhythm: Normal rate and regular rhythm.     Heart sounds: Normal heart sounds.  Pulmonary:     Effort: Pulmonary effort is normal. No respiratory distress.     Breath sounds: Normal breath sounds. No stridor. No wheezing or rales.  Abdominal:     General: Bowel sounds are normal. There is no distension.     Palpations: Abdomen is soft. There is no mass.     Tenderness: There is no abdominal tenderness. There is no guarding or rebound.           Assessment & Plan:  Type 2 diabetes mellitus with neurological complications (HCC) - Plan: Hemoglobin A1c, CBC with Differential/Platelet, COMPLETE METABOLIC PANEL WITHOUT GFR, Lipid panel, Microalbumin/Creatinine Ratio, Urine  Need for vaccination - Plan: Pneumococcal conjugate vaccine 20-valent (Prevnar 20) Patient prefers to schedule her mammogram.  Pap smear is up-to-date.  Colonoscopy is up-to-date and is due  again in 2029.  Patient received Prevnar 20 today.  She defers the shingles vaccine.  I will check a hemoglobin A1c.  Goal hemoglobin A1c is less than 6.5.  I will check a fasting lipid panel.  LDL cholesterol less than 433.  Blood pressure is at goal

## 2023-05-21 LAB — CBC WITH DIFFERENTIAL/PLATELET
Absolute Lymphocytes: 2831 {cells}/uL (ref 850–3900)
Absolute Monocytes: 544 {cells}/uL (ref 200–950)
Basophils Absolute: 100 {cells}/uL (ref 0–200)
Basophils Relative: 0.9 %
Eosinophils Absolute: 322 {cells}/uL (ref 15–500)
Eosinophils Relative: 2.9 %
HCT: 46.5 % — ABNORMAL HIGH (ref 35.0–45.0)
Hemoglobin: 15.7 g/dL — ABNORMAL HIGH (ref 11.7–15.5)
MCH: 30.8 pg (ref 27.0–33.0)
MCHC: 33.8 g/dL (ref 32.0–36.0)
MCV: 91.2 fL (ref 80.0–100.0)
MPV: 11.8 fL (ref 7.5–12.5)
Monocytes Relative: 4.9 %
Neutro Abs: 7304 {cells}/uL (ref 1500–7800)
Neutrophils Relative %: 65.8 %
Platelets: 298 10*3/uL (ref 140–400)
RBC: 5.1 10*6/uL (ref 3.80–5.10)
RDW: 12.8 % (ref 11.0–15.0)
Total Lymphocyte: 25.5 %
WBC: 11.1 10*3/uL — ABNORMAL HIGH (ref 3.8–10.8)

## 2023-05-21 LAB — COMPLETE METABOLIC PANEL WITHOUT GFR
AG Ratio: 1.6 (calc) (ref 1.0–2.5)
ALT: 11 U/L (ref 6–29)
AST: 12 U/L (ref 10–35)
Albumin: 4.1 g/dL (ref 3.6–5.1)
Alkaline phosphatase (APISO): 92 U/L (ref 37–153)
BUN: 13 mg/dL (ref 7–25)
CO2: 27 mmol/L (ref 20–32)
Calcium: 9.6 mg/dL (ref 8.6–10.4)
Chloride: 104 mmol/L (ref 98–110)
Creat: 0.97 mg/dL (ref 0.50–1.03)
Globulin: 2.6 g/dL (ref 1.9–3.7)
Glucose, Bld: 116 mg/dL — ABNORMAL HIGH (ref 65–99)
Potassium: 4 mmol/L (ref 3.5–5.3)
Sodium: 140 mmol/L (ref 135–146)
Total Bilirubin: 0.3 mg/dL (ref 0.2–1.2)
Total Protein: 6.7 g/dL (ref 6.1–8.1)

## 2023-05-21 LAB — LIPID PANEL
Cholesterol: 122 mg/dL (ref <200)
HDL: 47 mg/dL — ABNORMAL LOW (ref >=50)
LDL Cholesterol (Calc): 52 mg/dL
Non-HDL Cholesterol (Calc): 75 mg/dL (ref <130)
Total CHOL/HDL Ratio: 2.6 (calc) (ref <5.0)
Triglycerides: 148 mg/dL (ref <150)

## 2023-05-21 LAB — MICROALBUMIN / CREATININE URINE RATIO
Creatinine, Urine: 262 mg/dL (ref 20–275)
Microalb Creat Ratio: 10 mg/g{creat} (ref <30)
Microalb, Ur: 2.5 mg/dL

## 2023-05-21 LAB — HEMOGLOBIN A1C
Hgb A1c MFr Bld: 6 %{Hb} — ABNORMAL HIGH (ref <5.7)
Mean Plasma Glucose: 126 mg/dL
eAG (mmol/L): 7 mmol/L

## 2023-05-30 ENCOUNTER — Other Ambulatory Visit: Payer: Self-pay | Admitting: Family Medicine

## 2023-05-30 NOTE — Telephone Encounter (Signed)
 Requested Prescriptions  Pending Prescriptions Disp Refills   losartan (COZAAR) 25 MG tablet [Pharmacy Med Name: Losartan Potassium 25 MG Oral Tablet] 90 tablet 1    Sig: Take 1 tablet by mouth once daily     Cardiovascular:  Angiotensin Receptor Blockers Failed - 05/30/2023  5:14 PM      Failed - Valid encounter within last 6 months    Recent Outpatient Visits           1 week ago Type 2 diabetes mellitus with neurological complications [facility])   [place] [facility] Donita Brooks, MD   11 months ago Community acquired bacterial pneumonia   [facility] Pickard, Priscille Heidelberg, MD   12 months ago Other acute back pain   [facility] Donita Brooks, MD   1 year ago Type 2 diabetes mellitus with neurological complications [facility])   [place] [facility] Donita Brooks, MD   1 year ago Acute otitis media, unspecified otitis media type   [place] [facility] Park Meo, FNP              Passed - Cr in normal range and within 180 days    Creat  Date Value Ref Range Status  05/20/2023 0.97 0.50 - 1.03 mg/dL Final   Creatinine, Urine  Date Value Ref Range Status  05/20/2023 262 20 - 275 mg/dL Final         Passed - K in normal range and within 180 days    Potassium  Date Value Ref Range Status  05/20/2023 4.0 3.5 - 5.3 mmol/L Final         Passed - Patient is not pregnant      Passed - Last BP in normal range    BP Readings from Last 1 Encounters:  05/20/23 122/70

## 2023-07-08 ENCOUNTER — Other Ambulatory Visit: Payer: Self-pay | Admitting: Family Medicine

## 2023-07-08 DIAGNOSIS — E1165 Type 2 diabetes mellitus with hyperglycemia: Secondary | ICD-10-CM

## 2023-07-10 NOTE — Telephone Encounter (Signed)
 Requested Prescriptions  Pending Prescriptions Disp Refills   Semaglutide , 1 MG/DOSE, (OZEMPIC , 1 MG/DOSE,) 4 MG/3ML SOPN [Pharmacy Med Name: OZEMPIC  4 MG/3 ML (1 MG/DOSE)] 2 mL 2    Sig: INJECT 1 MG ONCE A WEEK AS DIRECTED     Endocrinology:  Diabetes - GLP-1 Receptor Agonists - semaglutide  Failed - 07/10/2023 11:33 AM      Failed - HBA1C in normal range and within 180 days    Hgb A1c MFr Bld  Date Value Ref Range Status  05/20/2023 6.0 (H) <5.7 % of total Hgb Final    Comment:    For someone without known diabetes, a hemoglobin  A1c value between 5.7% and 6.4% is consistent with prediabetes and should be confirmed with a  follow-up test. . For someone with known diabetes, a value <7% indicates that their diabetes is well controlled. A1c targets should be individualized based on duration of diabetes, age, comorbid conditions, and other considerations. . This assay result is consistent with an increased risk of diabetes. . Currently, no consensus exists regarding use of hemoglobin A1c for diagnosis of diabetes for children. .          Failed - Valid encounter within last 6 months    Recent Outpatient Visits           1 month ago Type 2 diabetes mellitus with neurological complications [facility])   [place] [facility] Pickard, Cisco Crest, MD   1 year ago Community acquired bacterial pneumonia   [facility] Pickard, Cisco Crest, MD   1 year ago Other acute back pain   [place] [facility] Austine Lefort, MD   1 year ago Type 2 diabetes mellitus with neurological complications [facility])   [place] [facility] Austine Lefort, MD   1 year ago Acute otitis media, unspecified otitis media type   [place] [facility] Jenelle Mis, FNP              Passed - Cr in normal range and within 360 days    Creat  Date Value Ref Range Status  05/20/2023 0.97 0.50 - 1.03 mg/dL  Final   Creatinine, Urine  Date Value Ref Range Status  05/20/2023 262 20 - 275 mg/dL Final

## 2023-12-18 ENCOUNTER — Other Ambulatory Visit: Payer: Self-pay | Admitting: Family Medicine

## 2023-12-18 DIAGNOSIS — E1165 Type 2 diabetes mellitus with hyperglycemia: Secondary | ICD-10-CM

## 2024-02-27 ENCOUNTER — Telehealth: Payer: Self-pay

## 2024-02-27 NOTE — Telephone Encounter (Signed)
 Pt scheduled for labs as requested.

## 2024-02-27 NOTE — Telephone Encounter (Signed)
 Copied from CRM #[id number]. Topic: Clinical - Request for Lab/Test Order >> Feb 27, 2024 12:07 PM Montie POUR wrote: Reason for CRM:  Ms. Cantrall would like to have labs complete on 03/08/24. No orders are in her chart. Please call her at [phone] to make appointment after lab orders are in her chart.

## 2024-03-08 ENCOUNTER — Other Ambulatory Visit: Payer: Self-pay | Admitting: Family Medicine

## 2024-03-08 ENCOUNTER — Other Ambulatory Visit

## 2024-03-08 DIAGNOSIS — I10 Essential (primary) hypertension: Secondary | ICD-10-CM

## 2024-03-08 DIAGNOSIS — E781 Pure hyperglyceridemia: Secondary | ICD-10-CM

## 2024-03-08 DIAGNOSIS — D72829 Elevated white blood cell count, unspecified: Secondary | ICD-10-CM

## 2024-03-08 DIAGNOSIS — E1165 Type 2 diabetes mellitus with hyperglycemia: Secondary | ICD-10-CM

## 2024-03-08 DIAGNOSIS — R310 Gross hematuria: Secondary | ICD-10-CM

## 2024-03-09 ENCOUNTER — Ambulatory Visit: Payer: Self-pay | Admitting: Family Medicine

## 2024-03-09 LAB — COMPLETE METABOLIC PANEL WITHOUT GFR
AG Ratio: 1.4 (calc) (ref 1.0–2.5)
ALT: 8 U/L (ref 6–29)
AST: 11 U/L (ref 10–35)
Albumin: 3.9 g/dL (ref 3.6–5.1)
Alkaline phosphatase (APISO): 100 U/L (ref 37–153)
BUN: 15 mg/dL (ref 7–25)
CO2: 30 mmol/L (ref 20–32)
Calcium: 9.2 mg/dL (ref 8.6–10.4)
Chloride: 104 mmol/L (ref 98–110)
Creat: 1 mg/dL (ref 0.50–1.03)
Globulin: 2.7 g/dL (ref 1.9–3.7)
Glucose, Bld: 109 mg/dL — ABNORMAL HIGH (ref 65–99)
Potassium: 4.2 mmol/L (ref 3.5–5.3)
Sodium: 141 mmol/L (ref 135–146)
Total Bilirubin: 0.4 mg/dL (ref 0.2–1.2)
Total Protein: 6.6 g/dL (ref 6.1–8.1)

## 2024-03-09 LAB — CBC WITH DIFFERENTIAL/PLATELET
Absolute Lymphocytes: 2683 {cells}/uL (ref 850–3900)
Absolute Monocytes: 490 {cells}/uL (ref 200–950)
Basophils Absolute: 69 {cells}/uL (ref 0–200)
Basophils Relative: 0.8 %
Eosinophils Absolute: 464 {cells}/uL (ref 15–500)
Eosinophils Relative: 5.4 %
HCT: 45.5 % (ref 35.9–46.0)
Hemoglobin: 14.9 g/dL (ref 11.7–15.5)
MCH: 29.8 pg (ref 27.0–33.0)
MCHC: 32.7 g/dL (ref 31.6–35.4)
MCV: 91 fL (ref 81.4–101.7)
MPV: 11.8 fL (ref 7.5–12.5)
Monocytes Relative: 5.7 %
Neutro Abs: 4893 {cells}/uL (ref 1500–7800)
Neutrophils Relative %: 56.9 %
Platelets: 299 Thousand/uL (ref 140–400)
RBC: 5 Million/uL (ref 3.80–5.10)
RDW: 12.7 % (ref 11.0–15.0)
Total Lymphocyte: 31.2 %
WBC: 8.6 Thousand/uL (ref 3.8–10.8)

## 2024-03-09 LAB — LIPID PANEL
Cholesterol: 193 mg/dL
HDL: 47 mg/dL — ABNORMAL LOW
LDL Cholesterol (Calc): 121 mg/dL — ABNORMAL HIGH
Non-HDL Cholesterol (Calc): 146 mg/dL — ABNORMAL HIGH
Total CHOL/HDL Ratio: 4.1 (calc)
Triglycerides: 137 mg/dL

## 2024-03-09 LAB — VITAMIN B12: Vitamin B-12: 461 pg/mL (ref 200–1100)

## 2024-03-09 LAB — HEMOGLOBIN A1C
Hgb A1c MFr Bld: 6 % — ABNORMAL HIGH
Mean Plasma Glucose: 126 mg/dL
eAG (mmol/L): 7 mmol/L

## 2024-03-11 ENCOUNTER — Encounter: Payer: Self-pay | Admitting: Family Medicine

## 2024-03-11 ENCOUNTER — Ambulatory Visit: Admitting: Family Medicine

## 2024-03-11 VITALS — BP 134/76 | HR 84 | Temp 98.4°F | Ht 66.0 in | Wt 213.0 lb

## 2024-03-11 DIAGNOSIS — E7800 Pure hypercholesterolemia, unspecified: Secondary | ICD-10-CM | POA: Diagnosis not present

## 2024-03-11 DIAGNOSIS — I2510 Atherosclerotic heart disease of native coronary artery without angina pectoris: Secondary | ICD-10-CM

## 2024-03-11 DIAGNOSIS — Z7985 Long-term (current) use of injectable non-insulin antidiabetic drugs: Secondary | ICD-10-CM | POA: Diagnosis not present

## 2024-03-11 DIAGNOSIS — I152 Hypertension secondary to endocrine disorders: Secondary | ICD-10-CM | POA: Diagnosis not present

## 2024-03-11 DIAGNOSIS — E1159 Type 2 diabetes mellitus with other circulatory complications: Secondary | ICD-10-CM

## 2024-03-11 MED ORDER — VARENICLINE TARTRATE (STARTER) 0.5 MG X 11 & 1 MG X 42 PO TBPK: ORAL_TABLET | ORAL | 1 refills | Status: AC

## 2024-03-11 NOTE — Progress Notes (Signed)
 "  Subjective:    Patient ID: Alexandra Christensen, female    DOB: 1970/11/15, 54 y.o.   MRN: [id number]  HPI Patient has a history of type 2 diabetes.  She also has a history of coronary artery disease.  On the CT scan in 2024 she was found to have moderate coronary artery calcifications in her LAD as well as in her left circumflex.  For that reason I have her on maximum dose statin Crestor  40 mg a day.  She has not been taking the statin.  She continues to smoke.  Her blood pressure is well-controlled at 134/76.  Her most recent lab work as listed below but is off statin therapy Lab on 03/08/2024  Component Date Value Ref Range Status   WBC 03/08/2024 8.6  3.8 - 10.8 Thousand/uL Final   RBC 03/08/2024 5.00  3.80 - 5.10 Million/uL Final   Hemoglobin 03/08/2024 14.9  11.7 - 15.5 g/dL Final   HCT 98/80/7973 45.5  35.9 - 46.0 % Final   MCV 03/08/2024 91.0  81.4 - 101.7 fL Final   MCH 03/08/2024 29.8  27.0 - 33.0 pg Final   MCHC 03/08/2024 32.7  31.6 - 35.4 g/dL Final   RDW 98/80/7973 12.7  11.0 - 15.0 % Final   Platelets 03/08/2024 299  140 - 400 Thousand/uL Final   MPV 03/08/2024 11.8  7.5 - 12.5 fL Final   Neutro Abs 03/08/2024 4,893  1,500 - 7,800 cells/uL Final   Absolute Lymphocytes 03/08/2024 2,683  850 - 3,900 cells/uL Final   Absolute Monocytes 03/08/2024 490  200 - 950 cells/uL Final   Eosinophils Absolute 03/08/2024 464  15 - 500 cells/uL Final   Basophils Absolute 03/08/2024 69  0 - 200 cells/uL Final   Neutrophils Relative % 03/08/2024 56.9  % Final   Total Lymphocyte 03/08/2024 31.2  % Final   Monocytes Relative 03/08/2024 5.7  % Final   Eosinophils Relative 03/08/2024 5.4  % Final   Basophils Relative 03/08/2024 0.8  % Final   Glucose, Bld 03/08/2024 109 (H)  65 - 99 mg/dL Final   Comment: .            Fasting reference interval . For someone without known diabetes, a glucose value between 100 and 125 mg/dL is consistent with prediabetes and should be confirmed with  a follow-up test. .    BUN 03/08/2024 15  7 - 25 mg/dL Final   Creat 98/80/7973 1.00  0.50 - 1.03 mg/dL Final   BUN/Creatinine Ratio 03/08/2024 SEE NOTE:  6 - 22 (calc) Final   Comment:    Not Reported: BUN and Creatinine are within    reference range. .    Sodium 03/08/2024 141  135 - 146 mmol/L Final   Potassium 03/08/2024 4.2  3.5 - 5.3 mmol/L Final   Chloride 03/08/2024 104  98 - 110 mmol/L Final   CO2 03/08/2024 30  20 - 32 mmol/L Final   Calcium  03/08/2024 9.2  8.6 - 10.4 mg/dL Final   Total Protein 98/80/7973 6.6  6.1 - 8.1 g/dL Final   Albumin 98/80/7973 3.9  3.6 - 5.1 g/dL Final   Globulin 98/80/7973 2.7  1.9 - 3.7 g/dL (calc) Final   AG Ratio 03/08/2024 1.4  1.0 - 2.5 (calc) Final   Total Bilirubin 03/08/2024 0.4  0.2 - 1.2 mg/dL Final   Alkaline phosphatase (APISO) 03/08/2024 100  37 - 153 U/L Final   AST 03/08/2024 11  10 - 35 U/L Final  ALT 03/08/2024 8  6 - 29 U/L Final   Hgb A1c MFr Bld 03/08/2024 6.0 (H)  <5.7 % Final   Comment: For someone without known diabetes, a hemoglobin  A1c value between 5.7% and 6.4% is consistent with prediabetes and should be confirmed with a  follow-up test. . For someone with known diabetes, a value <7% indicates that their diabetes is well controlled. A1c targets should be individualized based on duration of diabetes, age, comorbid conditions, and other considerations. . This assay result is consistent with an increased risk of diabetes. . Currently, no consensus exists regarding use of hemoglobin A1c for diagnosis of diabetes for children. .    Mean Plasma Glucose 03/08/2024 126  mg/dL Final   eAG (mmol/L) 98/80/7973 7.0  mmol/L Final   Cholesterol 03/08/2024 193  <200 mg/dL Final   HDL 98/80/7973 47 (L)  > OR = 50 mg/dL Final   Triglycerides 98/80/7973 137  <150 mg/dL Final   LDL Cholesterol (Calc) 03/08/2024 121 (H)  mg/dL (calc) Final   Comment: Reference range: <100 . Desirable range <100 mg/dL for primary  prevention;   <70 mg/dL for patients with CHD or diabetic patients  with > or = 2 CHD risk factors. SABRA LDL-C is now calculated using the Martin-Hopkins  calculation, which is a validated novel method providing  better accuracy than the Friedewald equation in the  estimation of LDL-C.  Gladis APPLETHWAITE et al. SANDREA. 7986;689(80): 2061-2068  (http://education.[employer].com/faq/FAQ164)    Total CHOL/HDL Ratio 03/08/2024 4.1  <4.9 (calc) Final   Non-HDL Cholesterol (Calc) 03/08/2024 146 (H)  <130 mg/dL (calc) Final   Comment: For patients with diabetes plus 1 major ASCVD risk  factor, treating to a non-HDL-C goal of <100 mg/dL  (LDL-C of <29 mg/dL) is considered a therapeutic  option.    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,  pt had TEE done 06-16-2013 normal;  (06-30-2020  no issue since and told unknown cause)   Type 2 diabetes mellitus (HCC)    followed by pcp   (06-29-2020 pt stated does not check blood sugar at home)   Past Surgical History:  Procedure Laterality Date   ANAL FISTULOTOMY N/A 11/03/2019   Procedure: PARTIAL ANAL FISTULOTOMY;  Surgeon: Teresa Lonni HERO, MD;  Location: [facility];  Service: General;  Laterality: N/A;   BREAST LUMPECTOMY WITH RADIOACTIVE SEED LOCALIZATION Right 11/21/2021   Procedure: RIGHT BREAST LUMPECTOMY WITH RADIOACTIVE SEED LOCALIZATION;  Surgeon: Vanderbilt Ned, MD;  Location: [facility];  Service: General;  Laterality: Right;    COLONOSCOPY W/ POLYPECTOMY  10/05/2020   two small polyps in the tranverse colon   INCISION AND DRAINAGE ABSCESS N/A 07/05/2020   Procedure: INCISION AND DRAINAGE PERIRECTAL ABSCESS & PLACEMENT OF SETON;  Surgeon: Teresa Lonni HERO, MD;  Location: [facility];  Service: General;  Laterality: N/A;   INCISION AND DRAINAGE ABSCESS N/A 04/23/2021   Procedure: INCISION AND DRAINAGE OF PERIRECTAL ABSCESS;  Surgeon: Teresa Lonni HERO, MD;  Location: [facility];  Service: General;  Laterality: N/A;   INCISION AND DRAINAGE ABSCESS ANAL     PLACEMENT OF SETON N/A 04/23/2021   Procedure: PLACEMENT OF SETONS TIMES TWO;  Surgeon: Teresa Lonni HERO, MD;  Location: [facility];  Service: General;  Laterality: N/A;   RECTAL EXAM UNDER ANESTHESIA N/A 11/03/2019   Procedure: RECTAL EXAM UNDER ANESTHESIA;  Surgeon: Teresa Lonni HERO, MD;  Location: [facility];  Service: General;  Laterality: N/A;   RECTAL EXAM UNDER ANESTHESIA N/A 04/23/2021   Procedure: ANORECTAL EXAM UNDER ANESTHESIA;  Surgeon: Teresa Lonni HERO, MD;  Location: [facility];  Service: General;  Laterality: N/A;   TEE WITHOUT CARDIOVERSION N/A 06/16/2013   Procedure: TRANSESOPHAGEAL ECHOCARDIOGRAM (TEE);  Surgeon: Vinie KYM Maxcy, MD;  Location: [facility];  Service: Cardiovascular;  Laterality: N/A;   WISDOM TOOTH EXTRACTION     Current Outpatient Medications on File Prior to Visit  Medication Sig Dispense Refill   ALPRAZolam  (XANAX ) 1 MG tablet TAKE 1 TABLET BY MOUTH AT BEDTIME AS NEEDED FOR SLEEP 60 tablet 0   aluminum chloride (DRYSOL) 20 % external solution Apply topically at bedtime. 35 mL 0   amoxicillin -clavulanate (AUGMENTIN ) 875-125 MG tablet Take 1 tablet by mouth 2 (two) times daily. 20 tablet 0   azithromycin  (ZITHROMAX ) 250 MG tablet 2 tabs poqday1, 1 tab poqday 2-5 6 tablet 0   Blood Glucose Monitoring Suppl (BLOOD GLUCOSE SYSTEM PAK) KIT Dispense based on  patient and insurance preference. Use to monitor FSBS 1x daily. Dx: E11.9 1 each 1   gabapentin  (NEURONTIN ) 300 MG capsule TAKE 1 CAPSULE BY MOUTH IN THE MORNING AND 2 CAPSULES AT BEDTIME 270 capsule 0   glucose blood (ONE TOUCH ULTRA TEST) test strip USE TO CHECK FASTING BLOOD GLUCOSE ONCE DAILY 50 each 3   ibuprofen  (ADVIL ) 800 MG tablet Take 1 tablet (800 mg total) by mouth every 8 (eight) hours as needed. 90 tablet 0   ibuprofen  (ADVIL ) 800 MG tablet Take 1 tablet (800 mg total) by mouth every 8 (eight) hours as needed. 30 tablet 0   ibuprofen  (ADVIL ) 800 MG tablet TAKE 1 TABLET BY MOUTH EVERY 8 HOURS AS NEEDED 90 tablet 0   Lancet Devices MISC Dispense based on patient and insurance preference. Use to monitor FSBS 1x daily. Dx: E11.9 1 each 1   Lancets MISC Dispense based on patient and insurance preference. Use to monitor FSBS 1x daily. Dx: E11.9 100 each 1   losartan  (COZAAR ) 25 MG tablet Take 1 tablet by mouth once daily 90 tablet 1   oxyCODONE  (OXY IR/ROXICODONE ) 5 MG immediate release tablet Take 1 tablet (5 mg total) by mouth every 6 (six) hours as needed for severe pain. 15 tablet 0   OZEMPIC , 1 MG/DOSE, 4 MG/3ML SOPN INJECT 1 MG UNDER THE SKIN ONCE WEEKLY AS DIRECTED 3 mL 0   rosuvastatin  (CRESTOR ) 40 MG tablet Take 1 tablet by mouth once daily 90 tablet 0   vitamin B-12 (CYANOCOBALAMIN) 100 MCG tablet Take 100 mcg by mouth daily. Takes a couple of days a week, unsure of dosage.     tamsulosin  (FLOMAX ) 0.4 MG CAPS capsule TAKE 1 CAPSULE BY MOUTH EVERY DAY (Patient not taking: Reported on 03/11/2024) 90 capsule 1   No current facility-administered medications on file  prior to visit.   Allergies  Allergen Reactions   Adhesive [Tape] Rash    Social History   Socioeconomic History   Marital status: Single    Spouse name: Not on file   Number of children: Not on file   Years of education: Not on file   Highest education level: Not on file  Occupational History   Not on file   Tobacco Use   Smoking status: Every Day    Current packs/day: 0.50    Average packs/day: 0.5 packs/day for 30.0 years (15.0 ttl pk-yrs)    Types: Cigarettes   Smokeless tobacco: Never   Tobacco comments:    06-30-2020  pt stated 3ppwk, 04/18/21 pt stated about 6 cigarettes per day  Vaping Use   Vaping status: Never Used  Substance and Sexual Activity   Alcohol use: Not Currently    Comment: due to GI issues   Drug use: No   Sexual activity: Yes    Birth control/protection: None  Other Topics Concern   Not on file  Social History Narrative   Not on file   Social Drivers of Health   Tobacco Use: High Risk (03/11/2024)   Patient History    Smoking Tobacco Use: Every Day    Smokeless Tobacco Use: Never    Passive Exposure: Not on file  Financial Resource Strain: Not on file  Food Insecurity: Not on file  Transportation Needs: Not on file  Physical Activity: Not on file  Stress: Not on file  Social Connections: Not on file  Intimate Partner Violence: Not on file  Depression (PHQ2-9): Low Risk (05/20/2023)   Depression (PHQ2-9)    PHQ-2 Score: 0  Alcohol Screen: Not on file  Housing: Not on file  Utilities: Not on file  Health Literacy: Not on file   Family History  Problem Relation Age of Onset   Diabetes Mother    Heart disease Mother    Kidney disease Father    Heart disease Father    Stroke Sister 73   Cancer Sister        AML      Review of Systems  All other systems reviewed and are negative.      Objective:   Physical Exam Vitals reviewed.  Cardiovascular:     Rate and Rhythm: Normal rate and regular rhythm.     Heart sounds: Normal heart sounds.  Pulmonary:     Effort: Pulmonary effort is normal. No respiratory distress.     Breath sounds: Normal breath sounds. No stridor. No wheezing or rales.  Abdominal:     General: Bowel sounds are normal. There is no distension.     Palpations: Abdomen is soft. There is no mass.     Tenderness: There is no  abdominal tenderness. There is no guarding or rebound.           Assessment & Plan:  Coronary artery disease involving native coronary artery of native heart without angina pectoris  Pure hypercholesterolemia  Hypertension associated with type 2 diabetes mellitus (HCC) Blood pressure is adequately controlled.  Resume Crestor  40 mg a day and recheck cholesterol in 3 months.  I want her cholesterol to be less than 70.  A1c is acceptable at 6.0.  Biggest concern I have for the patient is for smoking.  Recommended smoking cessation.  Started the patient on Chantix  starter pack and recommended she follow-up in 1 month to determine if she wants to proceed to a continuing dose pack.  "

## 2024-06-10 ENCOUNTER — Ambulatory Visit: Admitting: Family Medicine
